# Patient Record
Sex: Female | Born: 1960 | Race: White | Hispanic: No | Marital: Single | Smoking: Never smoker
Health system: Southern US, Community
[De-identification: ages and names within clinical notes are randomized; demographics above are authoritative.]

## PROBLEM LIST (undated history)

## (undated) DIAGNOSIS — F1021 Alcohol dependence, in remission: Secondary | ICD-10-CM

## (undated) DIAGNOSIS — E46 Unspecified protein-calorie malnutrition: Secondary | ICD-10-CM

## (undated) DIAGNOSIS — K5190 Ulcerative colitis, unspecified, without complications: Secondary | ICD-10-CM

## (undated) DIAGNOSIS — M810 Age-related osteoporosis without current pathological fracture: Secondary | ICD-10-CM

## (undated) HISTORY — PX: BREAST BIOPSY: SHX20

---

## 1991-11-15 HISTORY — PX: COLONOSCOPY: SHX5424

## 1992-01-23 ENCOUNTER — Encounter: Payer: Self-pay | Admitting: Internal Medicine

## 1992-01-25 ENCOUNTER — Encounter: Payer: Self-pay | Admitting: Internal Medicine

## 2004-11-22 ENCOUNTER — Ambulatory Visit: Payer: Self-pay | Admitting: Radiation Oncology

## 2004-12-15 ENCOUNTER — Ambulatory Visit: Payer: Self-pay | Admitting: Radiation Oncology

## 2005-01-07 ENCOUNTER — Ambulatory Visit: Payer: Self-pay

## 2005-04-08 ENCOUNTER — Ambulatory Visit: Payer: Self-pay | Admitting: General Surgery

## 2005-09-26 ENCOUNTER — Ambulatory Visit: Payer: Self-pay

## 2005-12-12 ENCOUNTER — Ambulatory Visit: Payer: Self-pay | Admitting: Internal Medicine

## 2006-02-08 ENCOUNTER — Ambulatory Visit: Payer: Self-pay | Admitting: General Surgery

## 2006-09-08 ENCOUNTER — Ambulatory Visit: Payer: Self-pay | Admitting: Internal Medicine

## 2006-09-29 ENCOUNTER — Ambulatory Visit: Payer: Self-pay | Admitting: Internal Medicine

## 2006-11-06 ENCOUNTER — Ambulatory Visit: Payer: Self-pay | Admitting: General Surgery

## 2006-12-14 ENCOUNTER — Ambulatory Visit: Payer: Self-pay | Admitting: Internal Medicine

## 2007-01-08 ENCOUNTER — Ambulatory Visit: Payer: Self-pay | Admitting: Internal Medicine

## 2007-01-12 ENCOUNTER — Ambulatory Visit: Payer: Self-pay | Admitting: Endocrinology

## 2007-01-26 ENCOUNTER — Ambulatory Visit: Payer: Self-pay | Admitting: Internal Medicine

## 2007-01-28 IMAGING — CT CT PARANASAL SINUSES W/O CM
1 series · 16 of 28 positions shown, 20 images · non-contrast
Comparison: none

REASON FOR EXAM: SINUSITIS
COMMENTS:

[Series 2: sinus · axial · 0.30mm/px · z∈[-51,+71]mm · 16 of 28 slices shown, 20 images]
[im 2/28  brain]
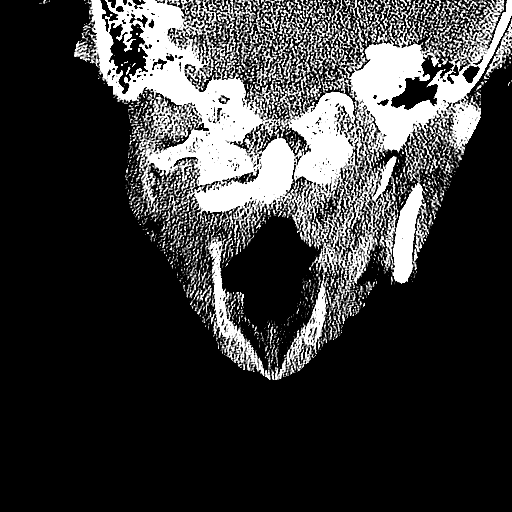
[im 2/28  bone]
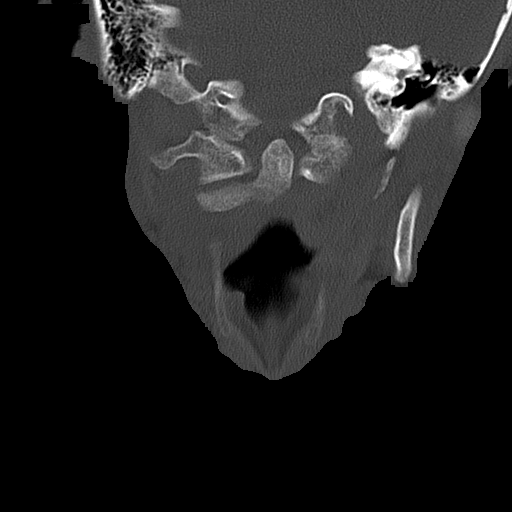
[im 4/28  bone]
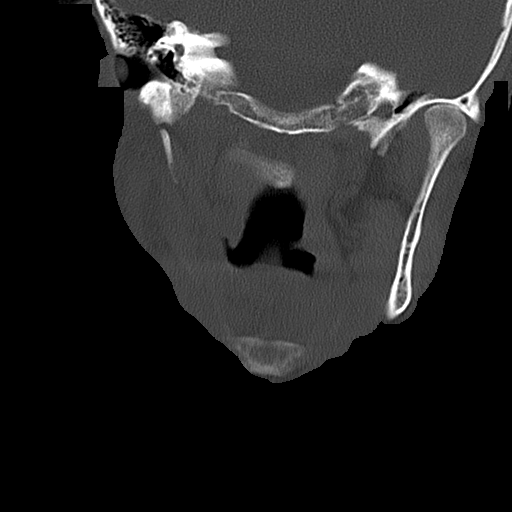
[im 6/28  bone]
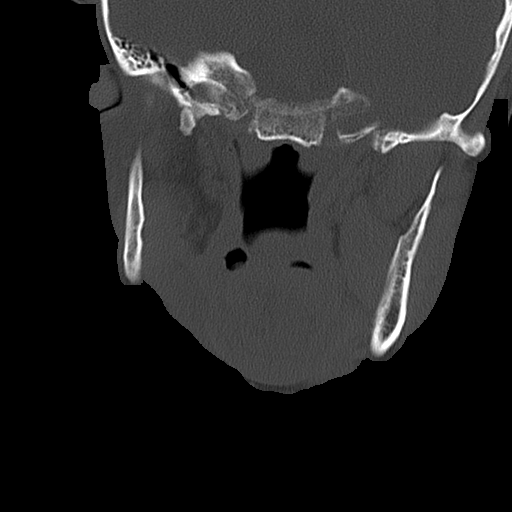
[im 7/28  bone]
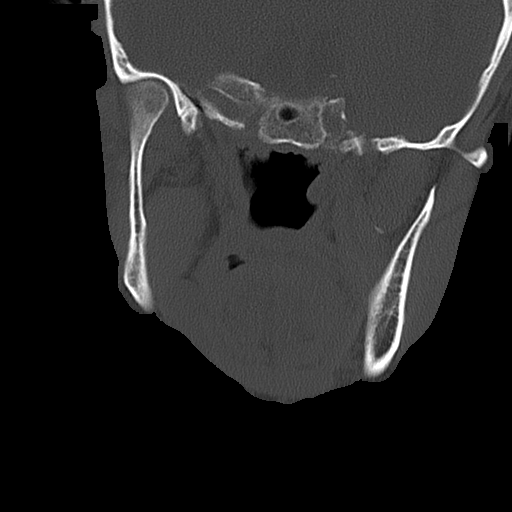
[im 9/28  brain]
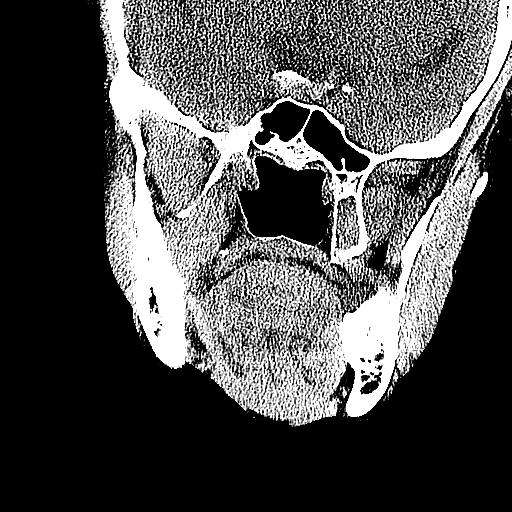
[im 9/28  bone]
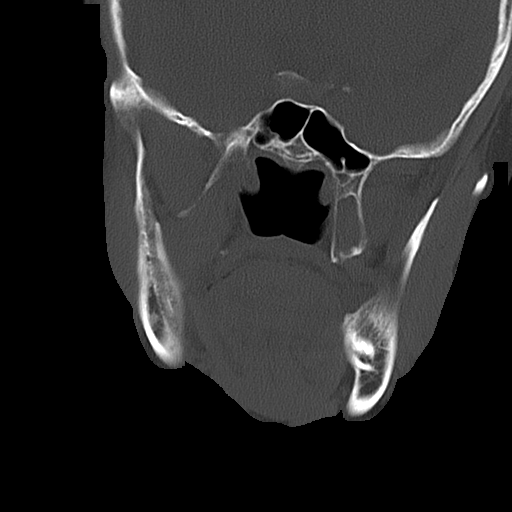
[im 10/28  bone]
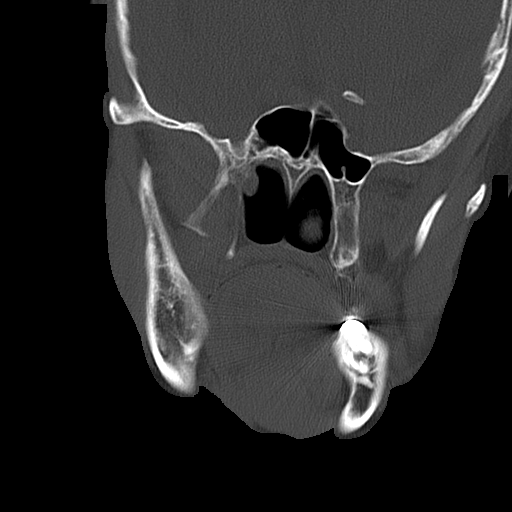
[im 12/28  bone]
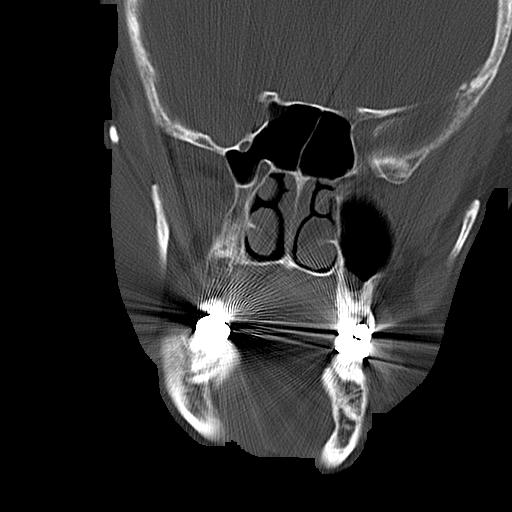
[im 14/28  bone]
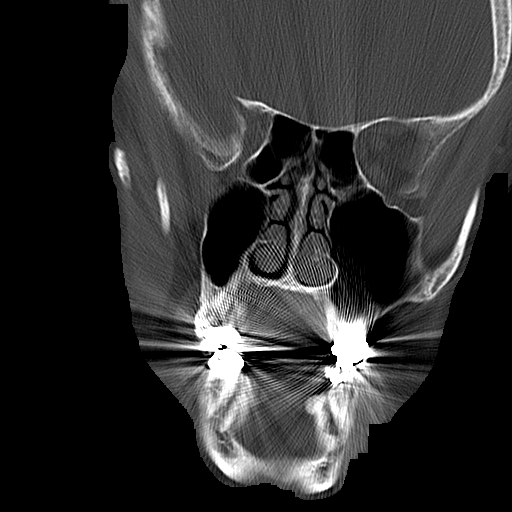
[im 15/28  brain]
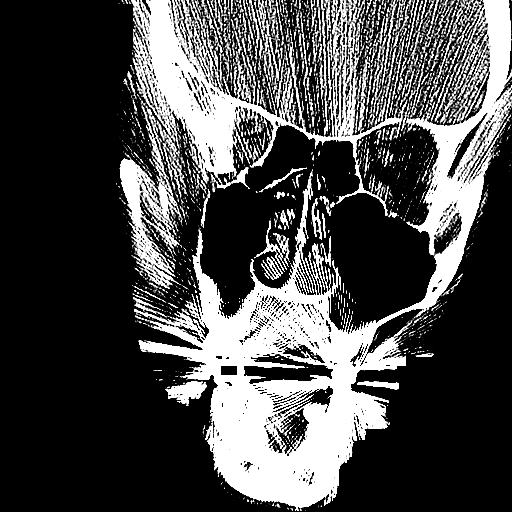
[im 15/28  bone]
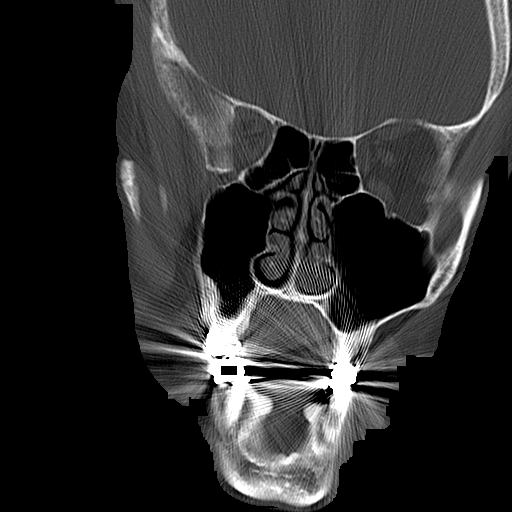
[im 17/28  bone]
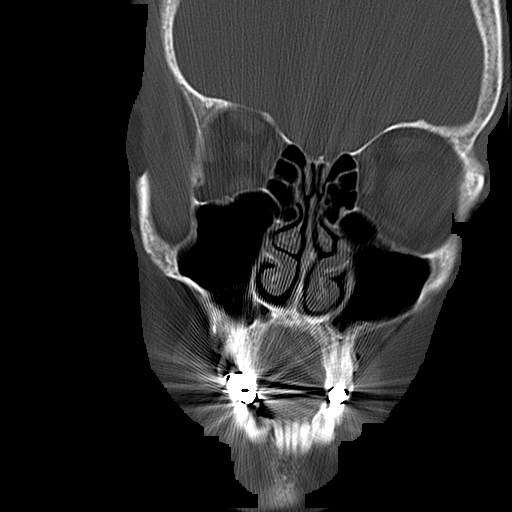
[im 19/28  bone]
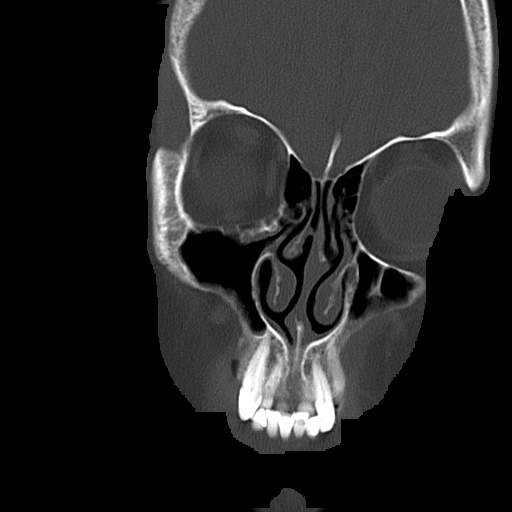
[im 20/28  bone]
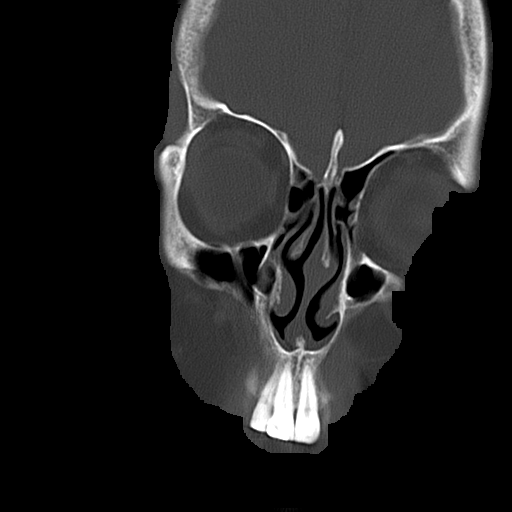
[im 22/28  brain]
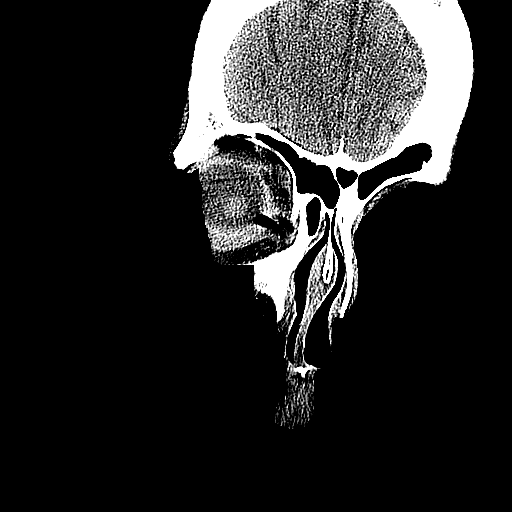
[im 22/28  bone]
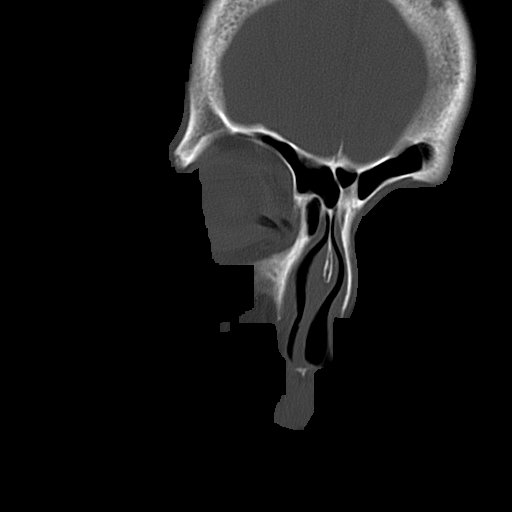
[im 23/28  bone]
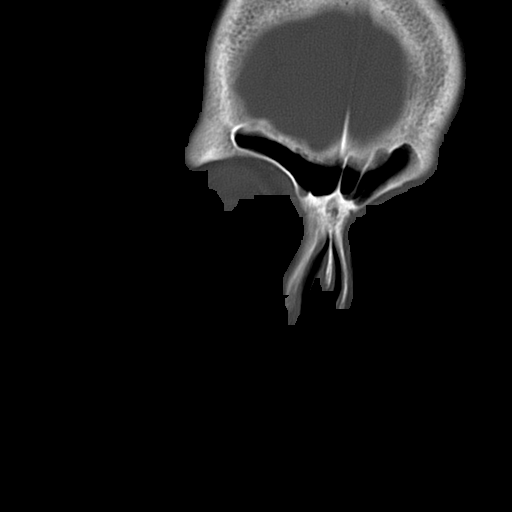
[im 25/28  bone]
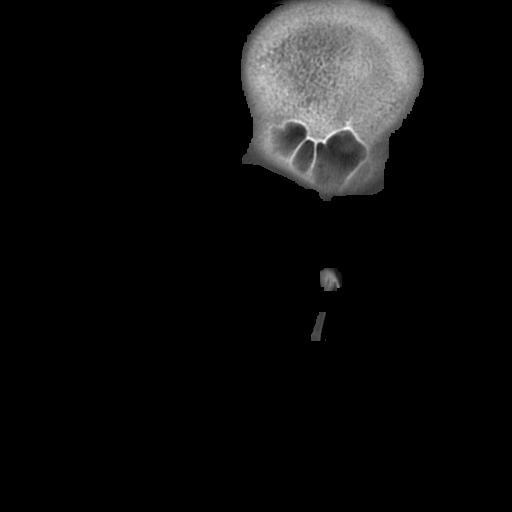
[im 27/28  bone]
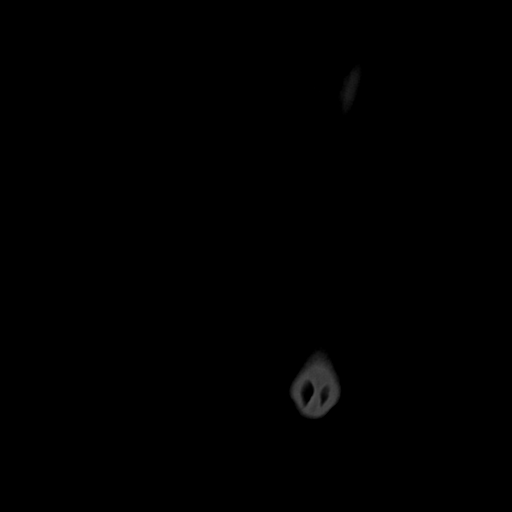

[16 of 28 positions shown; findings below may reference images not displayed]

PROCEDURE:     CT  - CT SINUSES WITHOUT CONTRAST  - January 07, 2005 [DATE]

RESULT:     Direct coronal CT imaging of the paranasal sinuses demonstrates
the ostiomeatal complexes are normally formed and patent bilaterally.  No
air-fluid levels are seen.  Nasal septum approximates midline.  No bony
destruction or focal intrasinus soft tissue density is seen.  There is some
beam-hardening artifact from the patient's dental work.
IMPRESSION: No CT evidence of sinusitis.

No bony destruction.

No air-fluid levels or mucoperiosteal thickening seen.

## 2007-02-13 ENCOUNTER — Ambulatory Visit: Payer: Self-pay | Admitting: Internal Medicine

## 2007-03-15 ENCOUNTER — Ambulatory Visit: Payer: Self-pay | Admitting: Internal Medicine

## 2007-05-15 ENCOUNTER — Ambulatory Visit: Payer: Self-pay | Admitting: Internal Medicine

## 2007-05-25 ENCOUNTER — Ambulatory Visit: Payer: Self-pay | Admitting: Internal Medicine

## 2007-06-15 ENCOUNTER — Ambulatory Visit: Payer: Self-pay | Admitting: Internal Medicine

## 2007-07-20 ENCOUNTER — Ambulatory Visit: Payer: Self-pay | Admitting: Internal Medicine

## 2007-08-17 ENCOUNTER — Encounter: Admission: RE | Admit: 2007-08-17 | Discharge: 2007-08-17 | Payer: Self-pay | Admitting: Internal Medicine

## 2008-01-13 ENCOUNTER — Ambulatory Visit: Payer: Self-pay | Admitting: Internal Medicine

## 2008-01-18 ENCOUNTER — Ambulatory Visit: Payer: Self-pay | Admitting: Internal Medicine

## 2008-01-25 ENCOUNTER — Ambulatory Visit: Payer: Self-pay | Admitting: Endocrinology

## 2008-01-25 DIAGNOSIS — D509 Iron deficiency anemia, unspecified: Secondary | ICD-10-CM | POA: Insufficient documentation

## 2008-01-25 DIAGNOSIS — E279 Disorder of adrenal gland, unspecified: Secondary | ICD-10-CM | POA: Insufficient documentation

## 2008-01-25 DIAGNOSIS — M2550 Pain in unspecified joint: Secondary | ICD-10-CM | POA: Insufficient documentation

## 2008-01-25 DIAGNOSIS — M1990 Unspecified osteoarthritis, unspecified site: Secondary | ICD-10-CM | POA: Insufficient documentation

## 2008-01-25 DIAGNOSIS — K5010 Crohn's disease of large intestine without complications: Secondary | ICD-10-CM | POA: Insufficient documentation

## 2008-01-25 DIAGNOSIS — M810 Age-related osteoporosis without current pathological fracture: Secondary | ICD-10-CM | POA: Insufficient documentation

## 2008-01-27 ENCOUNTER — Ambulatory Visit: Payer: Self-pay | Admitting: Family Medicine

## 2008-02-01 ENCOUNTER — Ambulatory Visit: Payer: Self-pay | Admitting: Internal Medicine

## 2008-02-08 DIAGNOSIS — K5289 Other specified noninfective gastroenteritis and colitis: Secondary | ICD-10-CM

## 2008-02-08 DIAGNOSIS — J309 Allergic rhinitis, unspecified: Secondary | ICD-10-CM | POA: Insufficient documentation

## 2008-02-08 DIAGNOSIS — Z8719 Personal history of other diseases of the digestive system: Secondary | ICD-10-CM | POA: Insufficient documentation

## 2008-02-08 DIAGNOSIS — B029 Zoster without complications: Secondary | ICD-10-CM | POA: Insufficient documentation

## 2008-02-08 DIAGNOSIS — K5909 Other constipation: Secondary | ICD-10-CM | POA: Insufficient documentation

## 2008-02-08 DIAGNOSIS — G43909 Migraine, unspecified, not intractable, without status migrainosus: Secondary | ICD-10-CM | POA: Insufficient documentation

## 2008-02-13 ENCOUNTER — Ambulatory Visit: Payer: Self-pay | Admitting: Internal Medicine

## 2008-06-10 ENCOUNTER — Ambulatory Visit: Payer: Self-pay | Admitting: Internal Medicine

## 2008-06-14 ENCOUNTER — Ambulatory Visit: Payer: Self-pay | Admitting: Internal Medicine

## 2008-08-14 ENCOUNTER — Ambulatory Visit: Payer: Self-pay | Admitting: Internal Medicine

## 2008-09-01 ENCOUNTER — Ambulatory Visit: Payer: Self-pay | Admitting: Internal Medicine

## 2008-09-14 ENCOUNTER — Ambulatory Visit: Payer: Self-pay | Admitting: Internal Medicine

## 2008-10-01 ENCOUNTER — Ambulatory Visit: Payer: Self-pay | Admitting: Internal Medicine

## 2008-10-25 ENCOUNTER — Ambulatory Visit: Payer: Self-pay | Admitting: Family Medicine

## 2008-11-26 ENCOUNTER — Ambulatory Visit: Payer: Self-pay | Admitting: Family Medicine

## 2009-01-01 ENCOUNTER — Ambulatory Visit: Payer: Self-pay | Admitting: Urology

## 2009-01-31 ENCOUNTER — Ambulatory Visit: Payer: Self-pay | Admitting: Family Medicine

## 2009-03-02 ENCOUNTER — Ambulatory Visit: Payer: Self-pay | Admitting: Internal Medicine

## 2009-12-15 ENCOUNTER — Ambulatory Visit: Payer: Self-pay | Admitting: Internal Medicine

## 2009-12-31 ENCOUNTER — Ambulatory Visit: Payer: Self-pay | Admitting: Internal Medicine

## 2009-12-31 ENCOUNTER — Ambulatory Visit: Payer: Self-pay | Admitting: Family Medicine

## 2010-01-05 ENCOUNTER — Ambulatory Visit: Payer: Self-pay | Admitting: Family Medicine

## 2010-01-08 ENCOUNTER — Ambulatory Visit: Payer: Self-pay | Admitting: Family Medicine

## 2010-01-12 ENCOUNTER — Ambulatory Visit: Payer: Self-pay | Admitting: Internal Medicine

## 2010-01-18 ENCOUNTER — Ambulatory Visit: Payer: Self-pay | Admitting: Internal Medicine

## 2010-01-25 ENCOUNTER — Telehealth: Payer: Self-pay | Admitting: Internal Medicine

## 2010-01-25 ENCOUNTER — Encounter: Payer: Self-pay | Admitting: Cardiovascular Disease

## 2010-01-28 ENCOUNTER — Ambulatory Visit: Payer: Self-pay | Admitting: Family Medicine

## 2010-02-01 ENCOUNTER — Ambulatory Visit: Payer: Self-pay | Admitting: Family Medicine

## 2010-02-02 ENCOUNTER — Encounter: Payer: Self-pay | Admitting: Cardiovascular Disease

## 2010-02-06 ENCOUNTER — Ambulatory Visit: Payer: Self-pay | Admitting: Family Medicine

## 2010-02-07 ENCOUNTER — Ambulatory Visit: Payer: Self-pay | Admitting: Family Medicine

## 2010-02-07 DIAGNOSIS — Z8679 Personal history of other diseases of the circulatory system: Secondary | ICD-10-CM | POA: Insufficient documentation

## 2010-02-07 DIAGNOSIS — R002 Palpitations: Secondary | ICD-10-CM

## 2010-02-08 ENCOUNTER — Encounter: Payer: Self-pay | Admitting: Family Medicine

## 2010-02-10 ENCOUNTER — Encounter: Payer: Self-pay | Admitting: Family Medicine

## 2010-02-17 ENCOUNTER — Ambulatory Visit: Payer: Self-pay | Admitting: Family Medicine

## 2010-02-17 DIAGNOSIS — E278 Other specified disorders of adrenal gland: Secondary | ICD-10-CM | POA: Insufficient documentation

## 2010-02-18 ENCOUNTER — Ambulatory Visit: Payer: Self-pay | Admitting: Family Medicine

## 2010-02-18 ENCOUNTER — Encounter: Payer: Self-pay | Admitting: Family Medicine

## 2010-02-19 ENCOUNTER — Ambulatory Visit: Payer: Self-pay | Admitting: Internal Medicine

## 2010-02-19 ENCOUNTER — Ambulatory Visit: Payer: Self-pay | Admitting: Family Medicine

## 2010-02-22 ENCOUNTER — Ambulatory Visit: Payer: Self-pay | Admitting: Family Medicine

## 2010-06-14 ENCOUNTER — Ambulatory Visit: Payer: Self-pay | Admitting: Family Medicine

## 2010-06-14 DIAGNOSIS — E2749 Other adrenocortical insufficiency: Secondary | ICD-10-CM

## 2010-06-19 ENCOUNTER — Ambulatory Visit: Payer: Self-pay | Admitting: Family Medicine

## 2010-06-24 ENCOUNTER — Encounter (INDEPENDENT_AMBULATORY_CARE_PROVIDER_SITE_OTHER): Payer: Self-pay | Admitting: *Deleted

## 2010-07-31 ENCOUNTER — Ambulatory Visit: Payer: Self-pay | Admitting: Family Medicine

## 2010-07-31 DIAGNOSIS — J209 Acute bronchitis, unspecified: Secondary | ICD-10-CM | POA: Insufficient documentation

## 2010-07-31 DIAGNOSIS — J45909 Unspecified asthma, uncomplicated: Secondary | ICD-10-CM | POA: Insufficient documentation

## 2010-08-01 ENCOUNTER — Ambulatory Visit: Payer: Self-pay | Admitting: Family Medicine

## 2010-08-02 ENCOUNTER — Ambulatory Visit: Payer: Self-pay | Admitting: Family Medicine

## 2010-08-28 ENCOUNTER — Ambulatory Visit: Payer: Self-pay | Admitting: Family Medicine

## 2010-10-10 ENCOUNTER — Ambulatory Visit: Payer: Self-pay | Admitting: Family Medicine

## 2010-10-31 ENCOUNTER — Ambulatory Visit: Payer: Self-pay | Admitting: Family Medicine

## 2010-10-31 DIAGNOSIS — R05 Cough: Secondary | ICD-10-CM

## 2010-11-28 ENCOUNTER — Ambulatory Visit
Admission: RE | Admit: 2010-11-28 | Discharge: 2010-11-28 | Payer: Self-pay | Source: Home / Self Care | Attending: Internal Medicine | Admitting: Internal Medicine

## 2010-11-28 DIAGNOSIS — T7840XA Allergy, unspecified, initial encounter: Secondary | ICD-10-CM | POA: Insufficient documentation

## 2010-12-06 ENCOUNTER — Encounter: Payer: Self-pay | Admitting: Internal Medicine

## 2010-12-08 ENCOUNTER — Ambulatory Visit: Payer: Self-pay | Admitting: Family Medicine

## 2010-12-11 ENCOUNTER — Encounter: Payer: Self-pay | Admitting: Family Medicine

## 2010-12-11 ENCOUNTER — Ambulatory Visit
Admission: RE | Admit: 2010-12-11 | Discharge: 2010-12-11 | Payer: Self-pay | Source: Home / Self Care | Attending: Family Medicine | Admitting: Family Medicine

## 2010-12-12 ENCOUNTER — Encounter: Payer: Self-pay | Admitting: Family Medicine

## 2010-12-12 ENCOUNTER — Ambulatory Visit
Admission: RE | Admit: 2010-12-12 | Discharge: 2010-12-12 | Payer: Self-pay | Source: Home / Self Care | Attending: Family Medicine | Admitting: Family Medicine

## 2010-12-13 ENCOUNTER — Encounter: Payer: Self-pay | Admitting: Family Medicine

## 2010-12-15 NOTE — Letter (Signed)
Summary: Colonoscopy Letter  [facility]  [address] [place], [place] [address]   Phone: [phone]  Fax: [phone]      June 24, 2010 MRN: [id number]   [name] [name] [address] [place], [place]  [address]   Dear [name],   According to your medical record, it is time for you to schedule a Colonoscopy. The American Cancer Society recommends this procedure as a method to detect early colon cancer. Patients with a family history of colon cancer, or a personal history of colon polyps or inflammatory bowel disease are at increased risk.  This letter has beeen generated based on the recommendations made at the time of your procedure. If you feel that in your particular situation this may no longer apply, please contact our office.  Please call our office at [phone] to schedule this appointment or to update your records at your earliest convenience.  Thank you for cooperating with [place] to provide you with the very best care possible.   Sincerely,  Hedwig Morton. Juanda Chance, M.D.  [facility] [phone]

## 2010-12-15 NOTE — Assessment & Plan Note (Signed)
Summary: SINUS HEADACHE, TIRED AND WEAK/EVM   Vital Signs:  Patient Profile:   50 Years Old Female CC:      Headache, Sore Throat. / rwt Height:     66 inches Weight:      103 pounds O2 Sat:      100 % O2 treatment:    Room Air Temp:     97.6 degrees F oral Pulse rate:   86 / minute Pulse rhythm:   regular Resp:     18 per minute BP sitting:   132 / 77  (right arm)  Pt. in pain?   yes    Location:   head    Intensity:   6    Type:       aching  Vitals Entered By: Levonne Spiller EMT-P (June 14, 2010 4:38 PM)              Is Patient Diabetic? No      Prior Medication List:  FAMVIR 250 MG  TABS (FAMCICLOVIR) TAKE 1 by mouth QD NASACORT AQ 55 MCG/ACT  AERS (TRIAMCINOLONE ACETONIDE(NASAL)) USE PRN FERROUS SULFATE 325 (65 FE) MG  TABS (FERROUS SULFATE) TAKE 1 by mouth QD FISH OIL CONCENTRATE 300 MG  CAPS (OMEGA-3 FATTY ACIDS) TAKE 1 by mouth QD ULTRAM 50 MG  TABS (TRAMADOL HCL) TAKE 1 by mouth once daily PRN ALBUTEROL 90 MCG/ACT  AERS (ALBUTEROL) USE PRN * PTH AND CA++  555.1  PREDNISONE 5 MG TABS (PREDNISONE) Take 1/2 tablet two times a day OMNARIS 50 MCG/ACT SUSP (CICLESONIDE) 2 puffs am and 2 puffs in pm SUDAFED 30 MG TABS (PSEUDOEPHEDRINE HCL) as directed MUCINEX 600 MG XR12H-TAB (GUAIFENESIN) as directed AZITHROMYCIN 500 MG TABS (AZITHROMYCIN) sig 1 by mouth q day x5 days ROCEPHIN 1 GM SOLR (CEFTRIAXONE SODIUM) sig 1Gm IM x 3 days ROCEPHIN 1 GM SOLR (CEFTRIAXONE SODIUM) sig 1 gram IM up to 3 days ROCEPHIN 1 GM SOLR (CEFTRIAXONE SODIUM) 1 gram IM q day for 3 d ays ROCEPHIN 1 GM SOLR (CEFTRIAXONE SODIUM) sig 1 gram IM x 3 days * IM ROCEPHIN NEEDED PATIENT HAS BEEN DX W/PNEUMONIA AT [facility] VIA CT-SCAN. SHE WAS SEEN ON 03/26&3/27 AND GIVEN A GRAM OF ROCEPHIN. SHE NEDS A THIRD INJECTION OF ROCEPHIN FOR TREATMENT. TERAZOL 3 80 MG SUPP (TERCONAZOLE) SIG 1 PER VAGINA AT NIGHT X 3 NIGHTS DIFLUCAN 150 MG TABS (FLUCONAZOLE) once daily and repeat in 1 wek if needed TERAZOL 3 80 MG  SUPP (TERCONAZOLE) sig 1 per vagina at bedtime for 3 nights VALIUM 10 MG TABS (DIAZEPAM) sig 1/2 to 1 by mouth if trouble sleeping OMNARIS 50 MCG/ACT SUSP (CICLESONIDE) as needed SOMA 350 MG TABS (CARISOPRODOL) take one by mouth three times a day   Current Allergies (reviewed today): ! ERYTHROMYCIN ! LEVAQUIN SEPTRA (SULFAMETHOXAZOLE-TRIMETHOPRIM) AUGMENTIN (AMOXICILLIN-POT CLAVULANATE) BIAXIN (CLARITHROMYCIN) CIPRO (CIPROFLOXACIN)History of Present Illness Chief Complaint: Headache, Sore Throat. 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 Last week she was placed from a one vent exposure to a 2 vent exposure. Her sinuses have been aggrevated. 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) UNSPECIFIED DISORDER OF ADRENAL GLANDS (  ICD-255.9) POLYARTHRALGIA (ICD-719.49) ANEMIA, IRON DEFICIENCY (ICD-280.9) CROHN'S DISEASE, 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) OSTEOPOROSIS (ICD-733.00) OSTEOARTHRITIS (ICD-715.90)   Current Meds FAMVIR 250 MG  TABS (FAMCICLOVIR) TAKE 1 by mouth QD NASACORT AQ 55 MCG/ACT  AERS (TRIAMCINOLONE ACETONIDE(NASAL)) USE PRN FERROUS SULFATE 325 (65 FE) MG  TABS (FERROUS SULFATE) TAKE 1 by mouth QD FISH OIL CONCENTRATE 300 MG  CAPS (OMEGA-3 FATTY ACIDS) TAKE 1 by mouth QD ULTRAM 50 MG  TABS (TRAMADOL HCL) TAKE 1 by mouth once daily PRN ALBUTEROL 90 MCG/ACT  AERS (ALBUTEROL) USE PRN * PTH AND CA++  555.1  PREDNISONE 5 MG TABS (PREDNISONE) Take 1/2  tablet two times a day OMNARIS 50 MCG/ACT SUSP (CICLESONIDE) 2 puffs am and 2 puffs in pm SUDAFED 30 MG TABS (PSEUDOEPHEDRINE HCL) as directed MUCINEX 600 MG XR12H-TAB (GUAIFENESIN) as directed AZITHROMYCIN 500 MG TABS (AZITHROMYCIN) sig 1 by mouth q day x5 days ROCEPHIN 1 GM SOLR (CEFTRIAXONE SODIUM) sig 1Gm IM x 3 days ROCEPHIN 1 GM SOLR (CEFTRIAXONE SODIUM) sig 1 gram IM up to 3 days ROCEPHIN 1 GM SOLR (CEFTRIAXONE SODIUM) 1 gram IM q day for 3 d ays ROCEPHIN 1 GM SOLR (CEFTRIAXONE SODIUM) sig 1 gram IM x 3 days * IM ROCEPHIN NEEDED PATIENT HAS BEEN DX W/PNEUMONIA AT [facility] VIA CT-SCAN. SHE WAS SEEN ON 03/26&3/27 AND GIVEN A GRAM OF ROCEPHIN. SHE NEDS A THIRD INJECTION OF ROCEPHIN FOR TREATMENT. TERAZOL 3 80 MG SUPP (TERCONAZOLE) SIG 1 PER VAGINA AT NIGHT X 3 NIGHTS DIFLUCAN 150 MG TABS (FLUCONAZOLE) once daily and repeat in 1 wek if needed TERAZOL 3 80 MG SUPP (TERCONAZOLE) sig 1 per vagina at bedtime for 3 nights VALIUM 10 MG TABS (DIAZEPAM) sig 1/2 to 1 by mouth if trouble sleeping OMNARIS 50 MCG/ACT SUSP (CICLESONIDE) as needed SOMA 350 MG TABS (CARISOPRODOL) take one by mouth three times a day  REVIEW OF SYSTEMS Constitutional Symptoms       Complains of fever, chills, night sweats, and fatigue.     Denies weight loss and weight gain.  Eyes       Complains of change in vision.      Comments: sty on her eye Ear/Nose/Throat/Mouth       Complains of dizziness.      Denies hearing loss/aids and change in hearing.  Respiratory       Complains of dry cough, shortness of breath, and asthma.      Denies wheezing, bronchitis, and emphysema/COPD.  Cardiovascular       Complains of chest pain and tires easily with exhertion.      Denies murmurs.    Gastrointestinal       Complains of stomach pain, nausea/vomiting, diarrhea, and indigestion.      Denies constipation and blood in bowel movements. Genitourniary       Complains of painful urination.      Denies kidney stones.      Comments: hx  of herpes exacerbation Neurological       Complains of numbness, tingling, and weakness. Musculoskeletal       Complains of muscle pain, joint pain, joint stiffness, and muscle weakness.   Psych       Complains of anxiety/stress, depression, and sleep problems. Blood-Lymph       Complains of easily bruises or bleeds.  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  Regular Exercise - yes  Risk Factors: Exercise: yes (02/02/2010)  Risk Factors: Smoking Status: never (02/02/2010)  Family History: Reviewed history from 02/02/2010 and no changes required. Family History of Breast Cancer: Aunt No FH of Colon Cancer: Father: living: 11 dementia, hypothyroidism Mother: living 44: no major illnesses are known  Social History: Reviewed history from 02/02/2010 and no changes required. Alcohol Use - no Illicit Drug Use - no Full Time Single  Tobacco Use - No.  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 She reports the best that she felt was when she was got the solumedrol dose in April over 3 days. Because she reacted to 40 mg we will give her 3 injections of 20 mg over 3 days .    Plan New Orders: Est. Patient Level IV [16109] Solumedrol up to 40mg  [J2920] Planning Comments:   Also as part of her follow up we will reevaluate her on Sunday. We will try her on the Voltaren patch 1/8 to 1/4 a patch daily and 2 patch samples given. Thes was after she rteported taking 1/8 th of her brother lodine helped and did not make her too sleepy or caused GUI irritation. Mobic and celebrex both caused CNS problems.   The patient and/or caregiver has been counseled thoroughly with regard to medications prescribed including dosage, schedule, interactions, rationale for use, and possible side effects and they verbalize understanding.  Diagnoses and expected course of recovery discussed and will return if not improved as expected or if the condition worsens. Patient and/or caregiver verbalized understanding.   Patient Instructions: 1)  solumedrol 20 mg IM x 3 days and 1/4 t0 1/8th voltaren patch 2)  folow up w/me on Sunday 3)  Patient given verbal instructions and agrees.  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 Patient Level IV [60454] 2)  Solumedrol up to 40mg  [J2920]

## 2010-12-15 NOTE — Progress Notes (Signed)
Summary: TRIAGE   Phone Note Call from Patient Call back at Home Phone [phone]   Call For: Dr Juanda Chance Reason for Call: Talk to Nurse Summary of Call: Question about acid relux and the complications it can have on her asthma. Initial call taken by: Leanor Kail [facility],  January 25, 2010 10:09 AM  Follow-up for Phone Call        Last OV was 07-20-2007. Pt. c/o upper resp. infection/flu in the last month and she was given "alot of antibiotics" thinks she is getting acid reflux.  Was given Omeprazole, using it daily. Would like to see Dr.Lavine Hargrove sooner than 1st available, but only a Friday.   1) See Dr.Mickel Schreur on 02-05-10 at 2:30pm 2) Continue Omeprazole as directed 3) Soft,bland diet. No spicy,greasy,fried foods.  4) If symptoms become worse call back immediately. Follow-up by: Laureen Ochs LPN,  January 25, 2010 10:35 AM

## 2010-12-15 NOTE — Letter (Signed)
Summary: *CRML  [facility]  [address]   [place], [place] [address]   Phone: [phone]  Fax: [phone]       Patient Name: [name] [name] DOB: 1961/07/03     To whom it may concern [name] has beeen one of my payients for several years. She has seen multiple specialist for her variious medical problems. One of her key problem is reactive airway DX that manifest itself w/acute bronchospasm and difficulty for her to breathe. Two key triggers that we have found is noxious fumes ie cigarette smoke and paint fumes, and cold air thay is produce by [facility] vents and air ducts.   I stronly feel for Ms Cope to be able to function please provider her a work enviroment free of those things. It is a matter of life and breath.  Sincerely,  Hassan Rowan MD

## 2010-12-15 NOTE — Assessment & Plan Note (Signed)
Summary: DIZZY AND THROAT SCRATCHY/EVM   Vital Signs:  Patient profile:   50 year old female Height:      66 inches Weight:      99 pounds O2 Sat:      100 % on Room air Temp:     97.7 degrees F oral Pulse rate:   70 / minute Pulse rhythm:   regular Resp:     18 per minute BP sitting:   117 / 64  (right arm) Cuff size:   regular  Vitals Entered By: Providence Crosby LPN (October 10, 2010 3:32 PM)  O2 Flow:  Room air  Visit Type:  dizzy and scratchy throat   History of Present Illness: dizziness started wednesday 10/06/2010 / aching legs shoulders back of neck ;complains of sinus problems also; dry hacking cough. Had extensive discussion w/Ms Uvaldo Rising. she has noted that since we gave her multiple steroids shots in September it made her feel much better. since then she has had more injections her life and abitity to fend off a general malaise has improved. She basicly now feels drained. Reiterating when she takes extra hydocortise by mouth she has side effects. Also taking a full 80 mg of depemedrol causes side effects. She is able to tolerate 20mg  at a time. For a 1ml 80mg  vial she can get 4 injections. This is a small amount .25ml that either she or her mother could give IM in her thigh if she makes sure she moves the site around and usesd good technique.  We eil give .25ml today and dispense the resyt of the vial or.75ml to administer later at home.   Allergies: 1)  ! Erythromycin 2)  ! Levaquin 3)  Septra 4)  Augmentin (Amoxicillin-Pot Clavulanate) 5)  Biaxin (Clarithromycin) 6)  Cipro (Ciprofloxacin)   Impression & Recommendations:  Problem # 1:  reactive air way DX Assessment Unchanged  Problem # 2:  adrenak suppresion Assessment: Unchanged  Medications Added to Medication List This Visit: 1)  Ultram 50 Mg Tabs (Tramadol hcl) .... Take 1 by mouth once daily as needed patient takes 1/2 tablet if needed  Other Orders: Admin of Therapeutic Inj  intramuscular or  subcutaneous (60454) Physical Exam General appearance: thin white female Head: normocephalic, atraumatic Neck: neck supple,  trachea midline, no masses Chest/Lungs: actively coughing lungs are cklear at this time Heart: regular rate and  rhythm, no murmur Skin: no obvious rashes or lesions MSE: oriented to time, place, and person   Patient Instructions: 1)  As before will have here folllow up in 3 weeks. Up to 3 injections of depomedrol over the next 3 weeks in addition to the one given earlier.   Medication Administration  Injection # 1:    Medication: depo- Medrol 20mg     Diagnosis: ? of UPPER RESPIRATORY INFECTION (ICD-465.9)    Route: IM    Site: LUOQ gluteus    Exp Date: 02/2011    Lot #: obput    Mfr: pfizer    Comments: 1/4 of a vial 0.30ml    Patient tolerated injection without complications    Given by: Providence Crosby LPN (October 10, 2010 4:50 PM)  Orders Added: 1)  New Patient Level IV [99204] 2)  Admin of Therapeutic Inj  intramuscular or subcutaneous [96372]     Medication Administration  Injection # 1:    Medication: depo- Medrol 20mg     Diagnosis: ? of UPPER RESPIRATORY INFECTION (ICD-465.9)    Route: IM    Site: LUOQ  gluteus    Exp Date: 02/2011    Lot #: obput    Mfr: pfizer    Comments: 1/4 of a vial 0.34ml    Patient tolerated injection without complications    Given by: Providence Crosby LPN (October 10, 2010 4:50 PM)  Orders Added: 1)  New Patient Level IV [99204] 2)  Admin of Therapeutic Inj  intramuscular or subcutaneous [79024]

## 2010-12-15 NOTE — Assessment & Plan Note (Signed)
Summary: COUGH, WHEEZING/EVM   Vital Signs:  Patient profile:   50 year old female Height:      66 inches Weight:      100 pounds O2 Sat:      100 % on Room air Temp:     97.1 degrees F oral Pulse rate:   100 / minute Pulse rhythm:   regular Resp:     24 per minute BP sitting:   130 / 80  (right arm) Cuff size:   regular  Vitals Entered By: Providence Crosby LPN (August 28, 2010 3:46 PM)  O2 Flow:  Room air  Visit Type:  cough Primary Care Provider:  Dr. Dan Humphreys [facility]  Chief Complaint:  cough and constant.  History of Present Illness: constant cough ; states she thinks it might be reactive airway disease because she has a history of this and was treated for this recently.  She has a PCP and pulmonologist.  She describes:; dry, tight cough since 08/26/2010, the patient reports that when she gets exposed to cold air and ventilation she reports that she starts coughing. She reports that for the past couple of days she has been coughing like this constantly. She reports that she has had several of these episodes in the past several weeks. She reports that she was diagnosed with pneumonia approximately in March of this year but does not feel similar to that. She's not having any of the pain that she was having on her chest when she had pneumonia. She reports that she doesn't seem to be able to stop the coughing and she reports that she has taken steroids in the past given by Dr. Thurmond Butts and antibiotics. She reports the Zithromax she tolerates that very well and also tolerates amoxicillin but reports that she had taken that a couple weeks ago given by her pulmonologist. The patient reports that she normally takes a series of steroid injections of small doses overs the course of 3 days. I reviewed the chart and her previous office visits and she had wheezing and congestion for those visits but today I did not hear any wheezing in her lungs were clear and her pulse ox was 100%.  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 Erythromycin 2)  ! Levaquin 3)  Septra 4)  Augmentin (Amoxicillin-Pot Clavulanate) 5)  Biaxin (Clarithromycin) 6)  Cipro (Ciprofloxacin)  Past History:  Past Medical History: Last updated: 02/02/2010 POLYARTHRALGIA (ICD-719.49) ANEMIA, IRON DEFICIENCY (ICD-280.9) CROHN'S DISEASE, 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) OSTEOPOROSIS (ICD-733.00) OSTEOARTHRITIS (ICD-715.90) Allergic rhinitis Asthma Osteopenia Uterine fibroids Genital herpes Hx: iron infusion  Past Surgical History: Last updated: 02/01/2010 Unremarkable  Family History: Last updated: 02/02/2010 Family History of Breast Cancer: Aunt No FH  of Colon Cancer: Father: living: 89 dementia, hypothyroidism Mother: living 10: no major illnesses are known  Social History: Last updated: 02/02/2010 Alcohol Use - no Illicit Drug Use - no Full Time Single  Tobacco Use - No.  Regular Exercise - yes  Risk Factors: Exercise: yes (02/02/2010)  Risk Factors: Smoking Status: never (08/28/2010)  Family History: Reviewed history from 02/02/2010 and no changes  required. Family History of Breast Cancer: Aunt No FH of Colon Cancer: Father: living: 31 dementia, hypothyroidism Mother: living 79: no major illnesses are known  Social History: Reviewed history from 02/02/2010 and no changes required. Alcohol Use - no Illicit Drug Use - no Full Time Single  Tobacco Use - No.  Regular Exercise - yes  Review of Systems  The patient denies anorexia, fever, weight loss, weight gain, vision loss, decreased hearing, hoarseness, chest pain, syncope, dyspnea on exertion, peripheral edema, prolonged cough, headaches, hemoptysis, abdominal pain, melena, hematochezia, severe indigestion/heartburn, hematuria, incontinence, genital sores, muscle weakness, suspicious skin lesions, transient blindness, difficulty walking, depression, unusual weight change, abnormal bleeding, enlarged lymph nodes, angioedema, breast masses, and testicular masses.   Resp:  Complains of cough, sputum productive, and wheezing; denies coughing up blood, hypersomnolence, and shortness of breath.  Physical Exam  General:  well developed, thin WF  Head:  Normocephalic and atraumatic without obvious abnormalities. No apparent alopecia or balding. Eyes:  nonicteric Ears:  External ear exam shows no significant lesions or deformities.  Otoscopic examination reveals clear canals, tympanic membranes are intact bilaterally without bulging, retraction, inflammation or discharge. Hearing is grossly normal bilaterally. Nose:  External nasal examination shows no deformity or inflammation. Nasal mucosa are pink and moist without lesions or exudates. Mouth:  Oral mucosa and oropharynx without lesions or exudates.  Teeth in good repair. Neck:  supple, full ROM, and no masses.   Chest Wall:  thin, no deformities, no tenderness, and no mass.  No use of accessory muscles  Lungs:  Clear throughout to auscultation.normal respiratory effort, no intercostal retractions, no accessory muscle use, normal breath  sounds, no dullness, no fremitus, no crackles, and no wheezes.   Heart:  Regular rate and rhythm; no murmurs, rubs,  or bruits. Abdomen:  Bowel sounds positive,abdomen soft and non-tender without masses, organomegaly or hernias noted. Neurologic:  No cranial nerve deficits noted. Station and gait are normal. Plantar reflexes are down-going bilaterally. DTRs are symmetrical throughout. Sensory, motor and coordinative functions appear intact. Psych:  Alert and cooperative. Normal mood and affect. Additional Exam:  Pt was coughing constantly when provider not in room but coughing completely stopped when provider came into room and when talking with the patient, no coughing at all noted.     Problems:  Medical Problems Added: 1)  ? of Upper Respiratory Infection  (ICD-465.9) 2)  Dx of Cough  (ICD-786.2)  Impression & Recommendations:  Problem # 1:  ? of UPPER RESPIRATORY INFECTION (ICD-465.9)  Her updated medication list for this problem includes:    Mucinex 600 Mg Xr12h-tab (Guaifenesin) .Marland Kitchen... As directed Pt was given a DUONEB treatment in the office today and tolerated well. Pt was evaluated serially with pulse oximetry, 100% throughout visit Pt was re-evaluated after the breathing treatment and lungs continue to be clear and improvement in coughing noted. Pt requested steroid injection but we only had high dose injections available and I didn't feel that the patient needed that at the time.  She declined to take oral steroid dose because she says she does not tolerate oral  steroids well. I prescribed patient oral azithromycin (pt requested liquid) and gave patient instructions to follow up with her PCP and pulmonologist asap.  I told pt that if her symptoms worsened or did not improve or new changes developed to go to ER and The patient verbalized clear understanding.    Problem # 2:  COUGH (ICD-786.2) Pt declined to get a chest x ray saying that she is going to get a CT chest this coming  Friday.  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 Use prn    Omnaris 50 Mcg/act Susp (Ciclesonide) .Marland Kitchen... 2 puffs am and 2 puffs in pm    Sudafed 30 Mg Tabs (Pseudoephedrine hcl) .Marland Kitchen... As directed    Omnaris 50 Mcg/act Susp (Ciclesonide) .Marland Kitchen... As needed  Complete Medication List: 1)  Famvir 250 Mg Tabs (Famciclovir) .... Take 1 by mouth qd 2)  Nasacort Aq 55 Mcg/act Aers (Triamcinolone acetonide(nasal)) .... Use prn 3)  Ferrous Sulfate 325 (65 Fe) Mg Tabs (Ferrous sulfate) .... Take 1 by mouth qd 4)  Fish Oil Concentrate 300 Mg Caps (Omega-3 fatty acids) .... Take 1 by mouth qd 5)  Ultram 50 Mg Tabs (Tramadol hcl) .... Take 1 by mouth once daily prn 6)  Albuterol 90 Mcg/act Aers (Albuterol) .... Use prn 7)  Pth and Ca++ 555.1  8)  Prednisone 5 Mg Tabs (Prednisone) .... Take 1/2 tablet two times a day 9)  Omnaris 50 Mcg/act Susp (Ciclesonide) .... 2 puffs am and 2 puffs in pm 10)  Sudafed 30 Mg Tabs (Pseudoephedrine hcl) .... As directed 11)  Mucinex 600 Mg Xr12h-tab (Guaifenesin) .... As directed 12)  Azithromycin 500 Mg Tabs (Azithromycin) .... Sig 1 by mouth q day x5 days 13)  Rocephin 1 Gm Solr (Ceftriaxone sodium) .... Sig 1gm im x 3 days 14)  Rocephin 1 Gm Solr (Ceftriaxone sodium) .... Sig 1 gram im up to 3 days 15)  Rocephin 1 Gm Solr (Ceftriaxone sodium) .Marland Kitchen.. 1 gram im q day for 3 d ays 16)  Rocephin 1 Gm Solr (Ceftriaxone sodium) .... Sig 1 gram im x 3 days 17)  Im Rocephin Needed  .... Patient has been dx w/pneumonia at [facility] via ct-scan. she was seen on 03/26&3/27 and given a gram of rocephin. she neds a third injection of rocephin for treatment. 18)  Terazol 3 80 Mg Supp (Terconazole) .... Sig 1 per vagina at night x 3 nights 19)  Diflucan 150 Mg Tabs (Fluconazole) ....  Once daily and repeat in 1 wek if needed 20)  Terazol 3 80 Mg Supp (Terconazole) .... Sig 1 per vagina at bedtime for 3 nights 21)  Valium 10 Mg Tabs (Diazepam) .... Sig 1/2 to 1 by mouth if trouble sleeping 22)  Omnaris 50 Mcg/act Susp (Ciclesonide) .... As needed 23)  Soma 350 Mg Tabs (Carisoprodol) .... Take one by mouth three times a day 24)  Diflucan 150 Mg Tabs (Fluconazole) .... Once daily 25)  Flector 1.3 % Ptch (Diclofenac epolamine) .... Soig 1/4 to 2/3 a patch qday as needed for muscle and joint pain 26)  Combivent 103-18 Mcg/act Aero (Albuterol-ipratropium) .... 2 inh. q4-6h as needed 27)  Azithromycin 200 Mg/71ml Susr (Azithromycin) .... Take 12.5 ml by mouth on day 1, then take 6.25 ml by mouth daily for 4 days 28)  Fluconazole 150 Mg Tabs (Fluconazole) .... Take 1 by mouth x 1 as needed for yeast infection  Patient Instructions: 1)  Please schedule an appointment with your primary doctor in : 2 days 2)  Go to ER if symptoms worsen or new symptoms start. 3)  Take your antibiotics as prescribed.  4)  Take your antibiotic as prescribed until ALL of it is gone, but stop if you develop a rash or swelling and contact our office as soon as possible. 5)  See your pulmonologist (lung specialist) ASAP in the next week. 6)  The patient was informed that there is no on-call provider or services available at this clinic during off-hours (when the clinic is closed).  If the patient developed a problem or concern that required immediate attention, the patient was advised to go the the nearest available urgent care or emergency department for medical care.  The patient verbalized understanding.    7)  It was clearly explained to the patient that this [facility] is not intended to be a primary care clinic.  The patient is always better served by the continuity of care and the provider/patient relationships developed with their dedicated primary care provider.  The patient was told to be sure to  follow up as soon as possible with their primary care provider to discuss treatments received and to receive further examination and testing.  The patient verbalized understanding. The will f/u with PCP ASAP.  Prescriptions: FLUCONAZOLE 150 MG TABS (FLUCONAZOLE) take 1 by mouth x 1 as needed for yeast infection  #1 x 0   Entered and Authorized by:   Standley Dakins MD   Signed by:   Standley Dakins MD on 08/28/2010   Method used:   Print then Give to Patient   RxID:   [id number] AZITHROMYCIN 200 MG/5ML SUSR (AZITHROMYCIN) take 12.5 mL by mouth on day 1, then take 6.25 mL by mouth daily for 4 days  #40 mL x 0   Entered and Authorized by:   Standley Dakins MD   Signed by:   Standley Dakins MD on 08/28/2010   Method used:   Print then Give to Patient   RxID:   [id number] AZITHROMYCIN 200 MG/5ML SUSR (AZITHROMYCIN) take 12.5 mL by mouth on day 1, then take 6.25 mL by mouth daily for 4 days  #40 mL x 0   Entered and Authorized by:   Standley Dakins MD   Signed by:   Standley Dakins MD on 08/28/2010   Method used:   Print then Give to Patient   RxID:   [id number]

## 2010-12-15 NOTE — Assessment & Plan Note (Signed)
Summary: SHORTNESSOFBREATH,SORE THROAT,HEADACHE,FATIGUE,COUGHING/JBB   Vital Signs:  Patient profile:   50 year old female Height:      66 inches Weight:      101 pounds BMI:     16.36 O2 Sat:      100 % on Room air Temp:     97.1 degrees F oral Pulse rate:   75 / minute Resp:     16 per minute BP sitting:   128 / 73  (left arm)  Vitals Entered By: Adella Hare LPN (July 31, 2010 4:50 PM)  O2 Flow:  Room air Physical Exam General appearance: ill appearing WF actively coughing Head: normocephalic, atraumatic Oral/Pharynx: vula midline without deviation Neck: supple,anterior lymphadenopathy present Chest/Lungs: scattered wheezes throughout all lobes Heart: regular rate and  rhythm, no murmur Skin: no obvious rashes or lesions MSE: oriented to time, place, and person  History of Present Illness Chief Complaint: nonproductive persistant cough, body aches History of Present Illness:    Patient has had what sounds like reactive airway DX over the last 2 weeks. She has had 2 -3 shot series of depomedrol over the last 3 weelks w/over all improvement.  Recently at work she was exposed to cold air and paint fumes and reacted on Tuesday.  Se has not been able to go to work due to the coughing  sand SOB. Things were until today when the cold wave and temperature drop retriggered her reactive airway. She has now missed several day of work w/ out leave.  We have talked about again about getting Rocephin injection today and will have her return tomorrow for folow up.   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.9) UNSPECIFIED DISORDER OF ADRENAL GLANDS (ICD-255.9) POLYARTHRALGIA (ICD-719.49) ANEMIA, IRON DEFICIENCY (ICD-280.9) CROHN'S DISEASE, 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) OSTEOPOROSIS (ICD-733.00) OSTEOARTHRITIS (ICD-715.90)   Current Meds FAMVIR 250 MG  TABS (FAMCICLOVIR) TAKE 1 by mouth QD NASACORT AQ 55 MCG/ACT  AERS (TRIAMCINOLONE ACETONIDE(NASAL)) USE PRN FERROUS SULFATE 325 (65 FE) MG  TABS (FERROUS SULFATE) TAKE 1 by mouth QD FISH OIL CONCENTRATE 300 MG  CAPS (OMEGA-3 FATTY ACIDS) TAKE 1 by mouth QD ULTRAM 50 MG  TABS (TRAMADOL HCL) TAKE 1 by mouth once daily PRN ALBUTEROL 90 MCG/ACT  AERS (ALBUTEROL) USE PRN * PTH AND CA++  555.1  PREDNISONE 5 MG TABS (PREDNISONE) Take 1/2 tablet two times a day OMNARIS 50 MCG/ACT SUSP (CICLESONIDE) 2 puffs am and 2 puffs in pm SUDAFED 30 MG TABS (PSEUDOEPHEDRINE HCL) as directed MUCINEX 600 MG XR12H-TAB (GUAIFENESIN) as directed AZITHROMYCIN 500 MG TABS (AZITHROMYCIN) sig 1 by mouth q day x5 days ROCEPHIN 1 GM SOLR (CEFTRIAXONE SODIUM) sig 1Gm IM x 3 days ROCEPHIN 1 GM SOLR (CEFTRIAXONE SODIUM) sig 1 gram IM up to 3 days ROCEPHIN 1 GM SOLR (CEFTRIAXONE SODIUM) 1 gram IM q day for 3 d ays ROCEPHIN 1 GM SOLR (CEFTRIAXONE SODIUM) sig 1 gram IM x 3 days * IM ROCEPHIN NEEDED PATIENT HAS BEEN DX W/PNEUMONIA AT [facility] VIA CT-SCAN. SHE WAS SEEN ON 03/26&3/27 AND GIVEN A GRAM OF ROCEPHIN. SHE NEDS A THIRD INJECTION OF ROCEPHIN FOR TREATMENT. TERAZOL 3 80 MG SUPP (TERCONAZOLE) SIG 1 PER VAGINA AT NIGHT X 3 NIGHTS DIFLUCAN 150 MG TABS (FLUCONAZOLE) once daily and repeat in 1  wek if needed TERAZOL 3 80 MG SUPP (TERCONAZOLE) sig 1 per vagina at bedtime for 3 nights VALIUM 10 MG TABS (DIAZEPAM) sig 1/2 to 1 by mouth if trouble sleeping OMNARIS 50 MCG/ACT SUSP (CICLESONIDE) as needed SOMA 350 MG TABS (CARISOPRODOL) take one by mouth three times a day DIFLUCAN 150 MG TABS (FLUCONAZOLE) once daily FLECTOR 1.3 % PTCH (DICLOFENAC EPOLAMINE) soig 1/4 to 2/3 a  patch qday as needed for muscle and joint pain   CC: nonproductive persistant cough, body aches Is Patient Diabetic? 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 Take 1 By Mouth Qd 2)  Nasacort Aq 55 Mcg/act  Aers (Triamcinolone Acetonide(Nasal)) .... Use Prn 3)  Ferrous Sulfate 325 (65 Fe) Mg  Tabs (Ferrous Sulfate) .... Take 1 By Mouth Qd 4)  Fish Oil Concentrate 300 Mg  Caps (Omega-3 Fatty Acids) .... Take 1 By Mouth Qd 5)  Ultram 50 Mg  Tabs (Tramadol Hcl) .... Take 1 By Mouth Once Daily Prn 6)  Albuterol 90 Mcg/act  Aers (Albuterol) .... Use Prn 7)  Pth and Ca++  555.1 8)  Prednisone 5 Mg Tabs (Prednisone) .... Take 1/2 Tablet Two Times A Day 9)  Omnaris 50 Mcg/act Susp (Ciclesonide) .... 2 Puffs Am and 2 Puffs in Pm 10)  Sudafed 30 Mg Tabs (Pseudoephedrine Hcl) .... As Directed 11)  Mucinex 600 Mg Xr12h-Tab (Guaifenesin) .... As Directed 12)  Azithromycin 500 Mg Tabs (Azithromycin) .... Sig 1 By Mouth Q Day X5 Days 13)  Rocephin 1 Gm Solr (Ceftriaxone Sodium) .... Sig 1gm Im X 3 Days 14)  Rocephin 1 Gm Solr (Ceftriaxone Sodium) .... Sig 1 Gram Im Up To 3 Days 15)  Rocephin 1 Gm Solr (Ceftriaxone  Sodium) .Marland Kitchen.. 1 Gram Im Q Day For 3 D Ays 16)  Rocephin 1 Gm Solr (Ceftriaxone Sodium) .... Sig 1 Gram Im X 3 Days 17)  Im Rocephin Needed .... Patient Has Been Dx W/pneumonia At [facility] Via Ct-Scan. She Was Seen On 03/26&3/27 and Given A Gram of Rocephin. She Neds A Third Injection of Rocephin For Treatment. 18)  Terazol 3 80 Mg Supp (Terconazole) .... Sig 1 Per Vagina At Night X 3 Nights 19)  Diflucan 150 Mg Tabs (Fluconazole) .... Once Daily and Repeat in 1 Wek If Needed 20)  Terazol 3 80 Mg Supp (Terconazole) .... Sig 1 Per Vagina At Bedtime For 3 Nights 21)  Valium 10 Mg Tabs (Diazepam) .... Sig 1/2 To 1 By Mouth If Trouble Sleeping 22)  Omnaris 50 Mcg/act Susp (Ciclesonide) .... As Needed 23)  Soma 350 Mg Tabs (Carisoprodol) .... Take One By Mouth Three Times A Day 24)  Diflucan 150 Mg Tabs (Fluconazole) .... Once Daily 25)  Flector 1.3 %  Ptch (Diclofenac Epolamine) .... Soig 1/4 To 2/3 A Patch Qday As Needed For Muscle and Joint Pain  Current Medications (verified): 1)  Famvir 250 Mg  Tabs (Famciclovir) .... Take 1 By Mouth Qd 2)  Nasacort Aq 55 Mcg/act  Aers (Triamcinolone Acetonide(Nasal)) .... Use Prn 3)  Ferrous Sulfate 325 (65 Fe) Mg  Tabs (Ferrous Sulfate) .... Take 1 By Mouth Qd 4)  Fish Oil Concentrate 300 Mg  Caps (Omega-3 Fatty Acids) .... Take 1 By Mouth Qd 5)  Ultram 50 Mg  Tabs (Tramadol Hcl) .... Take 1 By Mouth Once Daily Prn 6)  Albuterol 90 Mcg/act  Aers (Albuterol) .... Use Prn 7)  Pth and Ca++  555.1 8)  Prednisone 5 Mg Tabs (Prednisone) .... Take 1/2 Tablet Two Times A Day 9)  Omnaris 50 Mcg/act Susp (Ciclesonide) .... 2 Puffs Am and 2 Puffs in Pm 10)  Sudafed 30 Mg Tabs (Pseudoephedrine Hcl) .... As Directed 11)  Mucinex 600 Mg Xr12h-Tab (Guaifenesin) .... As Directed 12)  Azithromycin 500 Mg Tabs (Azithromycin) .... Sig 1 By Mouth Q Day X5 Days 13)  Rocephin 1 Gm Solr (Ceftriaxone Sodium) .... Sig 1gm Im X 3 Days 14)  Rocephin 1 Gm Solr (Ceftriaxone Sodium) .... Sig  1 Gram Im Up To 3 Days 15)  Rocephin 1 Gm Solr (Ceftriaxone Sodium) .Marland Kitchen.. 1 Gram Im Q Day For 3 D Ays 16)  Rocephin 1 Gm Solr (Ceftriaxone Sodium) .... Sig 1 Gram Im X 3 Days 17)  Im Rocephin Needed .... Patient Has Been Dx W/pneumonia At [facility] Via Ct-Scan. She Was Seen On 03/26&3/27 and Given A Gram of Rocephin. She Neds A Third Injection of Rocephin For Treatment. 18)  Terazol 3 80 Mg Supp (Terconazole) .... Sig 1 Per Vagina At Night X 3 Nights 19)  Diflucan 150 Mg Tabs (Fluconazole) .... Once Daily and Repeat in 1 Wek If Needed 20)  Terazol 3 80 Mg Supp (Terconazole) .... Sig 1 Per Vagina At Bedtime For 3 Nights 21)  Valium 10 Mg Tabs (Diazepam) .... Sig 1/2 To 1 By Mouth If Trouble Sleeping 22)  Omnaris 50 Mcg/act Susp (Ciclesonide) .... As Needed 23)  Soma 350 Mg Tabs (Carisoprodol) .... Take One By Mouth Three Times A Day 24)  Diflucan 150 Mg Tabs (Fluconazole) .... Once Daily 25)  Flector 1.3 % Ptch (Diclofenac Epolamine) .... Soig 1/4 To 2/3 A Patch Qday As Needed For Muscle and Joint Pain  Allergies (verified): 1)  ! Erythromycin 2)  ! Levaquin 3)  Septra 4)  Augmentin (Amoxicillin-Pot Clavulanate) 5)  Biaxin (Clarithromycin) 6)  Cipro (Ciprofloxacin)  Past History:  Past Medical History: Last updated: 02/02/2010 POLYARTHRALGIA (ICD-719.49) ANEMIA, IRON DEFICIENCY (ICD-280.9) CROHN'S DISEASE, 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) OSTEOPOROSIS (ICD-733.00) OSTEOARTHRITIS (ICD-715.90) Allergic rhinitis Asthma Osteopenia Uterine fibroids Genital herpes Hx: iron infusion  Past Surgical History: Last updated: 02/01/2010 Unremarkable  Family History: Last updated: 02/02/2010 Family History of Breast Cancer: Aunt No FH of Colon Cancer: Father: living: 46 dementia, hypothyroidism Mother: living 85: no major illnesses are known  Social History: Last updated: 02/02/2010 Alcohol Use - no Illicit Drug Use - no Full Time Single  Tobacco Use - No.  Regular Exercise - yes  Risk  Factors: Exercise: yes (02/02/2010)  Risk Factors: Smoking Status: never (02/02/2010)  Family History: Reviewed history from 02/02/2010 and no changes required. Family History of Breast Cancer: Aunt No FH of Colon Cancer: Father: living: 65 dementia, hypothyroidism Mother: living 24: no major illnesses are known  Social History:  Reviewed history from 02/02/2010 and no changes required. Alcohol Use - no Illicit Drug Use - no Full Time Single  Tobacco Use - No.  Regular Exercise - yes   Complete Medication List: 1)  Famvir 250 Mg Tabs (Famciclovir) .... Take 1 by mouth qd 2)  Nasacort Aq 55 Mcg/act Aers (Triamcinolone acetonide(nasal)) .... Use prn 3)  Ferrous Sulfate 325 (65 Fe) Mg Tabs (Ferrous sulfate) .... Take 1 by mouth qd 4)  Fish Oil Concentrate 300 Mg Caps (Omega-3 fatty acids) .... Take 1 by mouth qd 5)  Ultram 50 Mg Tabs (Tramadol hcl) .... Take 1 by mouth once daily prn 6)  Albuterol 90 Mcg/act Aers (Albuterol) .... Use prn 7)  Pth and Ca++ 555.1  8)  Prednisone 5 Mg Tabs (Prednisone) .... Take 1/2 tablet two times a day 9)  Omnaris 50 Mcg/act Susp (Ciclesonide) .... 2 puffs am and 2 puffs in pm 10)  Sudafed 30 Mg Tabs (Pseudoephedrine hcl) .... As directed 11)  Mucinex 600 Mg Xr12h-tab (Guaifenesin) .... As directed 12)  Azithromycin 500 Mg Tabs (Azithromycin) .... Sig 1 by mouth q day x5 days 13)  Rocephin 1 Gm Solr (Ceftriaxone sodium) .... Sig 1gm im x 3 days 14)  Rocephin 1 Gm Solr (Ceftriaxone sodium) .... Sig 1 gram im up to 3 days 15)  Rocephin 1 Gm Solr (Ceftriaxone sodium) .Marland Kitchen.. 1 gram im q day for 3 d ays 16)  Rocephin 1 Gm Solr (Ceftriaxone sodium) .... Sig 1 gram im x 3 days 17)  Im Rocephin Needed  .... Patient has been dx w/pneumonia at [facility] via ct-scan. she was seen on 03/26&3/27 and given a gram of rocephin. she neds a third injection of rocephin for treatment. 18)  Terazol 3 80 Mg Supp (Terconazole) .... Sig 1 per vagina at night x 3 nights 19)   Diflucan 150 Mg Tabs (Fluconazole) .... Once daily and repeat in 1 wek if needed 20)  Terazol 3 80 Mg Supp (Terconazole) .... Sig 1 per vagina at bedtime for 3 nights 21)  Valium 10 Mg Tabs (Diazepam) .... Sig 1/2 to 1 by mouth if trouble sleeping 22)  Omnaris 50 Mcg/act Susp (Ciclesonide) .... As needed 23)  Soma 350 Mg Tabs (Carisoprodol) .... Take one by mouth three times a day 24)  Diflucan 150 Mg Tabs (Fluconazole) .... Once daily 25)  Flector 1.3 % Ptch (Diclofenac epolamine) .... Soig 1/4 to 2/3 a patch qday as needed for muscle and joint pain  Other Orders: Depo-Medrol 20mg  (J1020) Nebulizer Tx (09811) Rocephin 250mg  ([id number]) Albuterol Sulfate Sol 1mg  unit dose (A2130) Ipratropium inhalation sol. unit dose (Q6578) Admin of Therapeutic Inj  intramuscular or subcutaneous (46962)   Patient Instructions: 1)  Please schedule a follow-up here tomorrow and Monday if needed for injection 2)  If shortness of breath gets worse please go to the [facility]  3)  Will consider home duoneb treatment if it helps tommorow   Medication Administration  Injection # 1:    Medication: Depo-Medrol 20mg     Diagnosis: REACTIVE AIRWAY DISEASE (ICD-493.90)    Route: IM    Site: RUOQ gluteus    Exp Date: 4/12    Lot #: [id number]    Mfr: [email or web address]    Patient tolerated injection without complications    Given by: Adella Hare LPN (July 31, 2010 5:50 PM)  Injection # 2:    Medication: Rocephin  250mg     Diagnosis: BRONCHITIS, ACUTE WITH BRONCHOSPASM (ICD-466.0)    Route:  IM    Site: LUOQ gluteus    Exp Date: 03/14/2013    Lot #: [id number]    Mfr: HOSPIRA    Comments: ROCEPHIN 1 GRAM GIVEN    Patient tolerated injection without complications    Given by: Adella Hare LPN (July 31, 2010 5:51 PM)  Medication # 1:    Medication: Albuterol Sulfate Sol 1mg  unit dose    Diagnosis: BRONCHITIS, ACUTE WITH BRONCHOSPASM (ICD-466.0)    Dose: 3.0    Route: inhaled    Exp Date: 04/12    Lot #:  [id number]    Mfr: sandoz    Patient tolerated medication without complications    Given by: Adella Hare LPN (July 31, 2010 5:48 PM)  Medication # 2:    Medication: Ipratropium inhalation sol. unit dose    Diagnosis: BRONCHITIS, ACUTE WITH BRONCHOSPASM (ICD-466.0)    Dose: 0.5    Route: inhaled    Exp Date: 04/12    Lot #: [id number]    Mfr: sandoz    Patient tolerated medication without complications    Given by: Adella Hare LPN (July 31, 2010 5:48 PM)  Orders Added: 1)  Est. Patient Level IV [09811] 2)  Depo-Medrol 20mg  [J1020] 3)  Nebulizer Tx [91478] 4)  Rocephin 250mg  [[id number]] 5)  Albuterol Sulfate Sol 1mg  unit dose [J7613] 6)  Ipratropium inhalation sol. unit dose [J7644] 7)  Admin of Therapeutic Inj  intramuscular or subcutaneous [[id number]]

## 2010-12-15 NOTE — Assessment & Plan Note (Signed)
Summary: SHOT/TJ  CHIEF COMPLAINT: Pt here for Solu medrol 20mg  and Rochephin 1 gram injections per [facility] orders.  VITAL SIGNS:    Height:    (previous:     )    Weight:   (previous:    )    Temp: 96.9    BP: 132/78    Pulse: 87    Resp: 18       02 Sat: 100  ALLERGIES:  Past History, Family History, Social History previously recorded   Vitals Entered By: Areta Haber CMA (February 18, 2010 6:48 PM)  Allergies: 1)  Septra (Sulfamethoxazole-Trimethoprim) 2)  Augmentin (Amoxicillin-Pot Clavulanate) 3)  Biaxin (Clarithromycin) 4)  Ceftin (Cefuroxime Axetil) 5)  Cipro (Ciprofloxacin)  Medication Administration  Injection # 1:    Medication: Solumedrol up to 40mg     Diagnosis: BACTERIAL PNEUMONIA (ICD-482.9)    Route: IM    Site: RUOQ gluteus    Exp Date: 01/11/2011    Lot #: Filbert Berthold    Mfr: Pharmacia    Comments: Administered 20mg     Patient tolerated injection without complications    Given by: Areta Haber CMA (February 18, 2010 7:35 PM)  Injection # 2:    Medication: Rocephin  250mg     Diagnosis: BACTERIAL PNEUMONIA (ICD-482.9)    Route: IM    Site: LUOQ gluteus    Exp Date: 02/12/2012    Lot #: [id number]    Mfr: SandoxA    Comments: Administered 1 gram    Patient tolerated injection without complications    Given by: Areta Haber CMA (February 18, 2010 7:36 PM)  Orders Added: 1)  Solumedrol up to 40mg  [J2920] 2)  Rocephin  250mg  [J0696] 3)  Admin of Therapeutic Inj  intramuscular or subcutaneous [[id number]]   Medication Administration  Injection # 1:    Medication: Solumedrol up to 40mg     Diagnosis: BACTERIAL PNEUMONIA (ICD-482.9)    Route: IM    Site: RUOQ gluteus    Exp Date: 01/11/2011    Lot #: Filbert Berthold    Mfr: Pharmacia    Comments: Administered 20mg     Patient tolerated injection without complications    Given by: Areta Haber CMA (February 18, 2010 7:35 PM)  Injection # 2:    Medication: Rocephin  250mg     Diagnosis: BACTERIAL  PNEUMONIA (ICD-482.9)    Route: IM    Site: LUOQ gluteus    Exp Date: 02/12/2012    Lot #: [id number]    Mfr: SandoxA    Comments: Administered 1 gram    Patient tolerated injection without complications    Given by: Areta Haber CMA (February 18, 2010 7:36 PM)  Orders Added: 1)  Solumedrol up to 40mg  [J2920] 2)  Rocephin  250mg  [J0696] 3)  Admin of Therapeutic Inj  intramuscular or subcutaneous [[id number]]

## 2010-12-15 NOTE — Assessment & Plan Note (Signed)
Summary: ROCEPHIN INJECTION  Nurse Visit   Allergies: 1)  ! Erythromycin 2)  ! Levaquin 3)  Septra 4)  Augmentin (Amoxicillin-Pot Clavulanate) 5)  Biaxin (Clarithromycin) 6)  Cipro (Ciprofloxacin)  Medication Administration  Injection # 1:    Medication: Rocephin 1GM    Diagnosis: BRONCHITIS, ACUTE WITH BRONCHOSPASM (ICD-466.0)    Route: IM    Site: RUOQ gluteus    Exp Date: 10/14/2012    Lot #: [id number]    Mfr: [employer]    Comments: Pt. did not tolerate injection well. Complained of alot of burning. Pt. did not wait for in office. Pt. had a mixture of 2.1cc's of Lidocaine HCL 1% within the Rocephin injection.    Given by: Levonne Spiller EMT-P (August 02, 2010 3:35 PM)  Orders Added: 1)  Est. Patient Level I [04540] 2)  Admin of Therapeutic Inj (IM or [place]) [[id number]]

## 2010-12-15 NOTE — Assessment & Plan Note (Signed)
Summary: FU VISIT/JBB   Vital Signs:  Patient profile:   50 year old female Height:      66 inches Weight:      102 pounds O2 Sat:      100 % on Room air Temp:     96.4 degrees F oral Pulse rate:   76 / minute Pulse rhythm:   regular Resp:     18 per minute BP sitting:   127 / 61  (right arm)  Vitals Entered By: Levonne Spiller EMT-P (June 19, 2010 3:49 PM)  O2 Flow:  Room air Physical Exam General appearance: well developed, well nourished, no acute distress Head: normocephalic, atraumatic Neck: neck supple,  trachea midline, no masses Chest/Lungs: no rales, wheezes, or rhonchi bilateral, breath sounds equal without effort Extremities: normal extremities Skin: no obvious rashes or lesions MSE: oriented to time, place, and person  History of Present Illness History of Present Illness: Patient reports only tolerating 2 of the 3 solumedrol dosages .  Afer the 2nd consecutive dosage she had some palpatations. Despite the palpatations she was able to notice adifference in her breathing by Wednesday. The musckle aches and pain other than her neck have resolved. Breathing and energy has gone from a 4 to a 6. When she got the 3 injections in QApril she said she was up to an 8. The flector patch did seem to work and will allow her to pace 1/4 to 2/3 of a patch on her body over the most tender area.  She request a scrip for diflucan as well.   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.9) UNSPECIFIED DISORDER OF ADRENAL GLANDS (ICD-255.9) POLYARTHRALGIA (ICD-719.49) ANEMIA, IRON DEFICIENCY (ICD-280.9) CROHN'S  DISEASE, 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) OSTEOPOROSIS (ICD-733.00) OSTEOARTHRITIS (ICD-715.90)   Current Meds FAMVIR 250 MG  TABS (FAMCICLOVIR) TAKE 1 by mouth QD NASACORT AQ 55 MCG/ACT  AERS (TRIAMCINOLONE ACETONIDE(NASAL)) USE PRN FERROUS SULFATE 325 (65 FE) MG  TABS (FERROUS SULFATE) TAKE 1 by mouth QD FISH OIL CONCENTRATE 300 MG  CAPS (OMEGA-3 FATTY ACIDS) TAKE 1 by mouth QD ULTRAM 50 MG  TABS (TRAMADOL HCL) TAKE 1 by mouth once daily PRN ALBUTEROL 90 MCG/ACT  AERS (ALBUTEROL) USE PRN * PTH AND CA++  555.1  PREDNISONE 5 MG TABS (PREDNISONE) Take 1/2 tablet two times a day OMNARIS 50 MCG/ACT SUSP (CICLESONIDE) 2 puffs am and 2 puffs in pm SUDAFED 30 MG TABS (PSEUDOEPHEDRINE HCL) as directed MUCINEX 600 MG XR12H-TAB (GUAIFENESIN) as directed AZITHROMYCIN 500 MG TABS (AZITHROMYCIN) sig 1 by mouth q day x5 days ROCEPHIN 1 GM SOLR (CEFTRIAXONE SODIUM) sig 1Gm IM x 3 days ROCEPHIN 1 GM SOLR (CEFTRIAXONE SODIUM) sig 1 gram IM up to 3 days ROCEPHIN 1 GM SOLR (CEFTRIAXONE SODIUM) 1 gram IM q day for 3 d ays ROCEPHIN 1 GM SOLR (CEFTRIAXONE SODIUM) sig 1 gram IM x 3 days * IM ROCEPHIN NEEDED PATIENT HAS BEEN DX W/PNEUMONIA AT [facility] VIA CT-SCAN. SHE WAS SEEN ON 03/26&3/27 AND GIVEN A GRAM OF ROCEPHIN. SHE NEDS A THIRD INJECTION OF ROCEPHIN FOR TREATMENT. 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) 2)  Headache  (ICD-784.0) 3)  Fatigue-malaise  (ICD-780.79) 4)  Myalgia/myositis  (ICD-729.1) 5)  Other Specified Disorders of Adrenal Glands  (ICD-255.8) 6)   Palpitations  (ICD-785.1) 7)  Palpitations, Hx of  (ICD-V12.50) 8)  Colitis, Hx of  (ICD-V12.79) 9)  Constipation, Chronic  (ICD-564.09) 10)  Ibd  (ICD-558.9) 11)  Migraine Headache  (ICD-346.90) 12)  Herpes Zoster  (ICD-053.9) 13)  Rhinosinusitis, Allergic  (ICD-477.9) 14)  Unspecified Disorder of Adrenal Glands  (ICD-255.9) 15)  Polyarthralgia  (ICD-719.49) 16)  Anemia, Iron Deficiency  (ICD-280.9) 17)  Crohn's Disease, Large Intestine  (ICD-555.1) 18)  Osteoporosis  (ICD-733.00) 19)  Osteoarthritis  (ICD-715.90)  Medications Prior to Update: 1)  Famvir 250 Mg  Tabs (Famciclovir) .... Take 1 By Mouth Qd 2)  Nasacort Aq 55 Mcg/act  Aers (Triamcinolone Acetonide(Nasal)) .... Use Prn 3)  Ferrous Sulfate 325 (65 Fe) Mg  Tabs (Ferrous Sulfate) .... Take 1 By Mouth Qd 4)  Fish Oil Concentrate 300 Mg  Caps (Omega-3 Fatty Acids) .... Take 1 By Mouth Qd 5)  Ultram 50 Mg  Tabs (Tramadol Hcl) .... Take 1 By Mouth Once Daily Prn 6)  Albuterol 90 Mcg/act  Aers (Albuterol) .... Use Prn 7)  Pth and Ca++  555.1 8)  Prednisone 5 Mg Tabs (Prednisone) .... Take 1/2 Tablet Two Times A Day 9)  Omnaris 50 Mcg/act Susp (Ciclesonide) .... 2 Puffs Am and 2 Puffs in Pm 10)  Sudafed 30 Mg Tabs (Pseudoephedrine Hcl) .... As Directed 11)  Mucinex 600 Mg Xr12h-Tab (Guaifenesin) .... As Directed 12)  Azithromycin 500 Mg Tabs (Azithromycin) .... Sig 1 By Mouth Q Day X5 Days 13)  Rocephin 1 Gm Solr (Ceftriaxone Sodium) .... Sig 1gm Im X 3 Days 14)  Rocephin 1 Gm Solr (Ceftriaxone Sodium) .... Sig 1 Gram Im Up To 3 Days 15)  Rocephin 1 Gm Solr (Ceftriaxone Sodium) .Marland Kitchen.. 1 Gram Im Q Day For 3 D Ays 16)  Rocephin 1 Gm Solr (Ceftriaxone Sodium) .... Sig 1 Gram Im X 3 Days 17)  Im Rocephin Needed .... Patient Has Been Dx W/pneumonia At [facility] Via Ct-Scan. She Was Seen On 03/26&3/27 and Given A Gram of Rocephin. She Neds A Third Injection of Rocephin For Treatment. 18)  Terazol 3 80 Mg Supp (Terconazole) .... Sig 1 Per  Vagina At Night X 3 Nights 19)  Diflucan 150 Mg Tabs (Fluconazole) .... Once Daily and Repeat in 1 Wek If Needed 20)  Terazol 3 80 Mg Supp (Terconazole) .... Sig 1 Per Vagina At Bedtime For 3 Nights 21)  Valium 10 Mg Tabs (Diazepam) .... Sig 1/2 To 1 By Mouth If Trouble Sleeping 22)  Omnaris 50 Mcg/act Susp (Ciclesonide) .... As Needed 23)  Soma 350 Mg Tabs (Carisoprodol) .... Take One By Mouth Three Times A Day  Allergies: 1)  ! Erythromycin 2)  ! Levaquin 3)  Septra (Sulfamethoxazole-Trimethoprim) 4)  Augmentin (Amoxicillin-Pot Clavulanate) 5)  Biaxin (Clarithromycin) 6)  Cipro (Ciprofloxacin)  Past History:  Past Medical History: Last updated: 02/02/2010 POLYARTHRALGIA (ICD-719.49) ANEMIA, IRON DEFICIENCY (ICD-280.9) CROHN'S DISEASE, 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) OSTEOPOROSIS (ICD-733.00) OSTEOARTHRITIS (ICD-715.90) Allergic rhinitis Asthma Osteopenia Uterine fibroids Genital herpes Hx: iron infusion  Past  Surgical History: Last updated: 02/01/2010 Unremarkable  Family History: Last updated: 02/02/2010 Family History of Breast Cancer: Aunt No FH of Colon Cancer: Father: living: 29 dementia, hypothyroidism Mother: living 64: no major illnesses are known  Social History: Last updated: 02/02/2010 Alcohol Use - no Illicit Drug Use - no Full Time Single  Tobacco Use - No.  Regular Exercise - yes  Risk Factors: Exercise: yes (02/02/2010)  Risk Factors: Smoking Status: never (02/02/2010)  Family History: Reviewed history from 02/02/2010 and no changes required. Family History of Breast Cancer: Aunt No FH of Colon Cancer: Father: living: 63 dementia, hypothyroidism Mother: living 89: no major illnesses are known  Social History: Reviewed history from 02/02/2010 and no changes required. Alcohol Use - no Illicit Drug Use - no Full Time Single  Tobacco Use - No.  Regular Exercise - yes   Impression & Recommendations:  Problem # 1:  adrenal  insuffiecincy Wil give 20mg  of solumedrol today. Will then try on DHEA once a week. When patient took this on a daily basids she had side effects of excessive testtosrerone ie hair loss. Will recomend a reduced doseage of just once a wek and see if it makes a difference.  Follow up w/ PCP or w/ me when I return in September.  Will give new skrip for flector(voltaren )patches  Medications Added to Medication List This Visit: 1)  Diflucan 150 Mg Tabs (Fluconazole) .... Once daily 2)  Flector 1.3 % Ptch (Diclofenac epolamine) .... Soig 1/4 to 2/3 a patch qday as needed for muscle and joint pain  Complete Medication List: 1)  Famvir 250 Mg Tabs (Famciclovir) .... Take 1 by mouth qd 2)  Nasacort Aq 55 Mcg/act Aers (Triamcinolone acetonide(nasal)) .... Use prn 3)  Ferrous Sulfate 325 (65 Fe) Mg Tabs (Ferrous sulfate) .... Take 1 by mouth qd 4)  Fish Oil Concentrate 300 Mg Caps (Omega-3 fatty acids) .... Take 1 by mouth qd 5)  Ultram 50 Mg Tabs (Tramadol hcl) .... Take 1 by mouth once daily prn 6)  Albuterol 90 Mcg/act Aers (Albuterol) .... Use prn 7)  Pth and Ca++ 555.1  8)  Prednisone 5 Mg Tabs (Prednisone) .... Take 1/2 tablet two times a day 9)  Omnaris 50 Mcg/act Susp (Ciclesonide) .... 2 puffs am and 2 puffs in pm 10)  Sudafed 30 Mg Tabs (Pseudoephedrine hcl) .... As directed 11)  Mucinex 600 Mg Xr12h-tab (Guaifenesin) .... As directed 12)  Azithromycin 500 Mg Tabs (Azithromycin) .... Sig 1 by mouth q day x5 days 13)  Rocephin 1 Gm Solr (Ceftriaxone sodium) .... Sig 1gm im x 3 days 14)  Rocephin 1 Gm Solr (Ceftriaxone sodium) .... Sig 1 gram im up to 3 days 15)  Rocephin 1 Gm Solr (Ceftriaxone sodium) .Marland Kitchen.. 1 gram im q day for 3 d ays 16)  Rocephin 1 Gm Solr (Ceftriaxone sodium) .... Sig 1 gram im x 3 days 17)  Im Rocephin Needed  .... Patient has been dx w/pneumonia at [facility] via ct-scan. she was seen on 03/26&3/27 and given a gram of rocephin. she neds a third injection of rocephin for  treatment. 18)  Terazol 3 80 Mg Supp (Terconazole) .... Sig 1 per vagina at night x 3 nights 19)  Diflucan 150 Mg Tabs (Fluconazole) .... Once daily and repeat in 1 wek if needed 20)  Terazol 3 80 Mg Supp (Terconazole) .... Sig 1 per vagina at bedtime for 3 nights 21)  Valium 10 Mg Tabs (Diazepam) .... Sig 1/2 to  1 by mouth if trouble sleeping 22)  Omnaris 50 Mcg/act Susp (Ciclesonide) .... As needed 23)  Soma 350 Mg Tabs (Carisoprodol) .... Take one by mouth three times a day  Other Orders: Solumedrol up to 40mg  (Z6109)  Patient Instructions: 1)  Please schedule a follow-up appointment as needed. 2)  Please schedule an appointment with your primary doctor in the future. User voltaren patch as described.  3)  Try using DHEA once a wek and see if it helps.  4)  Per patient request willl give scrip for diflucan w/ a refill Prescriptions: FLECTOR 1.3 % PTCH (DICLOFENAC EPOLAMINE) soig 1/4 to 2/3 a patch qday as needed for muscle and joint pain  #10-30 x 6   Entered and Authorized by:   Hassan Rowan MD   Signed by:   Hassan Rowan MD on 06/19/2010   Method used:   Printed then faxed to ...       [facility] (retail)       [address]       [place], [place]  [address]       Ph: [phone]       Fax: [phone]   RxID:   [id number] DIFLUCAN 150 MG TABS (FLUCONAZOLE) once daily  #1 x 2   Entered and Authorized by:   Hassan Rowan MD   Signed by:   Hassan Rowan MD on 06/19/2010   Method used:   Printed then faxed to ...       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

## 2010-12-15 NOTE — Assessment & Plan Note (Signed)
Summary: SHOT/TJ  CHIEF COMPLAINT: pt here for injestion per Dr. Warden Fillers orders.  VITAL SIGNS:    Height:    (previous:     )    Weight:   (previous:    )    Temp: 97.2    BP: 130/78    Pulse: 79    Resp: 18    02 Sat: 100%  ALLERGIES:  Past History, Family History, Social History previously recorded    Vitals Entered By: Areta Haber CMA (February 19, 2010 6:43 PM)  Allergies: 1)  ! Erythromycin 2)  ! Levaquin 3)  Septra (Sulfamethoxazole-Trimethoprim) 4)  Augmentin (Amoxicillin-Pot Clavulanate) 5)  Biaxin (Clarithromycin) 6)  Cipro (Ciprofloxacin)  Medication Administration  Injection # 1:    Medication: Solumedrol up to 40mg     Diagnosis: BACTERIAL PNEUMONIA (ICD-482.9)    Route: IM    Site: LUOQ gluteus    Exp Date: 01/11/2011    Lot #: Filbert Berthold    Mfr: Pharmacia    Comments: Administered 20mg     Patient tolerated injection without complications    Given by: Areta Haber CMA (February 19, 2010 7:01 PM)  Injection # 2:    Medication: Rocephin  250mg     Diagnosis: BACTERIAL PNEUMONIA (ICD-482.9)    Route: IM    Site: RUOQ gluteus    Exp Date: 10/13/2012    Lot #: [id number]    Mfr: Sandox    Comments: Administered 1 gram    Patient tolerated injection without complications    Given by: Areta Haber CMA (February 19, 2010 7:02 PM)  Orders Added: 1)  Rocephin  250mg  [J0696] 2)  Admin of Therapeutic Inj  intramuscular or subcutaneous [96372] 3)  Solumedrol up to 40mg  [J2920]   Medication Administration  Injection # 1:    Medication: Solumedrol up to 40mg     Diagnosis: BACTERIAL PNEUMONIA (ICD-482.9)    Route: IM    Site: LUOQ gluteus    Exp Date: 01/11/2011    Lot #: Filbert Berthold    Mfr: Pharmacia    Comments: Administered 20mg     Patient tolerated injection without complications    Given by: Areta Haber CMA (February 19, 2010 7:01 PM)  Injection # 2:    Medication: Rocephin  250mg     Diagnosis: BACTERIAL PNEUMONIA (ICD-482.9)    Route: IM  Site: RUOQ gluteus    Exp Date: 10/13/2012    Lot #: [id number]    Mfr: Sandox    Comments: Administered 1 gram    Patient tolerated injection without complications    Given by: Areta Haber CMA (February 19, 2010 7:02 PM)  Orders Added: 1)  Rocephin  250mg  [J0696] 2)  Admin of Therapeutic Inj  intramuscular or subcutaneous [96372] 3)  Solumedrol up to 40mg  [J2920]

## 2010-12-15 NOTE — Letter (Signed)
Summary: External Correspondence  External Correspondence   Imported By: Dannette Barbara 02/18/2010 13:44:41  _____________________________________________________________________  External Attachment:    Type:   Image     Comment:   External Document

## 2010-12-15 NOTE — Assessment & Plan Note (Signed)
Summary: sinus pressuer, cough, chest congestion x 2 mth rm 1   Vital Signs:  Patient Profile:   49 Years Old Female CC:      Cold & URI symptoms Height:     66 inches Weight:      100 pounds O2 Sat:      100 % O2 treatment:    Room Air Temp:     97.5 degrees F oral Pulse rate:   61 / minute Pulse rhythm:   regular Resp:     16 per minute BP sitting:   117 / 67  (right arm) Cuff size:   regular  Vitals Entered By: Areta Haber, CMA                  Prior Medication List:  FAMVIR 250 MG  TABS (FAMCICLOVIR) TAKE 1 by mouth QD SOMA 350 MG  TABS (CARISOPRODOL) TAKE 1/2 TAB QD NASACORT AQ 55 MCG/ACT  AERS (TRIAMCINOLONE ACETONIDE(NASAL)) USE PRN FERROUS SULFATE 325 (65 FE) MG  TABS (FERROUS SULFATE) TAKE 1 by mouth QD FISH OIL CONCENTRATE 300 MG  CAPS (OMEGA-3 FATTY ACIDS) TAKE 1 by mouth QD FEVERFEW 100 MG  CAPS (FEVERFEW) TAKE 1 by mouth QD HAWTHORN BERRY 500 MG  CAPS (HAWTHORN) TAKE 1 by mouth QD ULTRAM 50 MG  TABS (TRAMADOL HCL) TAKE 1 by mouth once daily PRN ALBUTEROL 90 MCG/ACT  AERS (ALBUTEROL) USE PRN * PTH AND CA++  555.1  PREDNISONE 1 MG  TABS (PREDNISONE) 4 tabs qd   Current Allergies: SEPTRA (SULFAMETHOXAZOLE-TRIMETHOPRIM) AUGMENTIN (AMOXICILLIN-POT CLAVULANATE) BIAXIN (CLARITHROMYCIN) CEFTIN (CEFUROXIME AXETIL) CIPRO (CIPROFLOXACIN)    History of Present Illness Chief Complaint: Cold & URI symptoms History of Present Illness: Patient has had several visit w/ general fatigue R sided chest pain and congestiopn. She has been sick over She has been on a course of levaquin which did not do well. She has been frustrated w/ the different doctors and was sent back to work on Wednesday. On Moonday after persistently requesting a CT scan one was done that showed R lower lobe pneumonia. Patient got her results Friday and has brought me a copy of her Chest CT sca showing pneumonia. Obviously she feels disapointed w/ the ot5her doctors that she has been seeing.  Current Problems: BACTERIAL PNEUMONIA (ICD-482.9) COLITIS, HX OF (ICD-V12.79) CONSTIPATION, CHRONIC (ICD-564.09) IBD (ICD-558.9) MIGRAINE HEADACHE (ICD-346.90) HERPES ZOSTER (ICD-053.9) RHINOSINUSITIS, ALLERGIC (ICD-477.9) UNSPECIFIED DISORDER OF ADRENAL GLANDS (ICD-255.9) POLYARTHRALGIA (ICD-719.49) ANEMIA, IRON DEFICIENCY (ICD-280.9) CROHN'S DISEASE, 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) OSTEOPOROSIS (ICD-733.00) OSTEOARTHRITIS (ICD-715.90)   Current Meds FAMVIR 250 MG  TABS (FAMCICLOVIR) TAKE 1 by mouth QD NASACORT AQ 55 MCG/ACT  AERS (TRIAMCINOLONE ACETONIDE(NASAL)) USE PRN FERROUS SULFATE 325 (65 FE) MG  TABS (FERROUS SULFATE) TAKE 1 by mouth QD FISH OIL CONCENTRATE 300 MG  CAPS (OMEGA-3 FATTY ACIDS) TAKE 1 by mouth QD FEVERFEW 100 MG  CAPS (FEVERFEW) TAKE 1 by mouth QD HAWTHORN BERRY 500 MG  CAPS (HAWTHORN) TAKE 1 by mouth QD ULTRAM 50 MG  TABS (TRAMADOL HCL) TAKE 1 by mouth once daily PRN ALBUTEROL 90 MCG/ACT  AERS (ALBUTEROL) USE PRN * PTH AND CA++  555.1  PREDNISONE 1 MG  TABS (PREDNISONE) 4 tabs qd OMNARIS 50 MCG/ACT SUSP (CICLESONIDE) 2 puffs am and 2 puffs in pm SUDAFED 30 MG TABS (PSEUDOEPHEDRINE HCL) as directed MUCINEX 600 MG XR12H-TAB (GUAIFENESIN) as directed AZITHROMYCIN 500 MG TABS (AZITHROMYCIN) sig 1 by mouth q day x5 days ROCEPHIN 1 GM SOLR (CEFTRIAXONE SODIUM) sig 1Gm IM x  3 days ROCEPHIN 1 GM SOLR (CEFTRIAXONE SODIUM) sig 1 gram IM up to 3 days ROCEPHIN 1 GM SOLR (CEFTRIAXONE SODIUM) 1 gram IM q day for 3 d ays ROCEPHIN 1 GM SOLR (CEFTRIAXONE SODIUM) sig 1 gram IM x 3 days  REVIEW OF SYSTEMS Constitutional Symptoms      Denies fever, chills, night sweats, weight loss, weight gain, and fatigue.  Eyes       Denies change in vision, eye pain, eye discharge, glasses, contact lenses, and eye surgery. Ear/Nose/Throat/Mouth       Complains of ear pain and sinus problems.      Denies hearing loss/aids, change in hearing, dizziness, frequent runny nose, frequent nose  bleeds, sore throat, hoarseness, and tooth pain or bleeding.      Comments: L x  Respiratory       Complains of dry cough and shortness of breath.      Denies productive cough, wheezing, asthma, bronchitis, and emphysema/COPD.      Comments: chest congestion Cardiovascular       Denies murmurs, chest pain, and tires easily with exhertion.    Gastrointestinal       Denies stomach pain, nausea/vomiting, diarrhea, constipation, blood in bowel movements, and indigestion. Genitourniary       Denies painful urination, kidney stones, and loss of urinary control. Neurological       Complains of headaches.      Denies paralysis, seizures, and fainting/blackouts. Musculoskeletal       Denies muscle pain, joint pain, joint stiffness, decreased range of motion, redness, swelling, muscle weakness, and gout.  Skin       Denies bruising, unusual mles/lumps or sores, and hair/skin or nail changes.  Psych       Denies mood changes, temper/anger issues, anxiety/stress, speech problems, depression, and sleep problems. Other Comments: Pt states she has seen other physicians but is not getting any better.    Past History:  Past Medical History: Last updated: 02/02/2010 POLYARTHRALGIA (ICD-719.49) ANEMIA, IRON DEFICIENCY (ICD-280.9) CROHN'S DISEASE, 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) OSTEOPOROSIS (ICD-733.00) OSTEOARTHRITIS (ICD-715.90) Allergic rhinitis Asthma Osteopenia Uterine fibroids Genital herpes Hx: iron infusion  Past Surgical History: Last updated: 02/01/2010 Unremarkable  Family History: Last updated: 02/02/2010 Family History of Breast Cancer: Aunt No FH of Colon Cancer: Father: living: 35 dementia, hypothyroidism Mother: living 15: no major illnesses are known  Social History: Last updated: 02/02/2010 Alcohol Use - no Illicit Drug Use - no Full Time Single  Tobacco Use - No.  Regular Exercise - yes  Risk Factors: Exercise: yes (02/02/2010)  Risk Factors: Smoking  Status: never (02/02/2010)  Family History: Reviewed history from 02/02/2010 and no changes required. Family History of Breast Cancer: Aunt No FH of Colon Cancer: Father: living: 45 dementia, hypothyroidism Mother: living 59: no major illnesses are known  Social History: Reviewed history from 02/02/2010 and no changes required. Alcohol Use - no Illicit Drug Use - no Full Time Single  Tobacco Use - No.  Regular Exercise - yes Physical Exam General appearance: emaciated ,thin W female Head: normocephalic, atraumatic Neck: neck supple,  trachea midline, no masses Chest/Lungs: no rales, wheezes, or rhonchi bilateral, breath sounds equal without effort Heart: regular rate and  rhythm, no murmur Skin: no obvious rashes or lesions MSE: oriented to time, place, and person Assessment New Problems: BACTERIAL PNEUMONIA (ICD-482.9)  RlL pneumonia  Patient Education: Patient and/or caregiver instructed in the following: rest fluids and Tylenol.  Plan New Medications/Changes: ROCEPHIN 1 GM SOLR (CEFTRIAXONE SODIUM) sig  1 gram IM x 3 days  #1 x 0, 02/06/2010, Hassan Rowan MD ROCEPHIN 1 GM SOLR (CEFTRIAXONE SODIUM) 1 gram IM q day for 3 d ays  #1 x 0, 02/06/2010, Hassan Rowan MD ROCEPHIN 1 GM SOLR (CEFTRIAXONE SODIUM) sig 1 gram IM up to 3 days  #1 x 0, 02/06/2010, Hassan Rowan MD ROCEPHIN 1 GM SOLR (CEFTRIAXONE SODIUM) sig 1Gm IM x 3 days  #3 x 0, 02/06/2010, Hassan Rowan MD AZITHROMYCIN 500 MG TABS (AZITHROMYCIN) sig 1 by mouth q day x5 days  #5 x 0, 02/06/2010, Hassan Rowan MD  New Orders: New Patient Level IV [phone] Admin of patients own med IM/SQ [phone] Planning Comments:   to get 1 gram IM of rocephin q day x3  Follow Up: Follow up in 1-2 days if no improvement  The patient and/or caregiver has been counseled thoroughly with regard to medications prescribed including dosage, schedule, interactions, rationale for use, and possible side effects and they verbalize understanding.   Diagnoses and expected course of recovery discussed and will return if not improved as expected or if the condition worsens. Patient and/or caregiver verbalized understanding.  Prescriptions: ROCEPHIN 1 GM SOLR (CEFTRIAXONE SODIUM) sig 1 gram IM x 3 days  #1 x 0   Entered and Authorized by:   Hassan Rowan MD   Signed by:   Hassan Rowan MD on 02/06/2010   Method used:   Print then Give to Patient   RxID:   [id number] ROCEPHIN 1 GM SOLR (CEFTRIAXONE SODIUM) 1 gram IM q day for 3 d ays  #1 x 0   Entered and Authorized by:   Hassan Rowan MD   Signed by:   Hassan Rowan MD on 02/06/2010   Method used:   Print then Give to Patient   RxID:   [id number] ROCEPHIN 1 GM SOLR (CEFTRIAXONE SODIUM) sig 1 gram IM up to 3 days  #1 x 0   Entered and Authorized by:   Hassan Rowan MD   Signed by:   Hassan Rowan MD on 02/06/2010   Method used:   Print then Give to Patient   RxID:   [id number] ROCEPHIN 1 GM SOLR (CEFTRIAXONE SODIUM) sig 1Gm IM x 3 days  #3 x 0   Entered and Authorized by:   Hassan Rowan MD   Signed by:   Hassan Rowan MD on 02/06/2010   Method used:   Print then Give to Patient   RxID:   [id number] AZITHROMYCIN 500 MG TABS (AZITHROMYCIN) sig 1 by mouth q day x5 days  #5 x 0   Entered and Authorized by:   Hassan Rowan MD   Signed by:   Hassan Rowan MD on 02/06/2010   Method used:   Print then Give to Patient   RxID:   [id number]   Patient Instructions: 1)  Pneumonia not improving with oral medication.  2)  Will give rochepin 1 gram IM q day for 3 days minimum  3)  also will g ive zithromax 500mg  by mouth q day x 5 days. 4)  Please schedule a follow-up appointment as needed. 5)  Please schedule an appointment with your primary doctor in :3-5days   Pt brought in her own antibiotic of Rocephin 1 gm Lot# [id number] EXP:06/12/2012 IM 1.70mL R GLUT and 1mL L GLUT administered by Dr. Thurmond Butts. Pt tolerated injection well.

## 2010-12-15 NOTE — Assessment & Plan Note (Signed)
Summary: still sick/jbb   Vital Signs:  Patient profile:   50 year old female Height:      66 inches Weight:      101 pounds BMI:     16.36 O2 Sat:      100 % on Room air Pulse rate:   75 / minute Resp:     16 per minute BP sitting:   108 / 73  (left arm)  Vitals Entered By: Adella Hare LPN (August 01, 2010 2:58 PM)  O2 Flow:  Room air CC: cough  Physical Exam General appearance: well developed, thin WF  Head: normocephalic, atraumatic Neck: neck supple,  trachea midline, no masses Chest/Lungs: no rales, wheezes, or rhonchi bilateral, breath sounds equal without effort Heart: regular rate and  rhythm, no murmur Skin: no obvious rashes or lesions MSE: oriented to time, place, and person  History of Present Illness Chief Complaint: cough History of Present Illness: Patient is a 50 year old WF who returns for follow up. While the duoneb helped her breathing it made her dizzy. the dizzynesss lasted most of the night she is breathing easier at this time.   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) OTHER SPECIFIED DISORDERS OF ADRENAL GLANDS (ICD-255.8) PALPITATIONS (ICD-785.1) PALPITATIONS, HX OF (ICD-V12.50) COLITIS, HX OF (ICD-V12.79) CONSTIPATION, CHRONIC (ICD-564.09) IBD (ICD-558.9) MIGRAINE HEADACHE (ICD-346.90) HERPES ZOSTER (ICD-053.9) RHINOSINUSITIS, ALLERGIC (ICD-477.9) UNSPECIFIED DISORDER OF ADRENAL GLANDS (ICD-255.9) POLYARTHRALGIA (ICD-719.49) ANEMIA, IRON DEFICIENCY (ICD-280.9) CROHN'S DISEASE, 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) OSTEOPOROSIS (ICD-733.00) OSTEOARTHRITIS (ICD-715.90)   Current Meds FAMVIR 250 MG  TABS (FAMCICLOVIR) TAKE 1 by mouth QD NASACORT AQ 55 MCG/ACT  AERS (TRIAMCINOLONE ACETONIDE(NASAL)) USE PRN FERROUS SULFATE 325 (65 FE) MG  TABS (FERROUS SULFATE) TAKE 1 by mouth QD FISH OIL CONCENTRATE 300 MG  CAPS (OMEGA-3 FATTY ACIDS) TAKE 1 by  mouth QD ULTRAM 50 MG  TABS (TRAMADOL HCL) TAKE 1 by mouth once daily PRN ALBUTEROL 90 MCG/ACT  AERS (ALBUTEROL) USE PRN * PTH AND CA++  555.1  PREDNISONE 5 MG TABS (PREDNISONE) Take 1/2 tablet two times a day OMNARIS 50 MCG/ACT SUSP (CICLESONIDE) 2 puffs am and 2 puffs in pm SUDAFED 30 MG TABS (PSEUDOEPHEDRINE HCL) as directed MUCINEX 600 MG XR12H-TAB (GUAIFENESIN) as directed AZITHROMYCIN 500 MG TABS (AZITHROMYCIN) sig 1 by mouth q day x5 days ROCEPHIN 1 GM SOLR (CEFTRIAXONE SODIUM) sig 1Gm IM x 3 days ROCEPHIN 1 GM SOLR (CEFTRIAXONE SODIUM) sig 1 gram IM up to 3 days ROCEPHIN 1 GM SOLR (CEFTRIAXONE SODIUM) 1 gram IM q day for 3 d ays ROCEPHIN 1 GM SOLR (CEFTRIAXONE SODIUM) sig 1 gram IM x 3 days * IM ROCEPHIN NEEDED PATIENT HAS BEEN DX W/PNEUMONIA AT [facility] VIA CT-SCAN. SHE WAS SEEN ON 03/26&3/27 AND GIVEN A GRAM OF ROCEPHIN. SHE NEDS A THIRD INJECTION OF ROCEPHIN FOR TREATMENT. TERAZOL 3 80 MG SUPP (TERCONAZOLE) SIG 1 PER VAGINA AT NIGHT X 3 NIGHTS DIFLUCAN 150 MG TABS (FLUCONAZOLE) once daily and repeat in 1 wek if needed TERAZOL 3 80 MG SUPP (TERCONAZOLE) sig 1 per vagina at bedtime for 3 nights VALIUM 10 MG TABS (DIAZEPAM) sig 1/2 to 1 by mouth if trouble sleeping OMNARIS 50 MCG/ACT SUSP (CICLESONIDE) as needed SOMA 350 MG TABS (CARISOPRODOL) take one by mouth three times a day DIFLUCAN 150 MG TABS (FLUCONAZOLE) once daily FLECTOR 1.3 % PTCH (DICLOFENAC EPOLAMINE) soig 1/4 to 2/3 a patch qday as needed for muscle and joint pain COMBIVENT 103-18 MCG/ACT AERO (  ALBUTEROL-IPRATROPIUM) 2 inh. 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) 4)  Other Specified Disorders of Adrenal Glands  (ICD-255.8) 5)  Palpitations  (ICD-785.1) 6)  Palpitations, Hx of  (ICD-V12.50) 7)  Colitis, Hx of  (ICD-V12.79) 8)  Constipation, Chronic  (ICD-564.09) 9)  Ibd   (ICD-558.9) 10)  Migraine Headache  (ICD-346.90) 11)  Herpes Zoster  (ICD-053.9) 12)  Rhinosinusitis, Allergic  (ICD-477.9) 13)  Unspecified Disorder of Adrenal Glands  (ICD-255.9) 14)  Polyarthralgia  (ICD-719.49) 15)  Anemia, Iron Deficiency  (ICD-280.9) 16)  Crohn's Disease, Large Intestine  (ICD-555.1) 17)  Osteoporosis  (ICD-733.00) 18)  Osteoarthritis  (ICD-715.90)  Medications Prior to Update: 1)  Famvir 250 Mg  Tabs (Famciclovir) .... Take 1 By Mouth Qd 2)  Nasacort Aq 55 Mcg/act  Aers (Triamcinolone Acetonide(Nasal)) .... Use Prn 3)  Ferrous Sulfate 325 (65 Fe) Mg  Tabs (Ferrous Sulfate) .... Take 1 By Mouth Qd 4)  Fish Oil Concentrate 300 Mg  Caps (Omega-3 Fatty Acids) .... Take 1 By Mouth Qd 5)  Ultram 50 Mg  Tabs (Tramadol Hcl) .... Take 1 By Mouth Once Daily Prn 6)  Albuterol 90 Mcg/act  Aers (Albuterol) .... Use Prn 7)  Pth and Ca++  555.1 8)  Prednisone 5 Mg Tabs (Prednisone) .... Take 1/2 Tablet Two Times A Day 9)  Omnaris 50 Mcg/act Susp (Ciclesonide) .... 2 Puffs Am and 2 Puffs in Pm 10)  Sudafed 30 Mg Tabs (Pseudoephedrine Hcl) .... As Directed 11)  Mucinex 600 Mg Xr12h-Tab (Guaifenesin) .... As Directed 12)  Azithromycin 500 Mg Tabs (Azithromycin) .... Sig 1 By Mouth Q Day X5 Days 13)  Rocephin 1 Gm Solr (Ceftriaxone Sodium) .... Sig 1gm Im X 3 Days 14)  Rocephin 1 Gm Solr (Ceftriaxone Sodium) .... Sig 1 Gram Im Up To 3 Days 15)  Rocephin 1 Gm Solr (Ceftriaxone Sodium) .Marland Kitchen.. 1 Gram Im Q Day For 3 D Ays 16)  Rocephin 1 Gm Solr (Ceftriaxone Sodium) .... Sig 1 Gram Im X 3 Days 17)  Im Rocephin Needed .... Patient Has Been Dx W/pneumonia At [facility] Via Ct-Scan. She Was Seen On 03/26&3/27 and Given A Gram of Rocephin. She Neds A Third Injection of Rocephin For Treatment. 18)  Terazol 3 80 Mg Supp (Terconazole) .... Sig 1 Per Vagina At Night X 3 Nights 19)  Diflucan 150 Mg Tabs (Fluconazole) .... Once Daily and Repeat in 1 Wek If Needed 20)  Terazol 3 80 Mg Supp (Terconazole)  .... Sig 1 Per Vagina At Bedtime For 3 Nights 21)  Valium 10 Mg Tabs (Diazepam) .... Sig 1/2 To 1 By Mouth If Trouble Sleeping 22)  Omnaris 50 Mcg/act Susp (Ciclesonide) .... As Needed 23)  Soma 350 Mg Tabs (Carisoprodol) .... Take One By Mouth Three Times A Day 24)  Diflucan 150 Mg Tabs (Fluconazole) .... Once Daily 25)  Flector 1.3 % Ptch (Diclofenac Epolamine) .... Soig 1/4 To 2/3 A Patch Qday As Needed For Muscle and Joint Pain  Allergies: 1)  ! Erythromycin 2)  ! Levaquin 3)  Septra 4)  Augmentin (Amoxicillin-Pot Clavulanate) 5)  Biaxin (Clarithromycin) 6)  Cipro (Ciprofloxacin)   Past History:  Past Medical History: Last updated: 02/02/2010 POLYARTHRALGIA (ICD-719.49) ANEMIA, IRON DEFICIENCY (ICD-280.9) CROHN'S DISEASE, 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) OSTEOPOROSIS (ICD-733.00) OSTEOARTHRITIS (ICD-715.90) Allergic rhinitis Asthma Osteopenia Uterine fibroids Genital herpes Hx: iron infusion  Past  Surgical History: Last updated: 02/01/2010 Unremarkable  Family History: Last updated: 02/02/2010 Family History of Breast Cancer: Aunt No FH of Colon Cancer: Father: living: 36 dementia, hypothyroidism Mother: living 68: no major illnesses are known  Social History: Last updated: 02/02/2010 Alcohol Use - no Illicit Drug Use - no Full Time Single  Tobacco Use - No.  Regular Exercise - yes  Risk Factors: Exercise: yes (02/02/2010)  Risk Factors: Smoking Status: never (02/02/2010)  Family History: Reviewed history from 02/02/2010 and no changes required. Family History of Breast Cancer: Aunt No FH of Colon Cancer: Father: living: 84 dementia, hypothyroidism Mother: living 31: no major illnesses are known  Social History: Reviewed history from 02/02/2010 and no changes required. Alcohol Use - no Illicit Drug Use - no Full Time Single  Tobacco Use - No.  Regular Exercise - yes   Impression & Recommendations:  Problem # 1:  recurrent reactive airway  DX Assessment Improved (1)Will try to get her work place to [employer] under the vents that produces cold air which can trigger her asthma attacks.  (2)try lower dose combivent inhaler ie 2 puffs twice a day instad of 4 puffs.  (3) restart singulair   normal she takes 2.5mg  try to increase to 3mg  and then  4 mg (4) continue w/dexamethasone 20-25 mg injection today and tomorrow (5) rocephin injection 1 gram today and tomorrow  Complete Medication List: 1)  Famvir 250 Mg Tabs (Famciclovir) .... Take 1 by mouth qd 2)  Nasacort Aq 55 Mcg/act Aers (Triamcinolone acetonide(nasal)) .... Use prn 3)  Ferrous Sulfate 325 (65 Fe) Mg Tabs (Ferrous sulfate) .... Take 1 by mouth qd 4)  Fish Oil Concentrate 300 Mg Caps (Omega-3 fatty acids) .... Take 1 by mouth qd 5)  Ultram 50 Mg Tabs (Tramadol hcl) .... Take 1 by mouth once daily prn 6)  Albuterol 90 Mcg/act Aers (Albuterol) .... Use prn 7)  Pth and Ca++ 555.1  8)  Prednisone 5 Mg Tabs (Prednisone) .... Take 1/2 tablet two times a day 9)  Omnaris 50 Mcg/act Susp (Ciclesonide) .... 2 puffs am and 2 puffs in pm 10)  Sudafed 30 Mg Tabs (Pseudoephedrine hcl) .... As directed 11)  Mucinex 600 Mg Xr12h-tab (Guaifenesin) .... As directed 12)  Azithromycin 500 Mg Tabs (Azithromycin) .... Sig 1 by mouth q day x5 days 13)  Rocephin 1 Gm Solr (Ceftriaxone sodium) .... Sig 1gm im x 3 days 14)  Rocephin 1 Gm Solr (Ceftriaxone sodium) .... Sig 1 gram im up to 3 days 15)  Rocephin 1 Gm Solr (Ceftriaxone sodium) .Marland Kitchen.. 1 gram im q day for 3 d ays 16)  Rocephin 1 Gm Solr (Ceftriaxone sodium) .... Sig 1 gram im x 3 days 17)  Im Rocephin Needed  .... Patient has been dx w/pneumonia at [facility] via ct-scan. she was seen on 03/26&3/27 and given a gram of rocephin. she neds a third injection of rocephin for treatment. 18)  Terazol 3 80 Mg Supp (Terconazole) .... Sig 1 per vagina at night x 3 nights 19)  Diflucan 150 Mg Tabs (Fluconazole) .... Once daily and repeat in 1 wek if  needed 20)  Terazol 3 80 Mg Supp (Terconazole) .... Sig 1 per vagina at bedtime for 3 nights 21)  Valium 10 Mg Tabs (Diazepam) .... Sig 1/2 to 1 by mouth if trouble sleeping 22)  Omnaris 50 Mcg/act Susp (Ciclesonide) .... As needed 23)  Soma 350 Mg Tabs (Carisoprodol) .... Take one by mouth three times a day  24)  Diflucan 150 Mg Tabs (Fluconazole) .... Once daily 25)  Flector 1.3 % Ptch (Diclofenac epolamine) .... Soig 1/4 to 2/3 a patch qday as needed for muscle and joint pain 26)  Combivent 103-18 Mcg/act Aero (Albuterol-ipratropium) .... 2 inh. q4-6h as needed  Other Orders: Rocephin  250mg  (J8119) Depo-Medrol 20mg  (J1020) Admin of Therapeutic Inj  intramuscular or subcutaneous (14782)  Patient Instructions: 1)  follow up w/endocrinologist as schedule 2)  restart singulair at 2.5mg  and try to increase dosage to 4mg  3)  obtain dexamethasone injection here today and tomorrow 20-25mg  IM  4)  rocephin 1 gram IM today and tomorrow 5)  NOTE FOR WORK ASKING THAT IN THE FUTURE THEY AVOID EXPOSING YOU TO NOXIOUS FUMES AND COLD BLOWING VENTS Prescriptions: COMBIVENT 103-18 MCG/ACT AERO (ALBUTEROL-IPRATROPIUM) 2 inh. q4-6h as needed  #1 x 6   Entered and Authorized by:   Hassan Rowan MD   Signed by:   Hassan Rowan MD on 08/01/2010   Method used:   Print then Give to Patient   RxID:   [id number]    Medication Administration  Injection # 1:    Medication: Rocephin  250mg     Diagnosis: BRONCHITIS, ACUTE WITH BRONCHOSPASM (ICD-466.0)    Route: IM    Site: RUOQ gluteus    Exp Date: 10/14/2012    Lot #: [id number]    Mfr: hospira    Comments: rocephin 1 gram given    Patient tolerated injection without complications    Given by: Adella Hare LPN (August 01, 2010 3:00 PM)  Injection # 2:    Medication: Depo-Medrol 20mg     Diagnosis: BRONCHITIS, ACUTE WITH BRONCHOSPASM (ICD-466.0)    Route: IM    Site: LUOQ gluteus    Exp Date: 4/12    Lot #: [id number]    Mfr: Pharmacia    Patient  tolerated injection without complications    Given by: Adella Hare LPN (August 01, 2010 3:01 PM)  Orders Added: 1)  Est. Patient Level III [13244] 2)  Rocephin  250mg  [J0696] 3)  Depo-Medrol 20mg  [J1020] 4)  Admin of Therapeutic Inj  intramuscular or subcutaneous [01027]

## 2010-12-15 NOTE — Assessment & Plan Note (Signed)
Summary: SOB, R side pain, cough-dry x 6 dys rm 1   Vital Signs:  Patient Profile:   50 Years Old Female CC:      Cold & URI symptoms Height:     66 inches Weight:      100 pounds O2 Sat:      99 % O2 treatment:    Room Air Temp:     97.8 degrees F oral Pulse rate:   83 / minute Pulse rhythm:   regular Resp:     18 per minute BP sitting:   130 / 72  (right arm) Cuff size:   regular  Vitals Entered By: Areta Haber CMA (February 17, 2010 7:12 PM)                  Prior Medication List:  FAMVIR 250 MG  TABS (FAMCICLOVIR) TAKE 1 by mouth QD NASACORT AQ 55 MCG/ACT  AERS (TRIAMCINOLONE ACETONIDE(NASAL)) USE PRN FERROUS SULFATE 325 (65 FE) MG  TABS (FERROUS SULFATE) TAKE 1 by mouth QD FISH OIL CONCENTRATE 300 MG  CAPS (OMEGA-3 FATTY ACIDS) TAKE 1 by mouth QD FEVERFEW 100 MG  CAPS (FEVERFEW) TAKE 1 by mouth QD HAWTHORN BERRY 500 MG  CAPS (HAWTHORN) TAKE 1 by mouth QD ULTRAM 50 MG  TABS (TRAMADOL HCL) TAKE 1 by mouth once daily PRN ALBUTEROL 90 MCG/ACT  AERS (ALBUTEROL) USE PRN * PTH AND CA++  555.1  PREDNISONE 1 MG  TABS (PREDNISONE) 4 tabs qd OMNARIS 50 MCG/ACT SUSP (CICLESONIDE) 2 puffs am and 2 puffs in pm SUDAFED 30 MG TABS (PSEUDOEPHEDRINE HCL) as directed MUCINEX 600 MG XR12H-TAB (GUAIFENESIN) as directed AZITHROMYCIN 500 MG TABS (AZITHROMYCIN) sig 1 by mouth q day x5 days ROCEPHIN 1 GM SOLR (CEFTRIAXONE SODIUM) sig 1Gm IM x 3 days ROCEPHIN 1 GM SOLR (CEFTRIAXONE SODIUM) sig 1 gram IM up to 3 days ROCEPHIN 1 GM SOLR (CEFTRIAXONE SODIUM) 1 gram IM q day for 3 d ays ROCEPHIN 1 GM SOLR (CEFTRIAXONE SODIUM) sig 1 gram IM x 3 days * IM ROCEPHIN NEEDED PATIENT HAS BEEN DX W/[facility]UMONIA AT [facility] VIA CT-SCAN. SHE WAS SEEN ON 03/26&3/27 AND GIVEN A GRAM OF ROCEPHIN. SHE NEDS A THIRD INJECTION OF ROCEPHIN FOR TREATMENT. TERAZOL 3 80 MG SUPP (TERCONAZOLE) SIG 1 PER VAGINA AT NIGHT X 3 NIGHTS DIFLUCAN 150 MG TABS (FLUCONAZOLE) once daily and repeat in 1 wek if needed TERAZOL 3 80 MG  SUPP (TERCONAZOLE) sig 1 per vagina at bedtime for 3 nights   Current Allergies (reviewed today): SEPTRA (SULFAMETHOXAZOLE-TRIMETHOPRIM) AUGMENTIN (AMOXICILLIN-POT CLAVULANATE) BIAXIN (CLARITHROMYCIN) CEFTIN (CEFUROXIME AXETIL) CIPRO (CIPROFLOXACIN)      History of Present Illness Chief Complaint: Cold & URI symptoms History of Present Illness: Kathern is here for follow up. 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 She reports improvement w/ her lungs and her breathing. Despite my emphasis on getting 3 injections of Rocephin on 3 cosecutive days she was  received two. She improved and then got worse. She has seen Dr. Park Breed and he wants to do a bronchoscopy on 4/08/201. I have told her that I agree w/Dr.Kahn that she needs to have a bronchoscopy done.   Lucresha is teriffied. Apparently she has refused colonoscopy by Dr Juanda Chance due to her fear of colonocopy despite having a long standing HX of colitis. She wants another trial of Rocephin and promises me she will come here all 3 days to make sure she gets the injections and is willing to have this facility use  its own stock.  I have explained that it may not work and that the organism may be resistent. Despite that she is willing to get the 3 injections. She has also promised me that if this fails she will undergo Bronchoscopy in 2 wees on Good Friday.   Current Problems: OTHER SPECIFIED DISORDERS OF ADRENAL GLANDS (ICD-255.8) COUGH (ICD-786.2) BACTERIAL PNEUMONIA (ICD-482.9) PALPITATIONS (ICD-785.1) PALPITATIONS, HX OF (ICD-V12.50) BACTERIAL PNEUMONIA (ICD-482.9) BACTERIAL PNEUMONIA (ICD-482.9) COLITIS, HX OF (ICD-V12.79) CONSTIPATION, CHRONIC (ICD-564.09) IBD (ICD-558.9) MIGRAINE HEADACHE (ICD-346.90) HERPES ZOSTER (ICD-053.9) RHINOSINUSITIS, ALLERGIC (ICD-477.9) UNSPECIFIED DISORDER OF ADRENAL GLANDS (ICD-255.9) POLYARTHRALGIA (ICD-719.49) ANEMIA, IRON DEFICIENCY (ICD-280.9) CROHN'S DISEASE, 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) OSTEOPOROSIS  (ICD-733.00) OSTEOARTHRITIS (ICD-715.90)   Current Meds FAMVIR 250 MG  TABS (FAMCICLOVIR) TAKE 1 by mouth QD NASACORT AQ 55 MCG/ACT  AERS (TRIAMCINOLONE ACETONIDE(NASAL)) USE PRN FERROUS SULFATE 325 (65 FE) MG  TABS (FERROUS SULFATE) TAKE 1 by mouth QD FISH OIL CONCENTRATE 300 MG  CAPS (OMEGA-3 FATTY ACIDS) TAKE 1 by mouth QD FEVERFEW 100 MG  CAPS (FEVERFEW) TAKE 1 by mouth QD HAWTHORN BERRY 500 MG  CAPS (HAWTHORN) TAKE 1 by mouth QD ULTRAM 50 MG  TABS (TRAMADOL HCL) TAKE 1 by mouth once daily PRN ALBUTEROL 90 MCG/ACT  AERS (ALBUTEROL) USE PRN * PTH AND CA++  555.1  PREDNISONE 1 MG  TABS (PREDNISONE) 4 tabs qd OMNARIS 50 MCG/ACT SUSP (CICLESONIDE) 2 puffs am and 2 puffs in pm SUDAFED 30 MG TABS (PSEUDOEPHEDRINE HCL) as directed MUCINEX 600 MG XR12H-TAB (GUAIFENESIN) as directed AZITHROMYCIN 500 MG TABS (AZITHROMYCIN) sig 1 by mouth q day x5 days ROCEPHIN 1 GM SOLR (CEFTRIAXONE SODIUM) sig 1Gm IM x 3 days ROCEPHIN 1 GM SOLR (CEFTRIAXONE SODIUM) sig 1 gram IM up to 3 days ROCEPHIN 1 GM SOLR (CEFTRIAXONE SODIUM) 1 gram IM q day for 3 d ays ROCEPHIN 1 GM SOLR (CEFTRIAXONE SODIUM) sig 1 gram IM x 3 days * IM ROCEPHIN NEEDED PATIENT HAS BEEN DX W/PNEUMONIA AT [facility] VIA CT-SCAN. SHE WAS SEEN ON 03/26&3/27 AND GIVEN A GRAM OF ROCEPHIN. SHE NEDS A THIRD INJECTION OF ROCEPHIN FOR TREATMENT. TERAZOL 3 80 MG SUPP (TERCONAZOLE) SIG 1 PER VAGINA AT NIGHT X 3 NIGHTS DIFLUCAN 150 MG TABS (FLUCONAZOLE) once daily and repeat in 1 wek if needed TERAZOL 3 80 MG SUPP (TERCONAZOLE) sig 1 per vagina at bedtime for 3 nights VALIUM 10 MG TABS (DIAZEPAM) sig 1/2 to 1 by mouth if trouble sleeping  REVIEW OF SYSTEMS Constitutional Symptoms       Complains of fever, chills, and weight loss.     Denies night sweats, weight gain, and fatigue.  Eyes       Denies change in vision, eye pain, eye discharge, glasses, contact lenses, and eye surgery. Ear/Nose/Throat/Mouth       Complains of frequent runny nose.       Denies hearing loss/aids, change in hearing, ear pain, ear discharge, dizziness, frequent nose bleeds, sinus problems, sore throat, hoarseness, and tooth pain or bleeding.  Respiratory       Complains of dry cough and shortness of breath.      Denies productive cough, wheezing, asthma, bronchitis, and emphysema/COPD.  Cardiovascular       Complains of tires easily with exhertion.      Denies murmurs and chest pain.    Gastrointestinal       Denies stomach pain, nausea/vomiting, diarrhea, constipation, blood in bowel movements, and indigestion. Genitourniary       Denies painful urination, kidney stones, and  loss of urinary control. Neurological       Denies paralysis, seizures, and fainting/blackouts. Musculoskeletal       Complains of joint pain and muscle weakness.      Denies muscle pain, joint stiffness, decreased range of motion, redness, swelling, and gout.      Comments: R side pain x 6 dys Skin       Denies bruising, unusual mles/lumps or sores, and hair/skin or nail changes.  Psych       Complains of depression and sleep problems.      Denies mood changes, temper/anger issues, anxiety/stress, and speech problems. Other Comments: Pt has not followed up w/PCP for this.  Farhiya also had cortisol testing which shows a low persitent cortisol level. She reports concern that increase corusone makes her jittery. Ms Ike Bene a former NP in my office gave her 69 1one time and that made her jittery.   Past History:  Past Medical History: Last updated: 02/02/2010 POLYARTHRALGIA (ICD-719.49) ANEMIA, IRON DEFICIENCY (ICD-280.9) CROHN'S DISEASE, 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) OSTEOPOROSIS (ICD-733.00) OSTEOARTHRITIS (ICD-715.90) Allergic rhinitis Asthma Osteopenia Uterine fibroids Genital herpes Hx: iron infusion  Past Surgical History: Last updated: 02/01/2010 Unremarkable  Family History: Last updated: 02/02/2010 Family History of Breast Cancer: Aunt No FH of Colon Cancer: Father:  living: 67 dementia, hypothyroidism Mother: living 32: no major illnesses are known  Social History: Last updated: 02/02/2010 Alcohol Use - no Illicit Drug Use - no Full Time Single  Tobacco Use - No.  Regular Exercise - yes  Risk Factors: Exercise: yes (02/02/2010)  Risk Factors: Smoking Status: never (02/02/2010)  Family History: Reviewed history from 02/02/2010 and no changes required. Family History of Breast Cancer: Aunt No FH of Colon Cancer: Father: living: 55 dementia, hypothyroidism Mother: living 60: no major illnesses are known  Social History: Reviewed history from 02/02/2010 and no changes required. Alcohol Use - no Illicit Drug Use - no Full Time Single  Tobacco Use - No.  Regular Exercise - yes Physical Exam General appearance: ill appearing emaciated W F Head: normocephalic, atraumatic Neck: neck supple,  trachea midline, no masses Chest/Lungs: no rales, wheezes, or rhonchi bilateral, breath sounds equal without effort Heart: regular rate and  rhythm, no murmur Skin: no obvious rashes or lesions MSE: oriented to time, place, and person Assessment New Problems: OTHER SPECIFIED DISORDERS OF ADRENAL GLANDS (ICD-255.8) COUGH (ICD-786.2) BACTERIAL PNEUMONIA (ICD-482.9)  persistant pneummonia  Patient Education: Patient and/or caregiver instructed in the following: rest fluids and Tylenol.  Plan New Medications/Changes: VALIUM 10 MG TABS (DIAZEPAM) sig 1/2 to 1 by mouth if trouble sleeping  #12 x 0, 02/17/2010, Hassan Rowan MD  New Orders: Est. Patient Level IV [16109] Rocephin 250mg  [CPT-J0696] Solumedrol up to 40mg  [J2920] Admin of Therapeutic Inj  intramuscular or subcutaneous [96372] Planning Comments:   Rocephin 1 gram x 3 nights  solumedrol 20 mg tonight and Thursday if tolerated repeat and on Friday  Work/School Excuse: Return to work/school today  The patient and/or caregiver has been counseled thoroughly with regard to medications  prescribed including dosage, schedule, interactions, rationale for use, and possible side effects and they verbalize understanding.  Diagnoses and expected course of recovery discussed and will return if not improved as expected or if the condition worsens. Patient and/or caregiver verbalized understanding.  Prescriptions: VALIUM 10 MG TABS (DIAZEPAM) sig 1/2 to 1 by mouth if trouble sleeping  #12 x 0   Entered and Authorized by:   Hassan Rowan MD   Signed by:  Hassan Rowan MD on 02/17/2010   Method used:   Handwritten   RxID:   [id number]   Patient Instructions: 1)  Patient to come back Thursday and friday night for 1 gram of Rocephin IM  2)  to have 20mg  of cortisol IM toda anf Friday and Thursday only if toleratedtWednesday's injection 3)  Follow up w/ Dr Park Breed let  him know of the changes and her promise to undergo bronchoscopy in 2 weeksif not well.   Medication Administration  Injection # 1:    Medication: Solumedrol up to 40mg     Diagnosis: COUGH (ICD-786.2)    Route: IM    Site: LUOQ gluteus    Exp Date: 06/12/2010    Lot #: [id number]    Mfr: Pharmacia    Comments: Administered 20mg     Patient tolerated injection without complications    Given by: Areta Haber CMA (February 18, 2010 10:35 AM)  Injection # 2:    Medication: Rocephin  250mg     Diagnosis: BACTERIAL PNEUMONIA (ICD-482.9)    Route: IM    Site: RUOQ gluteus    Exp Date: 10/13/2012    Lot #: [id number]    Mfr: Sandox    Comments: Administered 1 gram    Patient tolerated injection without complications    Given by: Areta Haber CMA (February 18, 2010 10:36 AM)  Orders Added: 1)  Est. Patient Level IV [27253] 2)  Rocephin 250mg  [[id number]] 3)  Solumedrol up to 40mg  [J2920] 4)  Admin of Therapeutic Inj  intramuscular or subcutaneous [[id number]]

## 2010-12-15 NOTE — Assessment & Plan Note (Signed)
Summary: acid reflux/constipation/anemic--ch.    History of Present Illness Visit Type: Follow-up Visit Primary GI MD: Lina Sar MD Primary Provider: Nilda Simmer, MD Chief Complaint: GERD at night before she lays down, burning in epigastric; constipation x 1 week - patient has had iron transfusion  12/31/09. History of Present Illness:   Pneumonia 12/15/09 with acute sinusittis and upper respiratory; also flu. Patient treated with multiple rounds of antibiotics x 2 months. 50 year white female with Crohn's disease involving colon since 1993. She has not been following up wih [place] on  regular basis. She is steroid dependent and currently on 20 mg of prednisone twice a day. She has occasional constipation. Last colonoscopy in 1993. She has been  avoiding  recall colonoscopy for many years due to work committements and financial problems  and had a CT scan of the abdomen instead,  in October 2008 which showed no evidence of active Crohn's disease. She was recently found to be anemic, ferritin 6 iron saturation 4%. She has  heavy periods due to uterine fibroids. Her most recent hemoglobin was up to 13.   GI Review of Systems    Reports acid reflux, heartburn, nausea, and  vomiting.      Denies abdominal pain, belching, bloating, chest pain, dysphagia with liquids, dysphagia with solids, loss of appetite, vomiting blood, weight loss, and  weight gain.      Reports constipation and  rectal pain.     Denies anal fissure, black tarry stools, change in bowel habit, diarrhea, diverticulosis, fecal incontinence, heme positive stool, hemorrhoids, irritable bowel syndrome, jaundice, light color stool, liver problems, and  rectal bleeding.    Current Medications (verified): 1)  Famvir 250 Mg  Tabs (Famciclovir) .... Take 1 By Mouth Qd 2)  Nasacort Aq 55 Mcg/act  Aers (Triamcinolone Acetonide(Nasal)) .... Use Prn 3)  Ferrous Sulfate 325 (65 Fe) Mg  Tabs (Ferrous Sulfate) .... Take 1 By Mouth Qd 4)  Fish Oil  Concentrate 300 Mg  Caps (Omega-3 Fatty Acids) .... Take 1 By Mouth Qd 5)  Ultram 50 Mg  Tabs (Tramadol Hcl) .... Take 1 By Mouth Once Daily Prn 6)  Albuterol 90 Mcg/act  Aers (Albuterol) .... Use Prn 7)  Pth and Ca++  555.1 8)  Prednisone 5 Mg Tabs (Prednisone) .... Take 1/2 Tablet Two Times A Day 9)  Omnaris 50 Mcg/act Susp (Ciclesonide) .... 2 Puffs Am and 2 Puffs in Pm 10)  Sudafed 30 Mg Tabs (Pseudoephedrine Hcl) .... As Directed 11)  Mucinex 600 Mg Xr12h-Tab (Guaifenesin) .... As Directed 12)  Azithromycin 500 Mg Tabs (Azithromycin) .... Sig 1 By Mouth Q Day X5 Days 13)  Rocephin 1 Gm Solr (Ceftriaxone Sodium) .... Sig 1gm Im X 3 Days 14)  Rocephin 1 Gm Solr (Ceftriaxone Sodium) .... Sig 1 Gram Im Up To 3 Days 15)  Rocephin 1 Gm Solr (Ceftriaxone Sodium) .Marland Kitchen.. 1 Gram Im Q Day For 3 D Ays 16)  Rocephin 1 Gm Solr (Ceftriaxone Sodium) .... Sig 1 Gram Im X 3 Days 17)  Im Rocephin Needed .... Patient Has Been Dx W/pneumonia At [facility] Via Ct-Scan. She Was Seen On 03/26&3/27 and Given A Gram of Rocephin. She Neds A Third Injection of Rocephin For Treatment. 18)  Terazol 3 80 Mg Supp (Terconazole) .... Sig 1 Per Vagina At Night X 3 Nights 19)  Diflucan 150 Mg Tabs (Fluconazole) .... Once Daily and Repeat in 1 Wek If Needed 20)  Terazol 3 80 Mg Supp (Terconazole) .... Sig  1 Per Vagina At Bedtime For 3 Nights 21)  Valium 10 Mg Tabs (Diazepam) .... Sig 1/2 To 1 By Mouth If Trouble Sleeping 22)  Omnaris 50 Mcg/act Susp (Ciclesonide) .... As Needed  Allergies: 1)  ! Erythromycin 2)  ! Levaquin 3)  Septra (Sulfamethoxazole-Trimethoprim) 4)  Augmentin (Amoxicillin-Pot Clavulanate) 5)  Biaxin (Clarithromycin) 6)  Cipro (Ciprofloxacin)  Past History:  Past Medical History: Reviewed history from 02/02/2010 and no changes required. POLYARTHRALGIA (ICD-719.49) ANEMIA, IRON DEFICIENCY (ICD-280.9) CROHN'S DISEASE, 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) OSTEOPOROSIS (ICD-733.00) OSTEOARTHRITIS  (ICD-715.90) Allergic rhinitis Asthma Osteopenia Uterine fibroids Genital herpes Hx: iron infusion  Past Surgical History: Reviewed history from 02/01/2010 and no changes required. Unremarkable  Family History: Reviewed history from 02/02/2010 and no changes required. Family History of Breast Cancer: Aunt No FH of Colon Cancer: Father: living: 63 dementia, hypothyroidism Mother: living 42: no major illnesses are known  Social History: Reviewed history from 02/02/2010 and no changes required. Alcohol Use - no Illicit Drug Use - no Full Time Single  Tobacco Use - No.  Regular Exercise - yes  Review of Systems       The patient complains of allergy/sinus, anemia, anxiety-new, arthritis/joint pain, back pain, change in vision, cough, fatigue, fever, headaches-new, hearing problems, heart rhythm changes, muscle pains/cramps, night sweats, shortness of breath, sleeping problems, swelling of feet/legs, and urination changes/pain.         Pertinent positive and negative review of systems were noted in the above HPI. All other ROS was otherwise negative.   Vital Signs:  Patient profile:   50 year old female Height:      66 inches Weight:      98.38 pounds BMI:     15.94 Pulse rate:   76 / minute Pulse rhythm:   regular BP sitting:   100 / 64  (left arm) Cuff size:   small  Vitals Entered By: June McMurray CMA Duncan Dull) (February 19, 2010 11:49 AM)  Physical Exam  General:  name alert and oriented Eyes:  nonicteric Mouth:  no aphthous ulcers Neck:  Supple; no masses or thyromegaly. Lungs:  Clear throughout to auscultation. Heart:  Regular rate and rhythm; no murmurs, rubs,  or bruits. Abdomen:  soft relaxed  abdomen with normoactive bowel sounds and mild tenderness in left lower quadrant. No mass, liver egde at costal margin Rectal:  soft Hemoccult negative stool Msk:  Symmetrical with no gross deformities. Normal posture. Extremities:  No clubbing, cyanosis, edema or  deformities noted. Skin:  Intact without significant lesions or rashes. Psych:  Alert and cooperative. Normal mood and affect.   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  Problem # 2:  COLITIS, HX OF (ICD-V12.79) Crohn's disease last colonoscopy 1993. Normal CT scan 2 years ago. She needs a colonoscopy before  end of this year especially in view of her iron deficiency.  Problem # 3:  BACTERIAL PNEUMONIA (ICD-482.9) been treated with antibiotics currently back to work  Patient Instructions: 1)  Your Soma & Prednisone are sent to your pharm for you to pick up. 2)  You are due for a recall colonoscopy 07-2010. 3)  The medication list was reviewed and reconciled.  All changed / newly prescribed medications were explained.  A complete medication list was provided  to the patient / caregiver. Prescriptions: PREDNISONE 5 MG TABS (PREDNISONE) Take 1/2 tablet two times a day  #100 x 3   Entered by:   Christie Nottingham CMA (AAMA)   Authorized by:   Hart Carwin MD   Signed by:   Hart Carwin MD on 02/20/2010   Method used:   Electronically to        [facility] [phone]* (retail)       [address]       [place], [place]  [address]       Ph: [phone] or [phone]       Fax: [phone]   RxID:   [id number] SOMA 350 MG TABS (CARISOPRODOL) take one by mouth three times a day  #90 x 2   Entered and Authorized by:   Hart Carwin MD   Signed by:   Hart Carwin MD on 02/20/2010   Method used:   Electronically to        [facility] [phone]* (retail)       [address] [place], [place]  [address]       Ph: [phone] or [phone]       Fax: [phone]   RxID:   [id number]

## 2010-12-15 NOTE — Letter (Signed)
Summary: External Correspondence  External Correspondence   Imported By: Dannette Barbara 02/18/2010 13:44:03  _____________________________________________________________________  External Attachment:    Type:   Image     Comment:   External Document

## 2010-12-15 NOTE — Assessment & Plan Note (Signed)
Summary: HRT PALPITATIONS/INJECTION/TM   Vital Signs:  Patient Profile:   50 Years Old Female CC:      Inc HR, fatigue x 2 mths Height:     66 inches O2 Sat:      100 % O2 treatment:    Room Air Temp:     97.1 degrees F oral Pulse rate:   71 / minute Pulse rhythm:   regular Resp:     16 per minute BP sitting:   134 / 75  (right arm) Cuff size:   regular  Vitals Entered By: Areta Haber CMA (February 07, 2010 4:29 PM)                  Prior Medication List:  FAMVIR 250 MG  TABS (FAMCICLOVIR) TAKE 1 by mouth QD NASACORT AQ 55 MCG/ACT  AERS (TRIAMCINOLONE ACETONIDE(NASAL)) USE PRN FERROUS SULFATE 325 (65 FE) MG  TABS (FERROUS SULFATE) TAKE 1 by mouth QD FISH OIL CONCENTRATE 300 MG  CAPS (OMEGA-3 FATTY ACIDS) TAKE 1 by mouth QD FEVERFEW 100 MG  CAPS (FEVERFEW) TAKE 1 by mouth QD HAWTHORN BERRY 500 MG  CAPS (HAWTHORN) TAKE 1 by mouth QD ULTRAM 50 MG  TABS (TRAMADOL HCL) TAKE 1 by mouth once daily PRN ALBUTEROL 90 MCG/ACT  AERS (ALBUTEROL) USE PRN * PTH AND CA++  555.1  PREDNISONE 1 MG  TABS (PREDNISONE) 4 tabs qd OMNARIS 50 MCG/ACT SUSP (CICLESONIDE) 2 puffs am and 2 puffs in pm SUDAFED 30 MG TABS (PSEUDOEPHEDRINE HCL) as directed MUCINEX 600 MG XR12H-TAB (GUAIFENESIN) as directed AZITHROMYCIN 500 MG TABS (AZITHROMYCIN) sig 1 by mouth q day x5 days ROCEPHIN 1 GM SOLR (CEFTRIAXONE SODIUM) sig 1Gm IM x 3 days ROCEPHIN 1 GM SOLR (CEFTRIAXONE SODIUM) sig 1 gram IM up to 3 days ROCEPHIN 1 GM SOLR (CEFTRIAXONE SODIUM) 1 gram IM q day for 3 d ays ROCEPHIN 1 GM SOLR (CEFTRIAXONE SODIUM) sig 1 gram IM x 3 days   Current Allergies (reviewed today): SEPTRA (SULFAMETHOXAZOLE-TRIMETHOPRIM) AUGMENTIN (AMOXICILLIN-POT CLAVULANATE) BIAXIN (CLARITHROMYCIN) CEFTIN (CEFUROXIME AXETIL) CIPRO (CIPROFLOXACIN)     History of Present Illness Chief Complaint: Inc HR, fatigue x 2 mths History of Present Illness: Patient is here for follow up of pneumonia she actually feels worse today.  She has been having fatigue for 2 months palpatations that has gotten worse over the last 2 years. She has a Cardiologist appointment next wee that she has movedd until April 15th . She reports that her PCP evaluated her thyroid gland 2 weeks ago and it was normal. There is some pain on the L but it is mostly on the right and the palpatations come and goes.  She also reports having symptoms of dysuria we talk that it could be herpetic or yeast infection starting since she has been on levaquin lately and know Rocephin.  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) UNSPECIFIED DISORDER OF ADRENAL GLANDS (ICD-255.9) POLYARTHRALGIA (ICD-719.49) ANEMIA, IRON DEFICIENCY (ICD-280.9) CROHN'S DISEASE, 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) OSTEOPOROSIS (ICD-733.00) OSTEOARTHRITIS (ICD-715.90)   Current Meds FAMVIR 250 MG  TABS (FAMCICLOVIR) TAKE 1 by mouth QD NASACORT AQ 55 MCG/ACT  AERS (TRIAMCINOLONE ACETONIDE(NASAL)) USE PRN FERROUS SULFATE 325 (65 FE) MG  TABS (FERROUS SULFATE) TAKE 1 by mouth QD FISH OIL CONCENTRATE 300 MG  CAPS (OMEGA-3 FATTY ACIDS) TAKE 1 by mouth QD FEVERFEW 100  MG  CAPS (FEVERFEW) TAKE 1 by mouth QD HAWTHORN BERRY 500 MG  CAPS (HAWTHORN) TAKE 1 by mouth QD ULTRAM 50 MG  TABS (TRAMADOL HCL) TAKE 1 by mouth once daily PRN ALBUTEROL 90 MCG/ACT  AERS (ALBUTEROL) USE PRN * PTH AND CA++  555.1  PREDNISONE 1 MG  TABS (PREDNISONE) 4 tabs qd OMNARIS 50 MCG/ACT SUSP (CICLESONIDE) 2 puffs am and 2 puffs in pm SUDAFED 30 MG TABS (PSEUDOEPHEDRINE HCL) as directed MUCINEX 600 MG XR12H-TAB (GUAIFENESIN) as directed AZITHROMYCIN 500 MG TABS (AZITHROMYCIN) sig 1 by mouth q day x5 days ROCEPHIN 1  GM SOLR (CEFTRIAXONE SODIUM) sig 1Gm IM x 3 days ROCEPHIN 1 GM SOLR (CEFTRIAXONE SODIUM) sig 1 gram IM up to 3 days ROCEPHIN 1 GM SOLR (CEFTRIAXONE SODIUM) 1 gram IM q day for 3 d ays ROCEPHIN 1 GM SOLR (CEFTRIAXONE SODIUM) sig 1 gram IM x 3 days * IM ROCEPHIN NEEDED PATIENT HAS BEEN DX W/PNEUMONIA AT [facility] VIA CT-SCAN. SHE WAS SEEN ON 03/26&3/27 AND GIVEN A GRAM OF ROCEPHIN. SHE NEDS A THIRD INJECTION OF ROCEPHIN FOR TREATMENT. TERAZOL 3 80 MG SUPP (TERCONAZOLE) SIG 1 PER VAGINA AT NIGHT X 3 NIGHTS DIFLUCAN 150 MG TABS (FLUCONAZOLE) once daily and repeat in 1 wek if needed TERAZOL 3 80 MG SUPP (TERCONAZOLE) sig 1 per vagina at bedtime for 3 nights  REVIEW OF SYSTEMS Constitutional Symptoms       Complains of fatigue.     Denies fever, chills, night sweats, weight loss, and weight gain.  Eyes       Denies change in vision, eye pain, eye discharge, glasses, contact lenses, and eye surgery. Ear/Nose/Throat/Mouth       Denies hearing loss/aids, change in hearing, ear pain, ear discharge, dizziness, frequent runny nose, frequent nose bleeds, sinus problems, sore throat, hoarseness, and tooth pain or bleeding.  Respiratory       Denies dry cough, productive cough, wheezing, shortness of breath, asthma, bronchitis, and emphysema/COPD.  Cardiovascular       Complains of tires easily with exhertion.      Denies murmurs and chest pain.      Comments: inc HR x 2 mths   Gastrointestinal       Denies stomach pain, nausea/vomiting, diarrhea, constipation, blood in bowel movements, and indigestion. Genitourniary       Denies painful urination, kidney stones, and loss of urinary control. Neurological       Denies paralysis, seizures, and fainting/blackouts. Musculoskeletal       Denies muscle pain, joint pain, joint stiffness, decreased range of motion, redness, swelling, muscle weakness, and gout.  Skin       Denies bruising, unusual mles/lumps or sores, and hair/skin or nail changes.  Psych        Denies mood changes, temper/anger issues, anxiety/stress, speech problems, depression, and sleep problems. Other Comments: Pt has not seen PCP for this.   Past History:  Past Medical History: Last updated: 02/02/2010 POLYARTHRALGIA (ICD-719.49) ANEMIA, IRON DEFICIENCY (ICD-280.9) CROHN'S DISEASE, 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) OSTEOPOROSIS (ICD-733.00) OSTEOARTHRITIS (ICD-715.90) Allergic rhinitis Asthma Osteopenia Uterine fibroids Genital herpes Hx: iron infusion  Past Surgical History: Last updated: 02/01/2010 Unremarkable  Family History: Last updated: 02/02/2010 Family History of Breast Cancer: Aunt No FH of Colon Cancer: Father: living: 50 dementia, hypothyroidism Mother: living 31: no major illnesses are known  Social History: Last updated: 02/02/2010 Alcohol Use - no Illicit Drug Use - no Full Time Single  Tobacco Use - No.  Regular Exercise - yes  Risk Factors: Exercise: yes (02/02/2010)  Risk Factors: Smoking Status: never (02/02/2010)  Family History: Reviewed history from 02/02/2010 and no changes required. Family History of Breast Cancer: Aunt No FH of Colon Cancer: Father: living: 4 dementia, hypothyroidism Mother: living 16: no major illnesses are known  Social History: Reviewed history from 02/02/2010 and no changes required. Alcohol Use - no Illicit Drug Use - no Full Time Single  Tobacco Use - No.  Regular Exercise - yes Physical Exam General appearance: emaciated W F Head: normocephalic, atraumatic Chest/Lungs: no rales, wheezes, or rhonchi bilateral, breath sounds equal without effort Heart: regular rate and  rhythm, no murmur Extremities: normal extremities Skin: no obvious rashes or lesions MSE: oriented to time, place, and person Assessment New Problems: PALPITATIONS (ICD-785.1) PALPITATIONS, HX OF (ICD-V12.50) CHEST PAIN, ACUTE (ICD-786.50) BACTERIAL PNEUMONIA (ICD-482.9)  palpatation  fatigue    chest pain             pneumonia  Patient Education: Patient and/or caregiver instructed in the following: rest fluids and Tylenol.  Plan New Medications/Changes: IM ROCEPHIN NEEDED PATIENT HAS BEEN DX W/PNEUMONIA AT [facility] VIA CT-SCAN. SHE WAS SEEN ON 03/26&3/27 AND GIVEN A GRAM OF ROCEPHIN. SHE NEDS A THIRD INJECTION OF ROCEPHIN FOR TREATMENT.  #0 x 0, 02/07/2010, Hassan Rowan MD TERAZOL 3 80 MG SUPP (TERCONAZOLE) sig 1 per vagina at bedtime for 3 nights  #3 x 0, 02/07/2010, Hassan Rowan MD DIFLUCAN 150 MG TABS (FLUCONAZOLE) once daily and repeat in 1 wek if needed  #2 x 0, 02/07/2010, Hassan Rowan MD TERAZOL 3 80 MG SUPP (TERCONAZOLE) SIG 1 PER VAGINA AT NIGHT X 3 NIGHTS  #3 x 0, 02/07/2010, Hassan Rowan MD  New Orders: Rocephin  250mg  [[id number]] Admin of Therapeutic Inj  intramuscular or subcutaneous [96372] EKG w/ Interpretation [93000] Est. Patient Level III [60454] Planning Comments:   If symptoms worse go to the local ED CONTINUE W/ CURRENT ANTIBIOTIC PLAN TO TREAT PNEUMONIA NEED 3RD INJECTION OF ROCEPHIN TOMORROW  Follow Up: Follow up on an as needed basis, Follow up with Primary Physician  The patient and/or caregiver has been counseled thoroughly with regard to medications prescribed including dosage, schedule, interactions, rationale for use, and possible side effects and they verbalize understanding.  Diagnoses and expected course of recovery discussed and will return if not improved as expected or if the condition worsens. Patient and/or caregiver verbalized understanding.   PROCEDURE: Follow up: EKG some sinus arrthymia that willl require cardiology follow up Prescriptions: IM ROCEPHIN NEEDED PATIENT HAS BEEN DX W/PNEUMONIA AT [facility] VIA CT-SCAN. SHE WAS SEEN ON 03/26&3/27 AND GIVEN A GRAM OF ROCEPHIN. SHE NEDS A THIRD INJECTION OF ROCEPHIN FOR TREATMENT.  #0 x 0   Entered and Authorized by:   Hassan Rowan MD   Signed by:   Hassan Rowan MD on 02/07/2010   Method used:   Printed then faxed to ...        [facility] [phone]* (retail)       [address] [place], [place]  [address]       Ph: [phone] or [phone]       Fax: [phone]   RxID:   [id number] TERAZOL 3 80 MG SUPP (TERCONAZOLE) sig 1 per vagina at bedtime for 3 nights  #3 x 0   Entered and Authorized by:   Hassan Rowan MD   Signed by:   Hassan Rowan  MD on 02/07/2010   Method used:   Printed then faxed to ...       [facility] [phone]* (retail)       [address] [place], [place]  [address]       Ph: [phone] or [phone]       Fax: [phone]   RxID:   [id number] DIFLUCAN 150 MG TABS (FLUCONAZOLE) once daily and repeat in 1 wek if needed  #2 x 0   Entered and Authorized by:   Hassan Rowan MD   Signed by:   Hassan Rowan MD on 02/07/2010   Method used:   Printed then faxed to ...       [facility] [phone]* (retail)       [address] [place], [place]  [address]       Ph: [phone] or [phone]       Fax: [phone]   RxID:   [id number] TERAZOL 3 80 MG SUPP (TERCONAZOLE) SIG 1 PER VAGINA AT NIGHT X 3 NIGHTS  #3 x 0   Entered and Authorized by:   Hassan Rowan MD   Signed by:   Hassan Rowan MD on 02/07/2010   Method used:   Electronically to        [facility] [phone]* (retail)       [address] [place], [place]  [address]       Ph: [phone] or [phone]       Fax: [phone]   RxID:   [id number]   Patient Instructions: 1)  WILL NEED 3RD INJECTION OF ROCEPHIN  2)  CONTINUE WZITHROMAX AS PRESCRIBED  3)  PLEASE FOLLOW UP W/PCP AND PULMONOLOGIST AS DISCUSSED 4)  IF PALPATATIONS AND OR CHEST DISCOMFORT GETS WORSE FOLLOW UP AT LOCAL ED 5)  MAY USE DIFLUCAN OR TERAZOL VAG SUPPOSITORY FOR YEAST   Medication Administration  Injection # 1:    Medication: Rocephin  250mg     Diagnosis: BACTERIAL PNEUMONIA (ICD-482.9)    Route: IM    Site: RUOQ gluteus    Exp Date:  10/13/2012    Lot #: [id number]    Mfr: Sandox    Comments: Administered 1 gm    Patient tolerated injection without complications    Given by: Areta Haber CMA (February 07, 2010 5:19 PM)  Orders Added: 1)  Rocephin  250mg  [[id number]] 2)  Admin of Therapeutic Inj  intramuscular or subcutaneous [96372] 3)  EKG w/ Interpretation [93000] 4)  Est. Patient Level III [01751]

## 2010-12-16 NOTE — Letter (Signed)
Summary: Work Paediatric nurse [facility]  [address]   [place], [place] [address]   Phone: [phone]  Fax: [phone]    Today's Date: December 11, 2010  Name of Patient: [name] [name]  The above named patient had a medical visit today at:  am / pm.  Please take this into consideration when reviewing the time away from work/school.    Special Instructions:  [  ] None  [  ] To be off the remainder of today, returning to the normal work / school schedule tomorrow.  [ x ] To be off until the next scheduled appointment on ________01/30/2012______________.  [  ] Other _Patient has beeen sick for several days until seen today_____________________________________________________________ ________________________________________________________________________   Sincerely yours,   Hassan Rowan MD

## 2010-12-16 NOTE — Assessment & Plan Note (Signed)
Summary: COUGHING,SOB,CHEST HURTS, SCRATCHY THROAT/JBB   Vital Signs:  Patient profile:   50 year old female Height:      66.5 inches Weight:      99 pounds O2 Sat:      100 % on Room air Temp:     98.0 degrees F oral Pulse rate:   87 / minute Pulse rhythm:   regular Resp:     20 per minute BP sitting:   114 / 69  (right arm)  Vitals Entered By: Levonne Spiller EMT-P (October 31, 2010 2:05 PM)  O2 Flow:  Room air Is Patient Diabetic? No Pain Assessment Patient in pain? yes     Location: Generalized Intensity: 7 Type: aching Onset of pain  Gradual  Does patient need assistance? Functional Status Self care Ambulation Normal   History of Present Illness: Patient recieved her IM solumedrol dosages at home w/her mother help. Her stamina and breathing was doing better until they did some cleaning w/clorox and other toxic fumes on Thursday 10/28/2010. She has been coughing the last 3 days. Discuss will continue w/recieving IM depro-medrol at home.  Allergies: 1)  ! Erythromycin 2)  ! Levaquin 3)  Septra 4)  Augmentin (Amoxicillin-Pot Clavulanate) 5)  Biaxin (Clarithromycin) 6)  Cipro (Ciprofloxacin)  Family History: Reviewed history from 02/02/2010 and no changes required. Family History of Breast Cancer: Aunt No FH of Colon Cancer: Father: living: 37 dementia, hypothyroidism Mother: living 7: no major illnesses are known  Social History: Reviewed history from 02/02/2010 and no changes required. Alcohol Use - no Illicit Drug Use - no Full Time Single  Tobacco Use - No.  Regular Exercise - yes  Review of Systems General:  Complains of sleep disorder; denies chills, fatigue, fever, loss of appetite, malaise, sweats, weakness, and weight loss. Eyes:  Complains of blurring and light sensitivity; denies discharge, double vision, eye irritation, eye pain, halos, itching, red eye, vision loss-1 eye, and vision loss-both eyes. ENT:  Complains of earache, hoarseness,  sinus pressure, and sore throat; denies decreased hearing, difficulty swallowing, ear discharge, nasal congestion, nosebleeds, postnasal drainage, and ringing in ears. CV:  Complains of fatigue; denies bluish discoloration of lips or nails, chest pain or discomfort, difficulty breathing at night, difficulty breathing while lying down, fainting, leg cramps with exertion, lightheadness, near fainting, palpitations, shortness of breath with exertion, swelling of feet, swelling of hands, and weight gain. Resp:  Complains of cough; denies chest discomfort, chest pain with inspiration, coughing up blood, excessive snoring, hypersomnolence, morning headaches, pleuritic, shortness of breath, sputum productive, and wheezing; Dry Cough. GI:  Denies abdominal pain, bloody stools, change in bowel habits, constipation, dark tarry stools, diarrhea, excessive appetite, gas, hemorrhoids, indigestion, loss of appetite, nausea, vomiting, vomiting blood, and yellowish skin color. GU:  Denies abnormal vaginal bleeding, decreased libido, discharge, dysuria, genital sores, hematuria, incontinence, nocturia, urinary frequency, and urinary hesitancy. MS:  Complains of joint pain, muscle aches, and muscle weakness; denies joint redness, joint swelling, loss of strength, low back pain, mid back pain, muscle , cramps, stiffness, and thoracic pain. Derm:  Denies changes in color of skin, changes in nail beds, dryness, excessive perspiration, flushing, hair loss, insect bite(s), itching, lesion(s), poor wound healing, and rash. Neuro:  Denies brief paralysis, difficulty with concentration, disturbances in coordination, falling down, headaches, inability to speak, memory loss, numbness, poor balance, seizures, sensation of room spinning, tingling, tremors, visual disturbances, and weakness. Psych:  Complains of anxiety; denies alternate hallucination ( auditory/visual), depression, easily angered, easily  tearful, irritability, mental  problems, panic attacks, sense of great danger, suicidal thoughts/plans, thoughts of violence, unusual visions or sounds, and thoughts /plans of harming others. Endo:  Denies cold intolerance, excessive hunger, excessive thirst, excessive urination, heat intolerance, polyuria, and weight change. Heme:  Denies abnormal bruising, bleeding, enlarge lymph nodes, fevers, pallor, and skin discoloration. Allergy:  Denies hives or rash, itching eyes, persistent infections, seasonal allergies, and sneezing.  Physical Exam  Neck:  supple.   Chest Wall:  no deformities.   Lungs:  normal respiratory effort, no crackles, and no wheezes.   patient actively coughing during exam but nosigns of bronchospasm is currently present Heart:  normal rate and regular rhythm.     Impression & Recommendations:  Problem # 1:  persistent cough  Problem # 2:  reactive airways  Complete Medication List: 1)  Famvir 250 Mg Tabs (Famciclovir) .... Take 1 by mouth qd 2)  Ferrous Sulfate 325 (65 Fe) Mg Tabs (Ferrous sulfate) .... Take 1 by mouth qd 3)  Fish Oil Concentrate 300 Mg Caps (Omega-3 fatty acids) .... Take 1 by mouth qd 4)  Ultram 50 Mg Tabs (Tramadol hcl) .... Take 1 by mouth once daily as needed patient takes 1/2 tablet if needed 5)  Albuterol 90 Mcg/act Aers (Albuterol) .... Use prn 6)  Pth and Ca++ 555.1  7)  Prednisone 5 Mg Tabs (Prednisone) .... Take 1/2 tablet two times a day 8)  Omnaris 50 Mcg/act Susp (Ciclesonide) .... 2 puffs am and 2 puffs in pm 9)  Sudafed 30 Mg Tabs (Pseudoephedrine hcl) .... As directed 10)  Mucinex 600 Mg Xr12h-tab (Guaifenesin) .... As directed 11)  Terazol 3 80 Mg Supp (Terconazole) .... Sig 1 per vagina at bedtime for 3 nights 12)  Omnaris 50 Mcg/act Susp (Ciclesonide) .... As needed 13)  Soma 350 Mg Tabs (Carisoprodol) .... Take one by mouth three times a day 14)  Combivent 103-18 Mcg/act Aero (Albuterol-ipratropium) .... 2 inh. q4-6h as needed 15)  Tussionex Pennkinetic  Er 8-10 Mg/33ml Lqcr (Chlorpheniramine-hydrocodone) .... Sig 1/4 to 1/2 tsp by mouth 2x aday as needed for cough 16)  Cefuroxime Axetil 500 Mg Tabs (Cefuroxime axetil) .Marland Kitchen.. 1 by mouth 2 times daily  Patient Instructions: 1)  will give IM depomedrol w/ 20mg  today and patient to take a second dose on Monday  2)  recommend once coughing spell is better to go to the gym twice a week for 30 minutes  3)  Please schedule a follow-up appointment in 1 month. 4)  Please schedule an appointment with your primary doctor in :1 month 5)  Patient may take her ceftin antibiotic if cough persist Will give her a scripif needed. Prescriptions: CEFUROXIME AXETIL 500 MG  TABS (CEFUROXIME AXETIL) 1 by mouth 2 times daily  #20 x 0   Entered and Authorized by:   Hassan Rowan MD   Signed by:   Hassan Rowan MD on 10/31/2010   Method used:   Printed then faxed to ...       [facility] [phone]* (retail)       [address] [place], [place]  [address]       Ph: [phone] or [phone]       Fax: [phone]   RxID:   [id number] Sandria Senter ER 8-10 MG/5ML LQCR (CHLORPHENIRAMINE-HYDROCODONE) Sig 1/4 to 1/2 tsp by mouth 2x aday as needed for cough  #4  floz x 0   Entered and Authorized by:   Hassan Rowan MD   Signed by:   Hassan Rowan MD on 10/31/2010   Method used:   Printed then faxed to ...       [facility] [phone]* (retail)       [address] [place], [place]  [address]       Ph: [phone] or [phone]       Fax: [phone]   RxID:   [id number]    Medication Administration  Injection # 1:    Medication: depo- Medrol 2.5mg     Diagnosis: REACTIVE AIRWAY DISEASE (ICD-493.90)    Route: IM    Site: L deltoid    Exp Date: 06/14/2011    Lot #: [id number]    Mfr: PFIZER    Patient tolerated injection without complications    Given by: Levonne Spiller EMT-P (October 31, 2010 3:13 PM)  Orders Added: 1)  Est. Patient Level IV  [56387]

## 2010-12-16 NOTE — Assessment & Plan Note (Signed)
Summary: cough,congestion,aching all over, headache/jbb   Vital Signs:  Patient profile:   50 year old female Height:      66.5 inches Weight:      95 pounds O2 Sat:      99 % on Room air Temp:     97.7 degrees F oral Pulse rate:   88 / minute Pulse rhythm:   regular Resp:     22 per minute BP sitting:   100 / 60  (right arm) Cuff size:   small  Vitals Entered By: Providence Crosby LPN (November 28, 2010 2:13 PM)  Primary Provider:  Dr. Dan Humphreys [facility]  CC:  cough, chronic, and but recently worse.Marland Kitchen  History of Present Illness: cough,started worse on 11/25/2010 past wiping tables down at work with clorox; Complains of nausea without emesis; headache and aching all over. old chart visits x 3 since Oct 2011  reviewed. patient thinks that she is very allergic to clorox used to decontaminated surfaces at work at [employer] and thinks that employer is using more of it in an attempt to get her to quit. Says that she wheezes but hasn't taken as needed combivent, just usual albuterol. Has multiply pos ros but all seem to be chronic and only sl worse than usual.  Allergies: 1)  ! Erythromycin 2)  ! Levaquin 3)  Septra 4)  Augmentin (Amoxicillin-Pot Clavulanate) 5)  Biaxin (Clarithromycin) 6)  Cipro (Ciprofloxacin)  Past History:  Past Surgical History: Last updated: 02/01/2010 Unremarkable  Family History: Last updated: 02/02/2010 Family History of Breast Cancer: Aunt No FH of Colon Cancer: Father: living: 44 dementia, hypothyroidism Mother: living 33: no major illnesses are known  Social History: Last updated: 02/02/2010 Alcohol Use - no Illicit Drug Use - no Full Time Single  Tobacco Use - No.  Regular Exercise - yes  Past Medical History: Reviewed history from 02/02/2010 and no changes required. 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   General:  Complains of fatigue, fever, loss of appetite, malaise, sleep disorder, sweats, weakness, and weight loss; denies chills. Eyes:  Complains of blurring, eye pain, itching, and light sensitivity. ENT:  lips burning when they put clorox on tables 11/25/2010 tongue felt as if swelling. CV:  Complains of chest pain or discomfort, difficulty breathing at night, fatigue, and lightheadness; denies bluish discoloration of lips or nails, fainting, swelling of feet, and swelling of hands. Resp:  Complains of chest discomfort, cough, morning headaches, shortness of breath, and wheezing; denies chest pain with inspiration and coughing up blood. GI:  Complains of abdominal pain, change in bowel habits, diarrhea, gas, hemorrhoids, indigestion, loss of appetite, and nausea; denies bloody stools, constipation, dark tarry stools, and vomiting; pain is crampy, mid and rlq and no worse than usual. patient's wt is down at most 6 lbs in 6 mos. MS:  Complains of muscle weakness; denies joint pain, joint redness, joint swelling, and stiffness. Neuro:  Complains of headaches; chronic and worse with bad odors. Psych:  Complains of anxiety; denies depression and easily angered.  Physical Exam  General:  thin habitus,,in no acute distress; alert,appropriate and cooperative throughout examination. only coughed once during exam. Head:  Normocephalic and atraumatic without obvious abnormalities. Eyes:  No corneal or conjunctival inflammation noted. EOMI.  Ears:  External ear exam shows no significant lesions or deformities.  Otoscopic examination reveals clear canals, tympanic membranes are intact bilaterally without bulging, retraction, inflammation or discharge.  Hearing is grossly normal bilaterally. Nose:  External nasal examination shows no deformity or inflammation. Nasal mucosa are pink and moist without lesions or exudates. Mouth:  Oral mucosa and oropharynx without lesions or exudates.  post pharynx with cobblestone appearance. Teeth in good repair. Neck:  No deformities, masses, or tenderness noted. Lungs:  Normal respiratory effort, chest expands symmetrically. Lungs are clear to auscultation, no crackles or wheezes including forced exhale. Abdomen:  soft, non-tender, normal bowel sounds, no distention, no guarding, no rebound tenderness, but mild epigastric tenderness, and RLQ tenderness.   Extremities:  No clubbing, cyanosis, edema, or deformity noted .   Neurologic:  No cranial nerve deficits noted. Station and gait are normal. Sensory, motor and coordinative functions appear intact. Psych:  Cognition appears intact. Alert and cooperative with normal attention span and concentration. No apparent delusions, illusions, hallucinations but note possible paranoid opionions about employer   Problems:  Medical Problems Added: 1)  Dx of Allergy Unspecified Not Elsewhere Classified  (ICD-995.3)  Complete Medication List: 1)  Famvir 250 Mg Tabs (Famciclovir) .... Take 1 by mouth qd 2)  Ferrous Sulfate 325 (65 Fe) Mg Tabs (Ferrous sulfate) .... Take 1 by mouth qd 3)  Fish Oil Concentrate 300 Mg Caps (Omega-3 fatty acids) .... Take 1 by mouth qd 4)  Ultram 50 Mg Tabs (Tramadol hcl) .... Take 1 by mouth once daily as needed patient takes 1/2 tablet if needed 5)  Albuterol 90 Mcg/act Aers (Albuterol) .... Use prn 6)  Prednisone 5 Mg Tabs (Prednisone) .... Take 1/2 tablet two times a day 7)  Sudafed 30 Mg Tabs (Pseudoephedrine hcl) .... As directed 8)  Mucinex 600 Mg Xr12h-tab (Guaifenesin) .... As directed 9)  Omnaris 50 Mcg/act Susp (Ciclesonide) .... As needed 10)  Soma 350 Mg Tabs (Carisoprodol) .... Take one by mouth three times a day 11)   Combivent 103-18 Mcg/act Aero (Albuterol-ipratropium) .... 2 inh. q4-6h as needed 12)  Fluticasone Propionate 50 Mcg/act Susp (Fluticasone propionate) .... 2 sprays each nostril daily  Patient Instructions: 1)  consult pulmonary about possible reactive airways to chlorine. Try nasal spray in case chronic cough is due to post nasal drip resulting from inhaled/nasal allergies Prescriptions: FLUTICASONE PROPIONATE 50 MCG/ACT SUSP (FLUTICASONE PROPIONATE) 2 sprays each nostril daily  #1 cannister x 2   Entered and Authorized by:   J. Juline Patch MD   Signed by:   Shela Commons. Juline Patch MD on 11/28/2010   Method used:   Electronically to        [facility] [phone]* (retail)       [address] [place], [place]  [address]       Ph: [phone]       Fax: [phone]   RxID:   [id number]

## 2010-12-22 NOTE — Assessment & Plan Note (Signed)
Summary: cough,congestion/jbb   Vital Signs:  Patient profile:   50 year old female Height:      66.5 inches Weight:      95 pounds O2 Sat:      100 % on Room air Pulse rate:   83 / minute Resp:     16 per minute BP sitting:   119 / 76  (left arm)  Vitals Entered By: Adella Hare LPN (December 11, 2010 11:31 AM)  O2 Flow:  Room air Physical Exam General appearance: alert, well-nourished, and cachetic.   Neck: supple and no masses.   Chest/Lungs: lungs clear,CVS S1&S2, actively coughing  Neurological: alert & oriented X3, cranial nerves II-XII intact, and strength normal in all extremities.   Skin: no obvious rashes or lesions MSE: oriented to time, place, and person  CC: cough, congestion, headache, body aches, diarrhea x 5 days Is Patient Diabetic? No   Current Medications (verified): 1)  Famvir 250 Mg  Tabs (Famciclovir) .... Take 1 By Mouth Qd 2)  Ferrous Sulfate 325 (65 Fe) Mg  Tabs (Ferrous Sulfate) .... Take 1 By Mouth Qd 3)  Fish Oil Concentrate 300 Mg  Caps (Omega-3 Fatty Acids) .... Take 1 By Mouth Qd 4)  Ultram 50 Mg  Tabs (Tramadol Hcl) .... Take 1 By Mouth Once Daily As Needed Patient Takes 1/2 Tablet If Needed 5)  Albuterol 90 Mcg/act  Aers (Albuterol) .... Use Prn 6)  Prednisone 5 Mg Tabs (Prednisone) .... Take 1/2 Tablet Two Times A Day 7)  Sudafed 30 Mg Tabs (Pseudoephedrine Hcl) .... As Directed 8)  Mucinex 600 Mg Xr12h-Tab (Guaifenesin) .... As Directed 9)  Omnaris 50 Mcg/act Susp (Ciclesonide) .... As Needed 10)  Soma 350 Mg Tabs (Carisoprodol) .... Take One By Mouth Three Times A Day 11)  Combivent 103-18 Mcg/act Aero (Albuterol-Ipratropium) .... 2 Inh. Q4-6h As Needed 12)  Fluticasone Propionate 50 Mcg/act Susp (Fluticasone Propionate) .... 2 Sprays Each Nostril Daily  Allergies (verified): 1)  ! Erythromycin 2)  ! Levaquin 3)  Septra 4)  Augmentin (Amoxicillin-Pot Clavulanate) 5)  Biaxin (Clarithromycin) 6)  Cipro (Ciprofloxacin) History of  Present Illness Chief Complaint: cough, congestion, headache, body aches, diarrhea x 5 days History of Present Illness:  Patient has had a difficult time the last 2 weeks.  She reports that her work place has been using more cleaning agents w/toxic fumes. She states that despite her request and going to the HR department she is coughing more chest congestion , and feeling misable. She went to the [facility] urgent care earlier this week. Apparently they initially were no going to see her but then did and found her  BP 134/49. This was aday aftr=er cleaning and she felt light =headed and dizzy. They did offer her zithromax but due to her diarrhea she declined. She is her today w/cough, congestion and general weaknesss.   Current Problems: ALLERGY UNSPECIFIED NOT ELSEWHERE CLASSIFIED (ICD-995.3) COUGH (ICD-786.2) ? 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) OSTEOPOROSIS (ICD-733.00) OSTEOARTHRITIS (ICD-715.90)   Current Meds FAMVIR 250 MG  TABS (FAMCICLOVIR) TAKE 1 by mouth QD FERROUS SULFATE 325 (65 FE) MG  TABS (FERROUS SULFATE) TAKE  1 by mouth QD FISH OIL CONCENTRATE 300 MG  CAPS (OMEGA-3 FATTY ACIDS) TAKE 1 by mouth QD ULTRAM 50 MG  TABS (TRAMADOL HCL) TAKE 1 by mouth once daily as needed patient takes 1/2 tablet if needed ALBUTEROL 90 MCG/ACT  AERS (ALBUTEROL) USE PRN PREDNISONE 5 MG TABS (PREDNISONE) Take 1/2 tablet two times  a day SUDAFED 30 MG TABS (PSEUDOEPHEDRINE HCL) as directed MUCINEX 600 MG XR12H-TAB (GUAIFENESIN) as directed OMNARIS 50 MCG/ACT SUSP (CICLESONIDE) as needed SOMA 350 MG TABS (CARISOPRODOL) take one by mouth three times a day COMBIVENT 103-18 MCG/ACT AERO (ALBUTEROL-IPRATROPIUM) 2 inh. 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  Regular Exercise - yes  Risk Factors: Exercise: yes (02/02/2010)  Risk Factors: Smoking Status: never (08/28/2010)  Family History: Reviewed history from 02/02/2010 and no changes required. Family History of Breast Cancer: Aunt No FH of Colon Cancer: Father: living: 80 dementia, hypothyroidism Mother: living 61: no major illnesses are known  Social History: Reviewed history from 02/02/2010 and no changes required. Alcohol Use - no Illicit Drug Use - no Full Time Single  Tobacco Use - No.  Regular Exercise - yes  Physical Exam  General:  alert, well-nourished, and cachetic.   Head:  normocephalic.   Mouth:  pharynx pink and moist and fair dentition.   Neck:  supple and no masses.   Chest Wall:  no deformities.   Lungs:  normal breath sounds and no wheezes.   but actively coughing now Heart:  normal rate and  regular rhythm.   Neurologic:  alert & oriented X3, cranial nerves II-XII intact, and strength normal in all extremities.     Impression & Recommendations:  Problem # 1:  BRONCHITIS, ACUTE WITH BRONCHOSPASM (ICD-466.0)  Her updated medication list for this problem includes:    Albuterol 90 Mcg/act Aers (Albuterol) ..... Use prn    Mucinex 600 Mg Xr12h-tab (Guaifenesin) .Marland Kitchen... As directed    Combivent 103-18 Mcg/act Aero (Albuterol-ipratropium) .Marland Kitchen... 2 inh. q4-6h as needed Will give Rocephin 1 gram IM X 3 days  Orders: Rocephin 250mg  ([id number]) Depo-Medrol 20mg  (J1020) Admin of Therapeutic Inj  intramuscular or subcutaneous (54098)  Problem # 2:  adrenal insufficiency Assessment: Deteriorated depromedrol  will give depromedrol 20 mg IM next 4-8 days.Will give the .25ml injections over 4-8 days by giving her the vial today and tomorrow. due to heightened anxiety per patient requestbwil try her on ativan .5mg  by mouth #20  Patient Instructions: 1)  return tomorrow and Monday for 1 gram of Rocephin IM  2)  and repeat .25ml injection of Depomedrol or 20 mg IM 3)  Please schedule a follow-up appointment as needed. 4)  Take your antibiotic as prescribed until ALL of it is gone, but stop if you develop a rash or swelling and contact our office as soon as possible. Prescriptions: ATIVAN 0.5 MG  TABS (LORAZEPAM)   #30  x 0   Entered and Authorized by:   Hassan Rowan MD   Signed by:   Hassan Rowan MD on 12/11/2010   Method used:   Print then Give to Patient   RxID:   [id number]    Medication Administration  Injection # 1:    Medication: Rocephin  250mg     Diagnosis: BRONCHITIS, ACUTE WITH BRONCHOSPASM (ICD-466.0)    Route: IM    Site: RUOQ gluteus    Exp Date: 03/14/2013    Lot #: [id number]    Mfr: hospira    Comments: rocephin 500mg  given each hip    Patient tolerated injection without complications    Given by: Adella Hare LPN (December 11, 2010 1:19 PM)  Injection # 2:     Medication: Depo-Medrol 20mg     Diagnosis: BRONCHITIS, ACUTE WITH BRONCHOSPASM (ICD-466.0)    Route: IM    Site: RUOQ gluteus    Exp Date: 07/12    Lot #: Tommas Olp    Mfr: Pharmacia    Patient tolerated injection without complications    Given by: Adella Hare LPN (December 11, 2010 1:19 PM)  Orders Added: 1)  Est. Patient Level IV [27253] 2)  Rocephin 250mg  [[id number]] 3)  Depo-Medrol 20mg  [J1020] 4)  Admin of Therapeutic Inj  intramuscular or subcutaneous [96372]     Medication Administration  Injection # 1:    Medication: Rocephin  250mg     Diagnosis: BRONCHITIS, ACUTE WITH BRONCHOSPASM (ICD-466.0)    Route: IM    Site: RUOQ gluteus    Exp Date: 03/14/2013    Lot #: [id number]    Mfr: hospira    Comments: rocephin 500mg  given each hip    Patient tolerated injection without complications    Given by: Adella Hare LPN (December 11, 2010 1:19 PM)  Injection # 2:    Medication: Depo-Medrol 20mg     Diagnosis: BRONCHITIS, ACUTE WITH BRONCHOSPASM (ICD-466.0)    Route: IM    Site: RUOQ gluteus    Exp Date: 07/12    Lot #: Tommas Olp    Mfr: Pharmacia    Patient tolerated injection without complications    Given by: Adella Hare LPN (December 11, 2010 1:19 PM)  Orders Added: 1)  Est. Patient Level IV [47425] 2)  Rocephin 250mg  [[id number]-J0696] 3)  Depo-Medrol 20mg  [J1020] 4)  Admin of Therapeutic Inj  intramuscular or subcutaneous [95638]

## 2010-12-22 NOTE — Letter (Signed)
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    Special Instructions:  [  ] None  [  ] To be off the remainder of today, returning to the normal work / school schedule tomorrow.  [  ] To be off until the next scheduled appointment on ______________________.  [  x] Other may return to work on o1/31/2012________________________________________________________________ ________________________________________________________________________   Sincerely yours,   Hassan Rowan MD

## 2010-12-22 NOTE — Assessment & Plan Note (Signed)
Summary: SEEN YESTERDAY,SHE IS WORST TODAY/JBB   Vital Signs:  Patient profile:   50 year old female O2 Sat:      100 % on Room air Pulse rate:   851 / minute BP sitting:   131 / 69  (left arm)  Vitals Entered By: Adella Hare LPN (December 12, 2010 2:43 PM)  O2 Flow:  Room air Physical Exam General appearance: well developed, ill appearing WF  Head: normocephalic, atraumatic Neck: neck supple,  trachea midline, no masses Chest/Lungs: actively coughing  Heart: regular rate and  rhythm, no murmur Extremities: normal extremities Skin: no obvious rashes or lesions MSE: oriented to time, place, and person  CC: patient states she is still not feeling well and is having dizzy spells  History of Present Illness Chief Complaint: patient states she is still not feeling well and is having dizzy spells History of Present Illness: She states that she is feeking worse today. She recieved a very small dose of depomedrol yesterday but refused a greater dosage due to her HX of reaction to 40 mg of depomedrol before intyhe past. She rerports feeling light headed and wanting to faint.  Current Problems: ALLERGY UNSPECIFIED NOT ELSEWHERE CLASSIFIED (ICD-995.3) COUGH (ICD-786.2) ? 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) OSTEOPOROSIS (ICD-733.00) OSTEOARTHRITIS (ICD-715.90)   Current Meds FAMVIR 250 MG  TABS (FAMCICLOVIR)  TAKE 1 by mouth QD FERROUS SULFATE 325 (65 FE) MG  TABS (FERROUS SULFATE) TAKE 1 by mouth QD FISH OIL CONCENTRATE 300 MG  CAPS (OMEGA-3 FATTY ACIDS) TAKE 1 by mouth QD ULTRAM 50 MG  TABS (TRAMADOL HCL) TAKE 1 by mouth once daily as needed patient takes 1/2 tablet if needed ALBUTEROL 90 MCG/ACT  AERS (ALBUTEROL) USE PRN PREDNISONE 5 MG TABS (PREDNISONE) Take 1/2 tablet two times a day SUDAFED 30 MG TABS (PSEUDOEPHEDRINE HCL) as directed MUCINEX 600 MG XR12H-TAB (GUAIFENESIN) as directed OMNARIS 50 MCG/ACT SUSP (CICLESONIDE) as needed SOMA 350 MG TABS (CARISOPRODOL) take one by mouth three times a day COMBIVENT 103-18 MCG/ACT AERO (ALBUTEROL-IPRATROPIUM) 2 inh. q4-6h as needed FLUTICASONE PROPIONATE 50 MCG/ACT SUSP (FLUTICASONE PROPIONATE) 2 sprays each nostril daily ATIVAN 0.5 MG  TABS (LORAZEPAM)  ZITHROMAX Z-PAK 250 MG  TABS (AZITHROMYCIN) Use as directed    Allergies: 1)  ! Erythromycin 2)  ! Levaquin 3)  Septra 4)  Augmentin (Amoxicillin-Pot Clavulanate) 5)  Biaxin (Clarithromycin) 6)  Cipro (Ciprofloxacin)  Past History:  Past Medical History: Last updated: 02/02/2010 POLYARTHRALGIA (ICD-719.49) ANEMIA, IRON DEFICIENCY (ICD-280.9) CROHN'S DISEASE, 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) OSTEOPOROSIS (ICD-733.00) OSTEOARTHRITIS (ICD-715.90) Allergic rhinitis Asthma Osteopenia Uterine fibroids Genital herpes Hx: iron infusion  Past Surgical History: Last updated: 02/01/2010 Unremarkable  Family History: Last updated: 02/02/2010 Family History of Breast Cancer: Aunt No FH of Colon Cancer: Father: living: 66 dementia, hypothyroidism Mother: living 74: no major illnesses are known  Social History: Last updated: 02/02/2010 Alcohol Use - no Illicit Drug Use - no Full Time Single  Tobacco Use - No.  Regular Exercise - yes  Risk Factors: Exercise: yes (02/02/2010)  Risk Factors: Smoking Status: never (08/28/2010)  Family History: Reviewed history from 02/02/2010 and  no changes required. Family History of  Breast Cancer: Aunt No FH of Colon Cancer: Father: living: 82 dementia, hypothyroidism Mother: living 55: no major illnesses are known  Social History: Reviewed history from 02/02/2010 and no changes required. Alcohol Use - no Illicit Drug Use - no Full Time Single  Tobacco Use - No.  Regular Exercise - yes   Complete Medication List: 1)  Famvir 250 Mg Tabs (Famciclovir) .... Take 1 by mouth qd 2)  Ferrous Sulfate 325 (65 Fe) Mg Tabs (Ferrous sulfate) .... Take 1 by mouth qd 3)  Fish Oil Concentrate 300 Mg Caps (Omega-3 fatty acids) .... Take 1 by mouth qd 4)  Ultram 50 Mg Tabs (Tramadol hcl) .... Take 1 by mouth once daily as needed patient takes 1/2 tablet if needed 5)  Albuterol 90 Mcg/act Aers (Albuterol) .... Use prn 6)  Prednisone 5 Mg Tabs (Prednisone) .... Take 1/2 tablet two times a day 7)  Sudafed 30 Mg Tabs (Pseudoephedrine hcl) .... As directed 8)  Mucinex 600 Mg Xr12h-tab (Guaifenesin) .... As directed 9)  Omnaris 50 Mcg/act Susp (Ciclesonide) .... As needed 10)  Soma 350 Mg Tabs (Carisoprodol) .... Take one by mouth three times a day 11)  Combivent 103-18 Mcg/act Aero (Albuterol-ipratropium) .... 2 inh. q4-6h as needed 12)  Fluticasone Propionate 50 Mcg/act Susp (Fluticasone propionate) .... 2 sprays each nostril daily 13)  Ativan 0.5 Mg Tabs (Lorazepam) 14)  Zithromax Z-pak 250 Mg Tabs (Azithromycin) .... Use as directed  Other Orders: Depo- Medrol 80mg  (J1040) Rocephin  250mg  (Z6109) Admin of Therapeutic Inj  intramuscular or subcutaneous (60454)  Patient Instructions: 1)  Return tommorow for 1 gram of Rocephin IM in split dose 2)  the vial recieved today of depomedrol youwere given 32mg  odfdepomedrol I would take another.4mg  tomorrow and then from other vial of depomedrol would take .25ml for the nsxt 3 days 3)  will give a new  work note for tomorrow as well 4)  will start zithromax as well  today Prescriptions: ZITHROMAX Z-PAK 250 MG  TABS (AZITHROMYCIN) Use as directed  #1 x 0   Entered and Authorized by:   Hassan Rowan MD   Signed by:   Hassan Rowan MD on 12/12/2010   Method used:   Printed then faxed to ...       [facility] [phone]* (retail)       [address] [place], [place]  [address]       Ph: [phone] or [phone]       Fax: [phone]   RxID:   [id number] ZITHROMAX Z-PAK 250 MG  TABS (AZITHROMYCIN) Use as directed  #1 x 0   Entered and Authorized by:   Hassan Rowan MD   Signed by:   Hassan Rowan MD on 12/12/2010   Method used:   Printed then faxed to ...       [facility] [phone]* (retail)       [address] [place], [place]  [address]       Ph: [phone] or [phone]       Fax: [phone]   RxID:   [id number]    Medication Administration  Injection # 1:    Medication: Depo- Medrol 80mg     Diagnosis: BRONCHITIS, ACUTE WITH BRONCHOSPASM (ICD-466.0)    Route: IM    Site: RUOQ gluteus  Exp Date: 07/12    Lot #: Tommas Olp    Mfr: Pharmacia    Comments: .4mg  given    Patient tolerated injection without complications    Given by: Adella Hare LPN (December 12, 2010 3:55 PM)  Injection # 2:    Medication: Rocephin  250mg     Diagnosis: BRONCHITIS, ACUTE WITH BRONCHOSPASM (ICD-466.0)    Route: IM    Site: LUOQ gluteus    Exp Date: 03/14/2013    Lot #: [id number]    Mfr: hospira    Comments: 1 gram given 500mg  in each hip per patient request    Patient tolerated injection without complications    Given by: Adella Hare LPN (December 12, 2010 3:57 PM)  Orders Added: 1)  Est. Patient Level IV [04540] 2)  Depo- Medrol 80mg  [J1040] 3)  Rocephin  250mg  [J0696] 4)  Admin of Therapeutic Inj  intramuscular or subcutaneous [96372]     Medication Administration  Injection # 1:    Medication: Depo- Medrol 80mg     Diagnosis: BRONCHITIS, ACUTE WITH BRONCHOSPASM (ICD-466.0)    Route:  IM    Site: RUOQ gluteus    Exp Date: 07/12    Lot #: Tommas Olp    Mfr: Pharmacia    Comments: .4mg  given    Patient tolerated injection without complications    Given by: Adella Hare LPN (December 12, 2010 3:55 PM)  Injection # 2:    Medication: Rocephin  250mg     Diagnosis: BRONCHITIS, ACUTE WITH BRONCHOSPASM (ICD-466.0)    Route: IM    Site: LUOQ gluteus    Exp Date: 03/14/2013    Lot #: [id number]    Mfr: hospira    Comments: 1 gram given 500mg  in each hip per patient request    Patient tolerated injection without complications    Given by: Adella Hare LPN (December 12, 2010 3:57 PM)  Orders Added: 1)  Est. Patient Level IV [47829] 2)  Depo- Medrol 80mg  [J1040] 3)  Rocephin  250mg  [J0696] 4)  Admin of Therapeutic Inj  intramuscular or subcutaneous [56213]

## 2010-12-22 NOTE — Assessment & Plan Note (Signed)
Summary: Medication Injection  Nurse Visit   Allergies: 1)  ! Erythromycin 2)  ! Levaquin 3)  Septra 4)  Augmentin (Amoxicillin-Pot Clavulanate) 5)  Biaxin (Clarithromycin) 6)  Cipro (Ciprofloxacin)  Medication Administration  Injection # 1:    Medication: Rocephin  1gm    Diagnosis: ? of UPPER RESPIRATORY INFECTION (ICD-465.9)    Route: IM    Site: LUOQ gluteus    Exp Date: 03/27/2013    Lot #: [id number]    Patient tolerated injection without complications    Given by: Levonne Spiller EMT-P (December 13, 2010 4:52 PM)  Appended Document: Medication Injection Pt returned for final rocephin injections as arranged by Dr. Thurmond Butts over the weekend. Rodney Langton, MD, CDE, [employer]

## 2011-01-19 ENCOUNTER — Ambulatory Visit: Payer: Self-pay

## 2011-01-20 ENCOUNTER — Ambulatory Visit: Payer: Self-pay | Admitting: Internal Medicine

## 2011-01-26 ENCOUNTER — Ambulatory Visit: Payer: Self-pay | Admitting: Unknown Physician Specialty

## 2011-02-04 ENCOUNTER — Other Ambulatory Visit: Payer: Self-pay | Admitting: Unknown Physician Specialty

## 2011-02-04 DIAGNOSIS — Z9889 Other specified postprocedural states: Secondary | ICD-10-CM

## 2011-02-04 DIAGNOSIS — N63 Unspecified lump in unspecified breast: Secondary | ICD-10-CM

## 2011-02-08 ENCOUNTER — Ambulatory Visit: Payer: Self-pay | Admitting: Internal Medicine

## 2011-02-12 ENCOUNTER — Other Ambulatory Visit: Payer: Self-pay

## 2011-02-13 ENCOUNTER — Ambulatory Visit: Payer: Self-pay | Admitting: Internal Medicine

## 2011-02-16 ENCOUNTER — Other Ambulatory Visit: Payer: Self-pay

## 2011-02-21 IMAGING — CR DG CHEST 2V
1 series · 2 of 2 positions shown · non-contrast
Comparison: none

REASON FOR EXAM: cough, fever
COMMENTS:

PROCEDURE:     MDR - MDR CHEST PA(OR AP) AND LATERAL  - January 31, 2009  [DATE]
RESULT:     Comparison: None

[Series 1: view not recorded · 0.17mm/px · 2 of 2 slices shown]
[im 1/2]
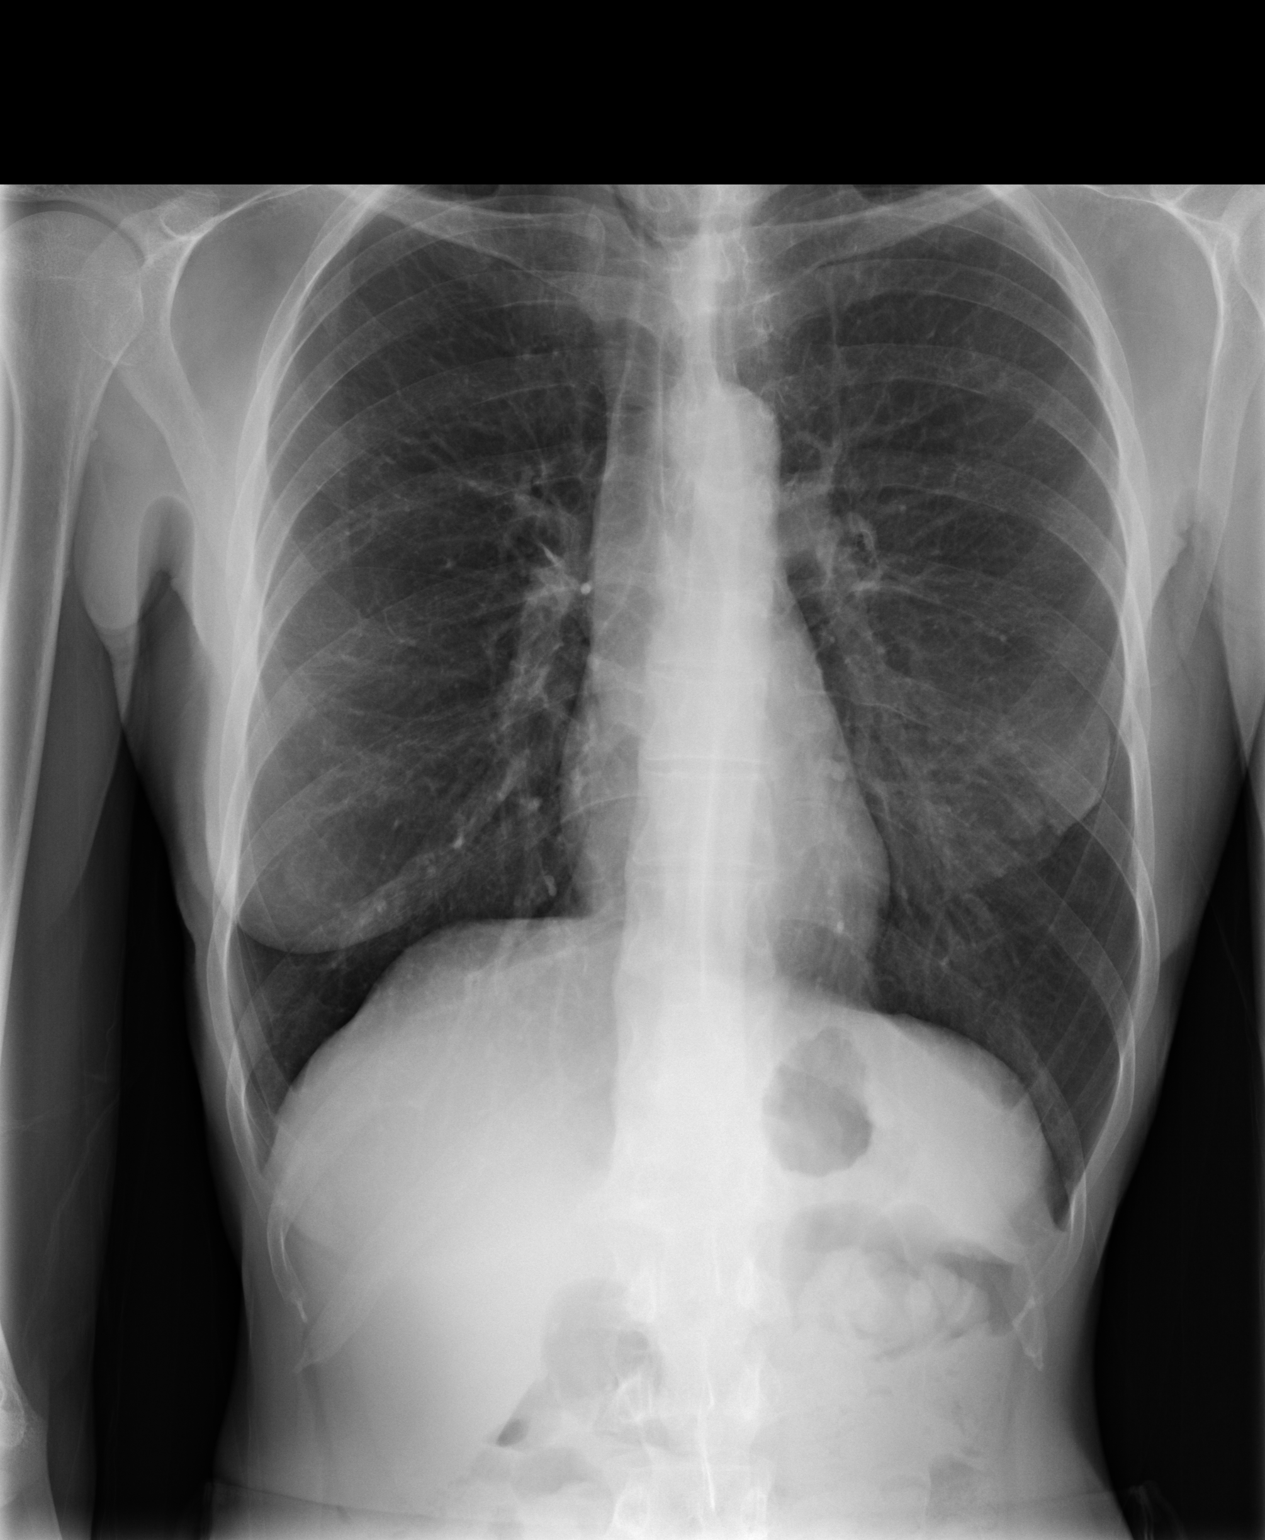
[im 2/2]
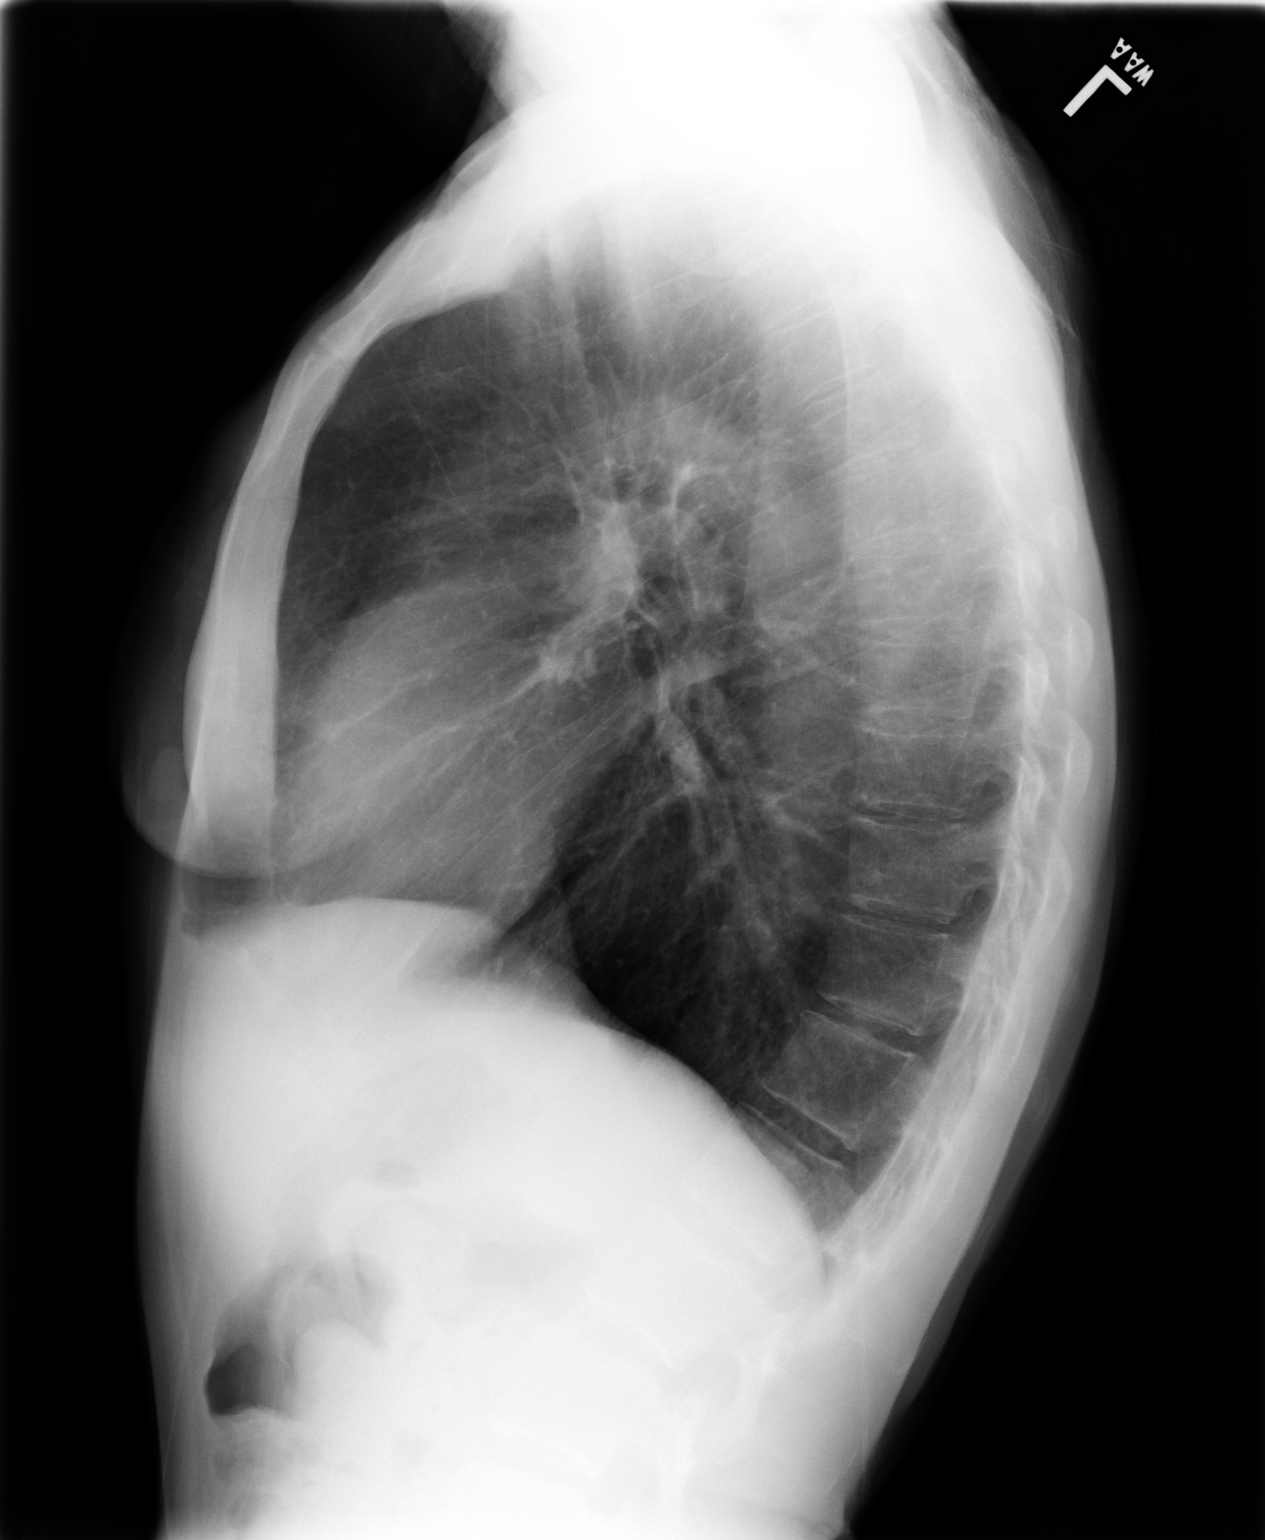

[2 of 2 positions shown; findings below may reference images not displayed]

FINDINGS: PA and lateral chest radiographs are provided. There is no focal parenchymal
opacity, pleural effusion, or pneumothorax. The lungs are hyperinflated with
relative lucency of the upper lung fields most consistent with COPD. The
heart and mediastinum are unremarkable. The osseous structures are
unremarkable.
IMPRESSION: No acute disease of the chest.

## 2011-03-04 ENCOUNTER — Ambulatory Visit (HOSPITAL_COMMUNITY)
Admission: RE | Admit: 2011-03-04 | Discharge: 2011-03-04 | Disposition: A | Discharge status: Home / Self Care | Payer: 59 | Source: Ambulatory Visit | Attending: Psychiatry | Admitting: Psychiatry

## 2011-03-04 DIAGNOSIS — Z1389 Encounter for screening for other disorder: Secondary | ICD-10-CM | POA: Insufficient documentation

## 2011-03-04 DIAGNOSIS — R51 Headache: Secondary | ICD-10-CM | POA: Insufficient documentation

## 2011-03-04 DIAGNOSIS — R259 Unspecified abnormal involuntary movements: Secondary | ICD-10-CM | POA: Insufficient documentation

## 2011-03-05 ENCOUNTER — Other Ambulatory Visit: Payer: 59

## 2011-03-05 NOTE — Procedures (Unsigned)
REFERRING PHYSICIAN:  Dr. Annia Belt.  HISTORY:  This patient is a 50 year old woman complaining of headaches and shaking of limbs with ache at some time.  EEG has been performed as sleep deprived study.  His EEG duration is 30 minutes of sleep deprived EEG recording.  MEDICATIONS:  Albuterol, prednisone, and Ultram.  ELECTROENCEPHALOGRAPH DESCRIPTION:  This is a routine 18 channel adult EEG recording with one channel devoted to limited EKG recording. Activation procedure was performed during the photic stimulation and hyperventilation and the study was performed in awake state.  As the EEG opens up, I noticed that the posterior dominant rhythm is well formed with preserved anterior-posterior gradient and is in 9 Hz frequently ranges symmetrically with an amplitude of 12-25 microvolts. No generalized build-up of high amplitude slowing was noted with the hyperventilation or posthyperventilation phase, probably because of lack of efforts.  No driving was noted with the photic stimulation in posterior leads.  There were no electrographic seizures or epileptiform discharges or any asymmetry recorded during this study.  Only consistent finding throughout the study is blinking artifact recorded from the frontal leads.  There were no sleep architecture recorded during this study either.  ELECTROENCEPHALOGRAPH INTERPRETATION:  This is a normal awake EEG. There is no evidence to suggest seizures or epileptiform discharges based on the study.          ______________________________ Levie Heritage, MD    XT:GGYI D:  03/04/2011 14:03:59  T:  03/05/2011 03:16:15  Job #:  [id number]

## 2011-03-29 NOTE — Assessment & Plan Note (Signed)
[name] [name]                         GASTROENTEROLOGY OFFICE NOTE   NAME:[name], [name] PRIVITERA                     MRN:          [id number]  DATE:07/20/2007                            DOB:          1961/03/10    [name] [name] is a 50 year old white female with inflammatory bowel  disease diagnosed in 1993 on a colonoscopy which showed diffuse pan  colitis.  She was hospitalized and treated with steroids and anti-  inflammatory agents.  Since 1999, she has not followed up for different  reasons.  She lives in [place] and has continued on chronic  prednisone, initially 7.5 mg a day, subsequently decreasing the dose to  5 mg a day.  She is here today because of not feeling well, being  fatigued, and trying to cut back on her prednisone and not being able  to.  She was found to be anemic with severe iron-deficiency anemia  several months ago and required iron infusion, but she was intolerant to  the infusion and was nauseated and vomited for about a week.  She has  chronic constipation, occasional right lower quadrant abdominal pain and  weight loss.  Her appetite has been low, but she has been able to work  full time.   MEDICATIONS:  Multivitamins, Nasacort, Soma 350 mg 1/2 tablet nightly,  ferrous sulfate 325 mg t.i.d., fish oil, herbal anti-inflammatory  agents, Famvir suspension p.r.n., Ultram 10 mg p.r.n.  She was on Boniva  150 mg a month but has run out of it.  She is currently on prednisone 5  mg a day.   PHYSICAL EXAMINATION:  Blood pressure 104/70, pulse 64, weight 100  pounds.  She is very thin.  Alert and oriented.  Sclerae are anicteric.  NECK:  Supple with no adenopathy.  LUNGS:  Clear to auscultation.  Her rib cage showed some intercostal  wasting.  No wheezes or rales.  COR:  Rapid with an S1 and S2.  ABDOMEN:  Soft and scaphoid with minimal tenderness in the right lower  quadrant.  No distention.  Normoactive bowel sounds.  The liver  edge is  at the costal margin.  RECTAL:  Not done because patient was on her period today.  EXTREMITIES:  No edema.   LABORATORY DATA:  From 2005 showed low testosterone levels of less than  10.  DHEA less than 15.  She had serum cortisol level of 2.3, normal  being 3.1 through 22.   IMPRESSION:  1. A 50 year old white female with inflammatory bowel disease who has      been loss to follow up for several years.  We have not really      evaluated her Crohn's disease for the past several years.  The last      time she had a CT scan of the abdomen it was negative.  2. 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  Patient has remained on      prednisone, currently 5 mg a day.  3. 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  4. Iron-deficiency anemia:  Patient is status  post iron infusion with      developing intolerance to the infusion.  5. Polyarthralgia:  Patient requests rheumatology consultation.  6. Osteopenia, by history:  This could be secondary to steroids.  She      was on Boniva but discontinued it.   PLAN:  1. Patient will need to restage her Crohn's disease.  She would prefer      to go with a CT scan before doing colonoscopy because of poor      tolerance for laxatives.  We will proceed with CT scan of the      abdomen and pelvis.  2. Refer to rheumatology, Dr. Corliss [name].  3. CBC, iron studies, sed rate, B12, and CMET today.  4. I have discussed different therapeutic options with the patient,      such as Humira, mesalamine, depending on the restaging of her      Crohn's disease, I would increase the prednisone to 6 mg a day.  I      would also consider using budesonide instead of the prednisone, but      I am afraid to switch it because of adrenal insufficiency, as this      is quite significant and she could develop Addisonian crisis if we      change the prednisone dose considerably.   We will make further decisions depending on the  CT scan of the abdomen  and pelvis.     [name] Morton. Juanda Chance, MD  Electronically Signed    DMB/MedQ  DD: 07/20/2007  DT: 07/20/2007  Job #: [id number]   cc:   [name] [name], M.D.

## 2011-04-01 NOTE — Letter (Signed)
September 03, 2007    Alan Mulder, M.D.  [address]  [place], [place] [address]   RE:  [name] [name], [name] [name]  MRN:  [id number]  /  DOB:  08/22/1961   Dear Dr. Patrecia Pace:   Thank you very much for your progress note on [name] from  August 24, 2007.  Our plan with Chalsey will be to restage her Crohn's  disease.  This already has been partially accomplished by doing a CT  scan of the abdomen and pelvis.  Colonoscopy will be done in the next  several weeks.  Providing that she shows no evidence of active Crohn's  disease, I would like to go ahead and start tapering her prednisone.  As  you known, she was diagnosed with adrenal insufficiency in the past and  I am afraid actually to taper it myself.  I would appreciate Your help  with her Prednisone taper.  She is currently completing her 24-hour  urine collection for cortisol.  I would appreciate it if you could send  me your office notes from her future appointments and your opinion as to  how fast we can taper her steroids.  I am not even sure  if it will be  possible at this point.  I will also keep you posted as far as her  Crohn's disease is concerned.  [name] is a very  introvert person and  does not communicate very well.  It has been rather difficult to do the  right things for her in the past.  She sometimes avoids tests and does  not give exact information about herself, so I will really rely on your  reports rather than her reports.   Thank you very much for letting me share in her care.    Sincerely,      Hedwig Morton. Juanda Chance, MD  Electronically Signed    DMB/MedQ  DD: 09/03/2007  DT: 09/03/2007  Job #: [id number]

## 2011-04-01 NOTE — Letter (Signed)
November 19, 2007    Beverely Risen  [address]  [place], [place] [address]   RE:  CAMDEN, MAZZAFERRO  MRN:  [id number]  /  DOB:  1961/05/06   Dear Clydie Braun:   I had an opportunity of talking with Dr. Everardo All concerning your  prednisone taper.  He is ready to assist me and you in getting you  slowly off of the prednisone.  We will go ahead and make an appointment  for you with him while you are also making arrangements for me to do a  colonoscopy.  Please keep his appointment so we can get this  accomplished in a most efficient period of time.    Sincerely,      Hedwig Morton. Juanda Chance, MD  Electronically Signed    DMB/MedQ  DD: 11/19/2007  DT: 11/19/2007  Job #: [id number]   CC:    Gregary Signs A. Everardo All, MD

## 2011-04-01 NOTE — Consult Note (Signed)
[facility]                          ENDOCRINOLOGY CONSULTATION   NAME:[name] [name], [name] [name]                     MRN:          [id number]  DATE:01/12/2007                            DOB:          [birth year]    REASON FOR REFERRAL:  Adrenal situation.   HISTORY OF PRESENT ILLNESS:  A 50 year old woman who states that she was  diagnosed with Crohn disease in 1993 and was treated with prednisone.  I  saw her in 2000 at a time when she was on prednisone 10 mg a day.  Since  then, her prednisone has been successfully reduced to 5 mg a day, where  it has been for the past 2 years.  Dr. Alphonsus Sias raises the question of  adrenal sufficiency in the setting of a hoped-for further reduction in  the prednisone by Dr. Juanda Chance.  She states that in the context of  increasing her prednisone, she gets sores on her face.  She also has  some fatigue with associated dizziness, which has been present for many  years.   PAST MEDICAL HISTORY:  1. Osteoarthritis.  2. No history of adrenal disease.  3. Osteoporosis.  4. Iron-deficiency anemia.   SOCIAL HISTORY:  She is single.  She works at [employer].  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  She states that she sleeps from 3 in the  morning until 9 in the morning.   REVIEW OF SYSTEMS:  She claims that she has intermittent fever.  She  also has some symptoms of diarrhea and numbness.  She denies syncope and  any change in her weight.   PHYSICAL EXAMINATION:  VITAL SIGNS:  Blood pressure is 99/56, heart rate  67, temperature is 97.3, her weight is 103.  GENERAL:  Lean, middle-aged woman in no distress.  SKIN:  No rash, not diaphoretic.  HEENT:  No proptosis.  No periorbital swelling.  Pharynx is normal to my  examination.  NECK:  Supple, no goiter.  CHEST:  Clear to auscultation.  No respiratory distress.  CARDIOVASCULAR:  No JVD, no edema.  Regular rate and rhythm.  Tachycardic regular rhythm,  no murmur.  Pedal pulses are  intact.  NEUROLOGIC:  Alert and oriented.  She appears slightly depressed.  Sensation is intact to touch on the feet at the time of my examination.   IMPRESSION:  Crohn disease.   PLAN:  1. Further reduction in prednisone if her disease will tolerate.  2. Given the fact that she has needed and therefore taken prednisone      all these years, it should be assumed that she has chronic HPA      insufficiency.  3. History of osteoporosis.  4. Slight numbness of her feet.  5. Iron-deficiency anemia, probably associated with her Crohn's.   PLAN:  1. Please follow up with Dr. Juanda Chance as scheduled to see if a reduction      in the prednisone is possible given the state of your Crohn      disease.  I will send a copy of today's note to Dr. Juanda Chance, asking  her to reduce it to 4 mg daily if she agrees, and then I would like      to see her back 30 days after that.  2. Check parathyroid levels.  3. Aggressive treatment for her iron-deficiency anemia will help to      minimize any symptoms she might encounter in reducing her      prednisone.     Sean A. Everardo All, MD  Electronically Signed    SAE/MedQ  DD: 01/14/2007  DT: 01/15/2007  Job #: [id number]   cc:   Hedwig Morton. Juanda Chance, MD  Karie Schwalbe, MD

## 2011-07-20 NOTE — Progress Notes (Signed)
This encounter was created in error - please disregard.

## 2012-01-29 IMAGING — CR DG CHEST 2V
1 series · 2 of 2 positions shown · non-contrast
Comparison: none

REASON FOR EXAM: shortness of breath
COMMENTS:

[Series 1: view not recorded · 0.17mm/px · 2 of 2 slices shown]
[im 1/2]
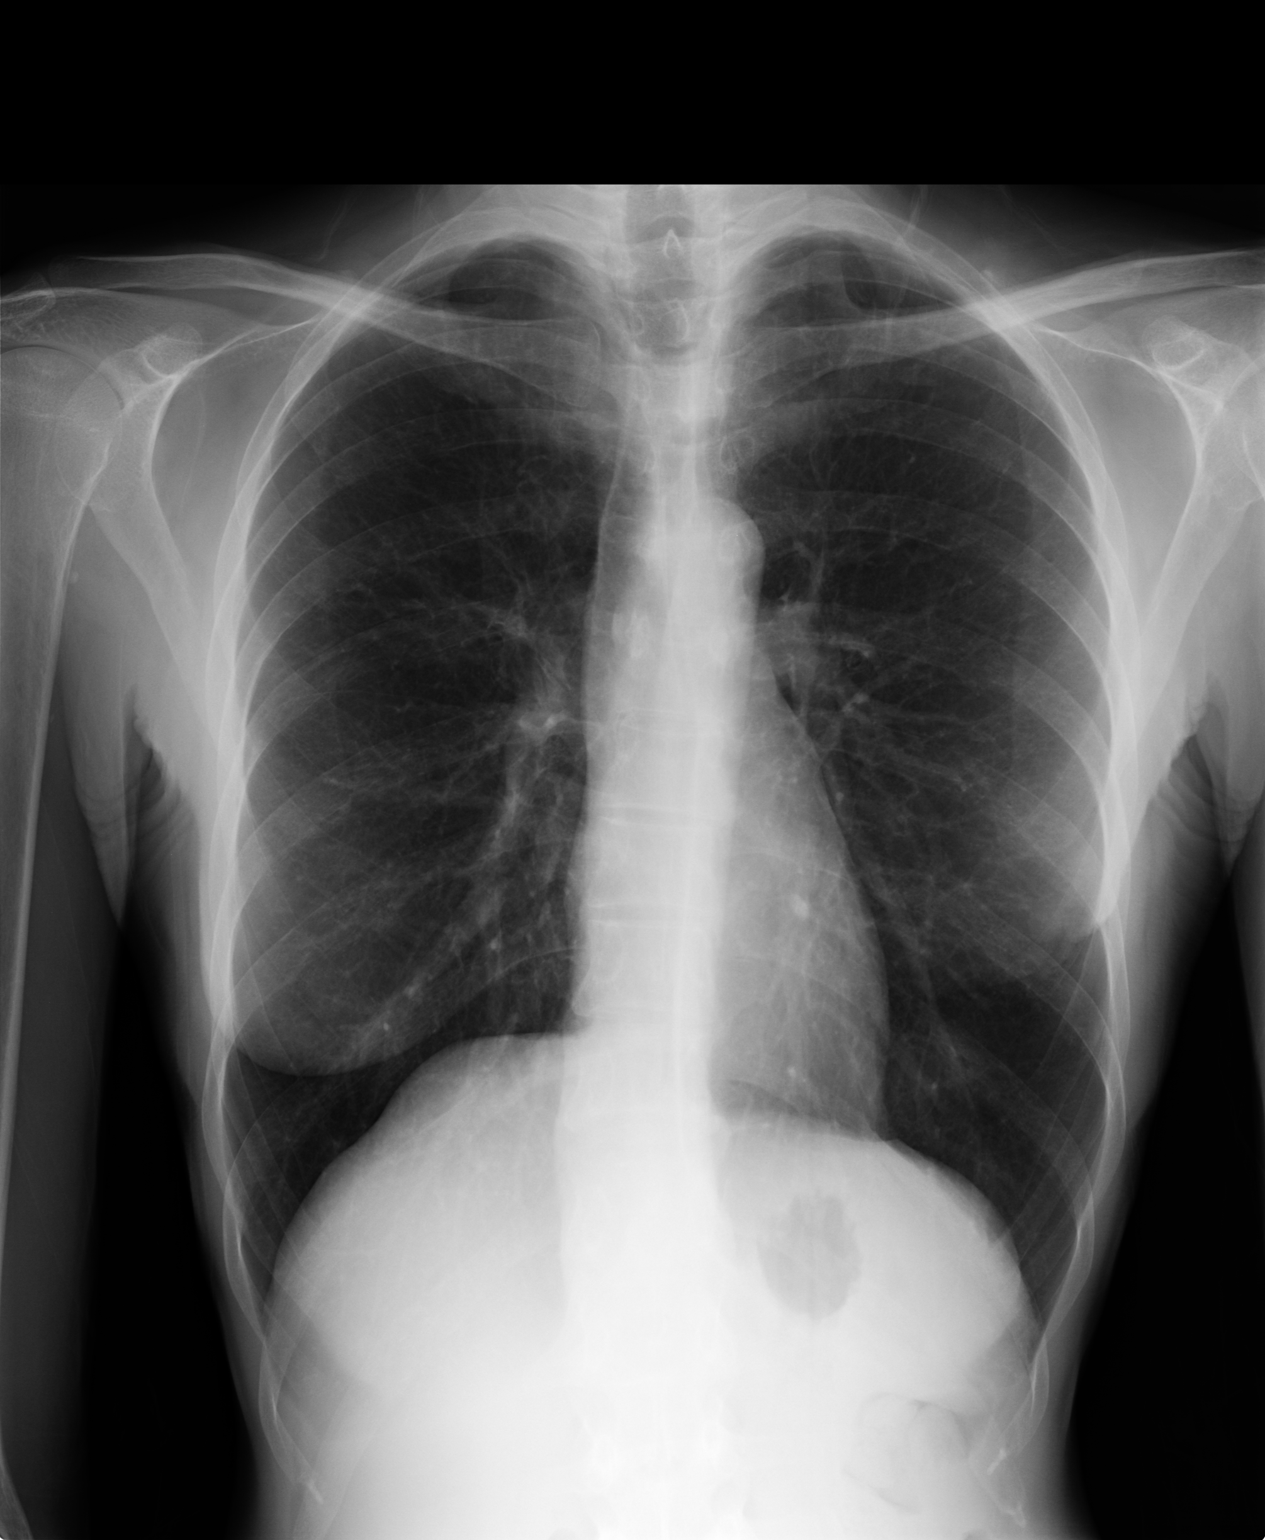
[im 2/2]
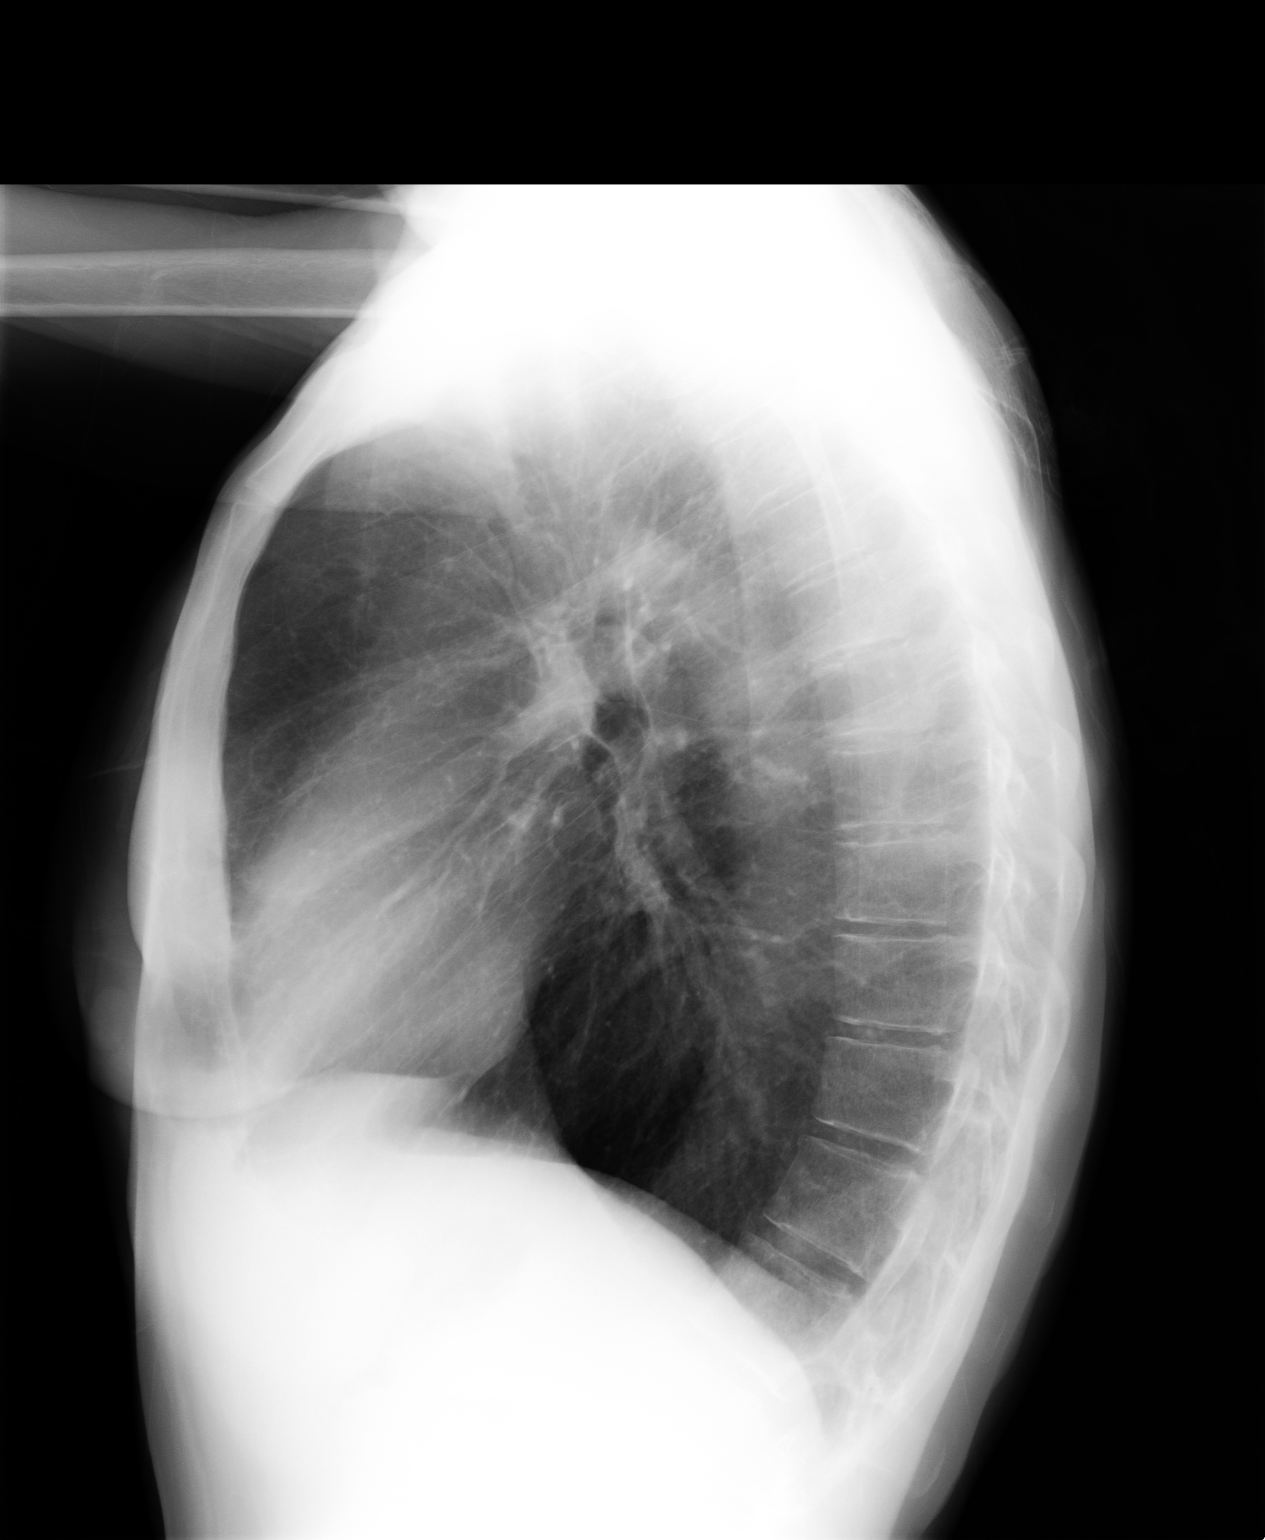

[2 of 2 positions shown; findings below may reference images not displayed]

PROCEDURE:     KDR - KDXR CHEST PA (OR AP) AND LAT  - January 08, 2010  [DATE]

RESULT:     Comparison is made a prior exam of 01/05/2010. The lung fields
remain clear of infiltrate. No pleural effusion or pneumothorax is seen.
Heart size is normal. There is again observed marked hyperexpansion of the
lungs consistent with asthma or COPD.
IMPRESSION: 1. The lung fields remain clear of infiltrate.
2. Again observed is hyperexpansion of the lungs bilaterally.
3. Heart size is normal.

## 2012-07-17 ENCOUNTER — Encounter: Payer: Self-pay | Admitting: Internal Medicine

## 2013-01-24 ENCOUNTER — Emergency Department: Payer: Self-pay | Admitting: Internal Medicine

## 2013-01-24 LAB — URINALYSIS, COMPLETE
Bacteria: NONE SEEN
Glucose,UR: NEGATIVE mg/dL (ref 0–75)
Ph: 6 (ref 4.5–8.0)
Protein: NEGATIVE

## 2013-01-24 LAB — COMPREHENSIVE METABOLIC PANEL
Albumin: 4.3 g/dL (ref 3.4–5.0)
Alkaline Phosphatase: 81 U/L (ref 50–136)
Anion Gap: 12 (ref 7–16)
BUN: 6 mg/dL — ABNORMAL LOW (ref 7–18)
EGFR (Non-African Amer.): >60
Glucose: 77 mg/dL (ref 65–99)
SGPT (ALT): 44 U/L (ref 12–78)

## 2013-01-24 LAB — CBC
MCH: 31.4 pg (ref 26.0–34.0)
MCHC: 33.5 g/dL (ref 32.0–36.0)
MCV: 94 fL (ref 80–100)

## 2013-01-24 LAB — CK TOTAL AND CKMB (NOT AT ARMC): CK-MB: 7.4 ng/mL — ABNORMAL HIGH (ref 0.5–3.6)

## 2013-01-30 ENCOUNTER — Inpatient Hospital Stay: Payer: Self-pay | Admitting: Psychiatry

## 2013-01-30 LAB — ETHANOL: Ethanol: <3 mg/dL

## 2013-01-30 LAB — URINALYSIS, COMPLETE
Bilirubin,UR: NEGATIVE
Blood: NEGATIVE
Glucose,UR: NEGATIVE mg/dL (ref 0–75)
Ketone: NEGATIVE
Squamous Epithelial: 1
WBC UR: 2 /HPF (ref 0–5)

## 2013-01-30 LAB — CBC
HCT: 43.6 % (ref 35.0–47.0)
MCH: 31.6 pg (ref 26.0–34.0)
MCHC: 33.9 g/dL (ref 32.0–36.0)
MCV: 93 fL (ref 80–100)
RBC: 4.67 10*6/uL (ref 3.80–5.20)
RDW: 14.7 % — ABNORMAL HIGH (ref 11.5–14.5)

## 2013-01-30 LAB — COMPREHENSIVE METABOLIC PANEL
Albumin: 4.3 g/dL (ref 3.4–5.0)
Alkaline Phosphatase: 73 U/L (ref 50–136)
Calcium, Total: 9.7 mg/dL (ref 8.5–10.1)
Co2: 30 mmol/L (ref 21–32)
EGFR (Non-African Amer.): >60
Glucose: 92 mg/dL (ref 65–99)
SGOT(AST): 28 U/L (ref 15–37)

## 2013-01-30 LAB — DRUG SCREEN, URINE
Barbiturates, Ur Screen: NEGATIVE (ref ?–200)
Methadone, Ur Screen: NEGATIVE (ref ?–300)
Tricyclic, Ur Screen: NEGATIVE (ref ?–1000)

## 2013-01-30 LAB — CK: CK, Total: 127 U/L (ref 21–215)

## 2014-06-12 ENCOUNTER — Telehealth: Payer: Self-pay | Admitting: Internal Medicine

## 2014-06-12 ENCOUNTER — Ambulatory Visit: Payer: 59 | Admitting: Endocrinology

## 2014-09-04 ENCOUNTER — Ambulatory Visit: Payer: 59 | Admitting: Endocrinology

## 2014-09-04 DIAGNOSIS — Z0289 Encounter for other administrative examinations: Secondary | ICD-10-CM

## 2015-01-15 ENCOUNTER — Inpatient Hospital Stay: Payer: Self-pay | Admitting: Psychiatry

## 2015-01-20 ENCOUNTER — Inpatient Hospital Stay: Payer: Self-pay | Admitting: Internal Medicine

## 2015-01-22 ENCOUNTER — Inpatient Hospital Stay: Payer: Self-pay | Admitting: Psychiatry

## 2015-01-24 ENCOUNTER — Observation Stay: Payer: Self-pay | Admitting: Internal Medicine

## 2015-01-26 ENCOUNTER — Inpatient Hospital Stay
Admit: 2015-01-26 | Disposition: A | Discharge status: Home / Self Care | Payer: Self-pay | Attending: Psychiatry | Admitting: Psychiatry

## 2015-01-30 DIAGNOSIS — I499 Cardiac arrhythmia, unspecified: Secondary | ICD-10-CM

## 2015-02-05 LAB — BASIC METABOLIC PANEL
ANION GAP: 3 — AB (ref 7–16)
BUN: 13 mg/dL
CALCIUM: 8.8 mg/dL — AB
Chloride: 110 mmol/L
Co2: 27 mmol/L
Creatinine: 0.41 mg/dL — ABNORMAL LOW
Glucose: 94 mg/dL
Potassium: 4.4 mmol/L
SODIUM: 140 mmol/L

## 2015-02-05 LAB — MAGNESIUM: MAGNESIUM: 2.1 mg/dL

## 2015-02-05 LAB — PHOSPHORUS: PHOSPHORUS: 2.9 mg/dL

## 2015-02-13 LAB — BASIC METABOLIC PANEL
ANION GAP: 5 — AB (ref 7–16)
BUN: 14 mg/dL
CALCIUM: 9.3 mg/dL
CO2: 27 mmol/L
Chloride: 110 mmol/L
Creatinine: 0.42 mg/dL — ABNORMAL LOW
EGFR (Non-African Amer.): >60
GLUCOSE: 95 mg/dL
POTASSIUM: 3.8 mmol/L
SODIUM: 142 mmol/L

## 2015-02-17 LAB — BASIC METABOLIC PANEL
ANION GAP: 7 (ref 7–16)
BUN: 16 mg/dL
CHLORIDE: 107 mmol/L
Calcium, Total: 9.2 mg/dL
Co2: 26 mmol/L
Creatinine: 0.42 mg/dL — ABNORMAL LOW
Glucose: 94 mg/dL
POTASSIUM: 3.8 mmol/L
Sodium: 140 mmol/L

## 2015-02-17 LAB — MAGNESIUM: MAGNESIUM: 1.9 mg/dL

## 2015-02-17 LAB — PHOSPHORUS: Phosphorus: 3.3 mg/dL
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
## 2015-03-06 NOTE — Consult Note (Signed)
Brief Consult Note: Diagnosis: psychosis nos ro mde.   Patient was seen by consultant.   Consult note dictated.   Recommend further assessment or treatment.   Orders entered.   Discussed with Attending MD.   Comments: Psychiatry: Patient seen. Worsening psychosis and depression. Admit. See H&P.  Electronic Signatures: Audery Amel (MD)  (Signed 19-Mar-14 19:38)  Authored: Brief Consult Note   Last Updated: 19-Mar-14 19:38 by Audery Amel (MD)

## 2015-03-06 NOTE — Discharge Summary (Signed)
PATIENT NAME:  [name] [name], JANOWICZ MR#:  [id number] DATE OF BIRTH:  16-May-1961  DATE OF ADMISSION:  01/30/2013 DATE OF DISCHARGE:  02/19/2013  HOSPITAL COURSE: See dictated history and physical for details of admission. This 54 year old woman who was admitted to the hospital because of psychotic symptoms with paranoia and hallucinations as well as depression was treated for psychotic symptoms as well as multiple symptoms of major depression. The patient was initially quite resistant to medication treatment. She was accepting very low dose of Risperdal but would complain of side effects when the dose was increased even slightly. I attempted to have her take an antidepressant in the form of Celexa and she complained frequently of side effects including drowsiness and dizziness, and refused to take it. During the first part of her hospital stay, she remained largely withdrawn and rarely interacted with other patients. She tended to minimize her symptoms, often stating that she was feeling fine. She denied pretty quickly that she was having any psychotic symptoms. Despite this, she still was not eating well and not sleeping well and showed some paranoia in her interactions. Her family visited and her brother informed the team that they were very concerned about her behavior at home because she rarely got out of bed and they were afraid she was going to starve herself and that care for her was becoming a burden on their elderly father. The patient did not see it this way, thinking that she herself was caring for the father. Eventually, I made a medication change with her and started her on Seroquel at night as an attempt to treat both the depression and the psychotic symptoms as well as to help her with her sleep. To my pleasure and surprise, she tolerated this medicine much better. I took her off the Risperdal and the Celexa. She was able to take Seroquel alone at night and gradually we managed to increase the dose up to  200 mg at night. At that dose, the patient, whether it is cause and effect or not, became much more interactive, getting out of her room, going to groups more, became more communicative and seemed to be eating a little bit better. She was not showing the same acute paranoia she had before and her affect looked slightly brighter. At no time did she engage in any suicidal behavior or endorse any suicidal ideation in the hospital. The patient was discharged with plans that she would follow up with [facility].   DISCHARGE MEDICATIONS: Seroquel 200 mg p.o. at bedtime, famciclovir 250 mg daily p.r.n. for herpes outbreaks, docusate 100 mg twice a day, Nasacort AQ 2 sprays both nostrils b.i.d.   LABORATORY RESULTS: Admission labs showed a chemistry panel with low creatinine at 0.56, low potassium 2.7. Drug screen was negative. Folic acid is normal at 27. CK normal at 127. Alcohol undetectable. Followup chemistry panel showed a stabilization and improvement of her electrolytes. Admission urinalysis was nonspecific. Vitamin B12 level was 643, which is normal. A 24-hour cortisol test done at her request because of a supposed history of adrenal insufficiency was normal at 17.7. The CBC showed a low white count at 3.1, otherwise normal.   MENTAL STATUS EXAM AT DISCHARGE: Neatly-dressed and groomed woman, cooperative with the interview. Eye contact normal. Psychomotor activity normal. Speech normal rate, tone and volume. Affect still a little bit constricted but not bizarre. Mood stated as good. Thoughts appeared to be lucid with no loosening of associations or delusions. Denied auditory or visual hallucinations. Denied suicidal  or homicidal ideation. Showed improved judgment and insight. Intact short and long-term memory. Normal intelligence.   DIAGNOSIS, PRINCIPAL AND PRIMARY:  AXIS I: Major depressive episode, single, severe, with psychotic features.   SECONDARY DIAGNOSES: AXIS I: No further.  AXIS II: Deferred.  AXIS  III: History of herpes outbreaks, low weight from low p.o. intake, nasal congestion.  AXIS IV: Moderate to severe from lack of much support at home.  AXIS V: Functioning at time of discharge is 55.   ____________________________ Audery Amel, MD jtc:cs D: 02/24/2013 11:41:00 ET T: 02/24/2013 14:27:35 ET JOB#: [id number]  cc: Audery Amel, MD, <Dictator> Audery Amel MD ELECTRONICALLY SIGNED 02/28/2013 11:00

## 2015-03-06 NOTE — Discharge Summary (Signed)
PATIENT NAME:  [name] [name], JANOWICZ MR#:  [id number] DATE OF BIRTH:  16-May-1961  DATE OF ADMISSION:  01/30/2013 DATE OF DISCHARGE:  02/19/2013  HOSPITAL COURSE: See dictated history and physical for details of admission. This 54 year old woman who was admitted to the hospital because of psychotic symptoms with paranoia and hallucinations as well as depression was treated for psychotic symptoms as well as multiple symptoms of major depression. The patient was initially quite resistant to medication treatment. She was accepting very low dose of Risperdal but would complain of side effects when the dose was increased even slightly. I attempted to have her take an antidepressant in the form of Celexa and she complained frequently of side effects including drowsiness and dizziness, and refused to take it. During the first part of her hospital stay, she remained largely withdrawn and rarely interacted with other patients. She tended to minimize her symptoms, often stating that she was feeling fine. She denied pretty quickly that she was having any psychotic symptoms. Despite this, she still was not eating well and not sleeping well and showed some paranoia in her interactions. Her family visited and her brother informed the team that they were very concerned about her behavior at home because she rarely got out of bed and they were afraid she was going to starve herself and that care for her was becoming a burden on their elderly father. The patient did not see it this way, thinking that she herself was caring for the father. Eventually, I made a medication change with her and started her on Seroquel at night as an attempt to treat both the depression and the psychotic symptoms as well as to help her with her sleep. To my pleasure and surprise, she tolerated this medicine much better. I took her off the Risperdal and the Celexa. She was able to take Seroquel alone at night and gradually we managed to increase the dose up to  200 mg at night. At that dose, the patient, whether it is cause and effect or not, became much more interactive, getting out of her room, going to groups more, became more communicative and seemed to be eating a little bit better. She was not showing the same acute paranoia she had before and her affect looked slightly brighter. At no time did she engage in any suicidal behavior or endorse any suicidal ideation in the hospital. The patient was discharged with plans that she would follow up with [facility].   DISCHARGE MEDICATIONS: Seroquel 200 mg p.o. at bedtime, famciclovir 250 mg daily p.r.n. for herpes outbreaks, docusate 100 mg twice a day, Nasacort AQ 2 sprays both nostrils b.i.d.   LABORATORY RESULTS: Admission labs showed a chemistry panel with low creatinine at 0.56, low potassium 2.7. Drug screen was negative. Folic acid is normal at 27. CK normal at 127. Alcohol undetectable. Followup chemistry panel showed a stabilization and improvement of her electrolytes. Admission urinalysis was nonspecific. Vitamin B12 level was 643, which is normal. A 24-hour cortisol test done at her request because of a supposed history of adrenal insufficiency was normal at 17.7. The CBC showed a low white count at 3.1, otherwise normal.   MENTAL STATUS EXAM AT DISCHARGE: Neatly-dressed and groomed woman, cooperative with the interview. Eye contact normal. Psychomotor activity normal. Speech normal rate, tone and volume. Affect still a little bit constricted but not bizarre. Mood stated as good. Thoughts appeared to be lucid with no loosening of associations or delusions. Denied auditory or visual hallucinations. Denied suicidal  or homicidal ideation. Showed improved judgment and insight. Intact short and long-term memory. Normal intelligence.   DIAGNOSIS, PRINCIPAL AND PRIMARY:  AXIS I: Major depressive episode, single, severe, with psychotic features.   SECONDARY DIAGNOSES: AXIS I: No further.  AXIS II: Deferred.  AXIS  III: History of herpes outbreaks, low weight from low p.o. intake, nasal congestion.  AXIS IV: Moderate to severe from lack of much support at home.  AXIS V: Functioning at time of discharge is 55.   ____________________________ Audery Amel, MD jtc:cs D: 02/24/2013 11:41:36 ET T: 02/24/2013 14:27:35 ET JOB#: [id number]  cc: Audery Amel, MD, <Dictator>

## 2015-03-13 ENCOUNTER — Encounter: Payer: Self-pay | Admitting: Family Medicine

## 2015-03-15 NOTE — Discharge Summary (Signed)
PATIENT NAME:  [name] [name], [name] [name] MR#:  [id number] DATE OF BIRTH:  1961-05-03  DATE OF ADMISSION:  01/20/2015 DATE OF DISCHARGE:  01/22/2015  ADMITTING DIAGNOSIS: Hyponatremia, hypokalemia.   DISCHARGE DIAGNOSES:   1.  Hyponatremia, hypokalemia due to poor p.o. intake as a result of severe depression, now  improvedreplacement.  2.  History of adrenal insufficiency with normal cortisol noted during this hospitalization.  3.   Cachexia, malnutrition, severe, likely related to depression. Needs aggressive psychiatric treatment evaluation including CT chest, abdomen, pelvis done recently showed no significant abnormality.  4.  History of Crohn's disease with no acute symptom exacerbation.  5.  Depression with suicidal ideation.  Needs psychiatric care per psychiatry.   PERTINENT LABORATORY EVALUATIONS: Admitting glucose 115, BUN 9, creatinine 0.51, sodium 128, potassium 3.2, chloride 93, CO2 of 25, calcium 9.8. TSH was slightly abnormal at 4.86. Urine culture no growth.  B12 level  was normal  Cortisol random was 21.6.    HOSPITAL COURSE: Please refer to H and P done by the admitting physician. The patient is a 54 year old white female who is admitted to Behavioral Medicine and was transferred to inpatient for hyponatremia and hypokalemia. The patient  refused to eat and drink.   As a result,   her electrolyte imbalances occurred. The patient was given IV fluids and replacements with improvement in her electrolytes. Unfortunately,  if the patient's depression does not improve, she will continue to have these electrolyte imbalances. The patient will be transferred back to Behavioral Medicine for further evaluation and treatment. At this time, she is stable.   DISCHARGE MEDICATIONS: Colace 100 one tab p.o. b.i.d., potassium chloride 20 mEq daily, multivitamin 1 tab p.o. daily, Ensure Plus 1 can t.i.d. with meals, thiamine 100 one tab p.o. daily, Sertraline 12.5 p.o. daily, Megace 10 mL once a day, Risperdal  0.5 at bedtime.   DIET: Regular.   ACTIVITY: As tolerated.   TIME SPENT:  Thirty-five minutes.    ____________________________ Lacie Scotts Allena Katz, MD shp:tr D: 01/22/2015 16:49:41 ET T: 01/22/2015 17:20:19 ET JOB#: [id number]  cc: Willys Salvino H. Allena Katz, MD, <Dictator> Charise Carwin MD ELECTRONICALLY SIGNED 01/28/2015 14:18
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
## 2015-03-15 NOTE — H&P (Signed)
PATIENT NAME:  [name] [name], [name] [name] MR#:  [id number] DATE OF BIRTH:  1961/02/10  DATE OF ADMISSION:  01/20/2015 admit from [facility] \\Referring  physician Dr.Hernandez  Reason for admission;Hyponatremia/Hypokalemia  HISTORY OF PRESENT ILLNESS: This 54 year old female patient with history of major depression, Crohn's disease, atrial insufficiency, admitted to [facility] on 01/16/2015 for major depression with suicidal ideation. Admitted to behavioral health unit and we were asked to see the patient in consult for cachexia and weight loss. Dr. 03/18/2015  >> has seen the patient and we signed off on the case, but today, we were called, stating the patient's sodium was low at 128 and potassium was 3.2   . The patient says that she is not drinking enough water here, but she drinks a decent amount of water at home . she also feels thirsty and has a very dry mouthnow. She has no problem urinating and urine output is normal. She also states that she takes potassium supplement at home, that she is not getting it here. She has no chest pain, no trouble breathing, no cough, no fever. Overall, her mood is better. She says she feels better than when she came in. Also complains of weight loss, about 8 pounds in about a month. She denies any loss of appetite. No constipation. No diarrhea. No shortness of breath. No cough. The patient had a history of Crohn's disease and renal insufficiency, but her cortisol levels were normal.  ACTH was within normal limits.   PAST MEDICAL HISTORY: Significant for history of major depression, iron deficiency anemia, renal insufficiency and chronic migraines.   FAMILY HISTORY: Significant for a father who had dementia and some hallucinations, but no history of cancer. No diabetes. No hypertension.   SOCIAL HISTORY: The patient has never been married, no children and no smoking, no drinking, no drugs.  PAST SURGICAL HISTORY:  None.  MEDICATIONS:  She was on Colace 100 mg b.i.d. at behavioral health  unit. She is also on milk of magnesia as needed for constipation. Multivitamin 1 tablet daily, Zoloft 12.5 mg p.o. daily, Megace 200 mg p.o. daily, Risperdal 0.5 mg at bedtime.   REVIEW OF SYSTEMS:  CONSTITUTIONAL: The patient has cachexia, but no fever. Has some fatigue.  The patient does have weight loss.  EYES: No blurred vision.  ENT: No tinnitus. No epistaxis. No difficulty swallowing.  RESPIRATIONS: No cough. No wheezing. No chronic obstructive pulmonary disease.  CARDIOVASCULAR: No chest pain. No orthopnea. No PND.  GASTROINTESTINAL: No nausea. No vomiting. No abdominal pain.  GENITOURINARY: No dysuria or hematuria.  ENDOCRINE: No polyphagia or nocturia. The patient denies any psychological polydipsia.  INTEGUMENTARY: No skin rashes.  MUSCULOSKELETAL: No joint pain.  NEUROLOGIC: No numbness or TIAs. Has some headache sometimes. PSYCHIATRIC: Does have history of major depression, getting treated at [facility].   PHYSICAL EXAMINATION:   VITAL SIGNS: Temperature 98.7, pulse 77, respirations 20, blood pressure 116/70, sats 98% on room air.  GENERAL: She is a cachectic, emaciated female in no distress.  HEENT:  PERRLA. EOMI. No conjunctivitis. Hearing is intact. Tympanic membranes clear.  Pharyngeal mucosal is slightly red tongue oral mucosa erythema noted. Mucous membranes are dry.  NECK: No thyroid enlargement. No JVD. No carotid bruit. No lymphadenopathy. Respirations clear to auscultation. No wheeze. No rales. The patient's breath sounds are present bilaterally.  Not using accessory muscles for respiration.  CARDIOVASCULAR: S1, S2 regular. No murmurs. PMI not displaced. Good femoral and pedal pulses. No extremity edema.  ABDOMEN: Soft, nontender, nondistended. Bowel sounds present.  No hepatosplenomegaly.  EXTREMITIES: No evidence of cyanosis or clubbing. No peripheral edema. The patient has 2+ pedal and radial pulses bilaterally.  NEUROLOGIC: Awake, alert, oriented x3. No focal sensory or  motor deficits bilaterally. SKIN: Slightly dehydrated.  LYMPHATIC: No cervical or axillary lymphadenopathy.    ALLERGIES: THE PATIENT HAS MULTIPLE ALLERGIC TO BIAXIN, CEFADROXIL, CIPRO, ERYTHROMYCIN, FLOXIN AND LEVAQUIN.   LABORATORY: The patient's labs obtained this morning revealed a  glucose 115, BUN 9, creatinine 0.5, sodium 128, potassium 3.2, chloride 93, bicarbonate 25. The patient had a CT chest, abdomen and pelvis on 01/16/2015, which showed no acute findings of the chest, abdomen or pelvis. She has an indeterminate 1.1 cm left kidney nodule. Needs repeat follow up in 6 months.   ASSESSMENT AND PLAN:  1. This is a 54 year old female with hyponatremia and hypokalemia likely secondary to dehydration, decreased p.o. intake due to distaste in the mouth, so admitted to medicine, start her on IV fluids, normal saline 50 mL an hour, restart p.o. potassium supplements. Check urine sodium, serum osmolality, start her on fluid restriction tomorrow see if she has any psychologic polydipsia.  2. History of depression. The patient is followed by psychiatry.  Restart her medication and continue IVC and obtain psychiatry consult.   TIME SPENT: 55 minutes.  ____________________________ Katha Hamming, MD sk:ap D: 01/20/2015 14:31:24 ET T: 01/20/2015 16:23:33 ET JOB#: [id number]  cc: Katha Hamming, MD, <Dictator> Katha Hamming MD ELECTRONICALLY SIGNED 01/20/2015 18:32

## 2015-03-15 NOTE — Consult Note (Signed)
PATIENT NAME:  [name] [name], THREET MR#:  [id number] DATE OF BIRTH:  1961/08/12  DATE OF CONSULTATION:  01/15/2015  REFERRING PHYSICIAN:   CONSULTING PHYSICIAN:  Audery Amel, MD  IDENTIFYING INFORMATION AND REASON FOR CONSULTATION: A 54 year old woman with a past history of depression who comes to the hospital under petition after asking her brother to give her medicine to kill herself with.   CHIEF COMPLAINT: "I didn't really mean it."   HISTORY OF PRESENT ILLNESS: Information from the patient and the chart. Commitment paperwork states that she went to her brother's house and told him that she wanted to die and asked for medicine to kill herself with. He called 911, and it looks like Patent examiner took out a petition. The patient says that she did not really mean it. She was feeling frustrated and was trying to communicate to her brother that she needed more help taking care of their father. She admits, however, that she has not been sleeping very well in awhile and that she has hardly slept at all for several days. She admits that she does not eat well, but she is rather evasive about the details of it. She says she does feel like she is under some stress but tends to minimize her symptoms of depression. She claims that she was not actually wanting to kill herself. Denies suicidal ideation. Admits that she will occasionally hear funny sounds around the house but denies hearing voices. Denies other psychotic symptoms. She is not currently taking any psychiatric medicine or getting any outpatient psychiatric followup. She claims that she continues to be the primary caregiver for her elderly father.   PAST PSYCHIATRIC HISTORY: The patient was admitted to the hospital in the spring of 2014 under rather similar circumstances. She had a severe depression at that time and was also paranoid and withdrawn. Eventually, she seemed to respond to Seroquel with an improvement in symptoms. She said that after  leaving here she followed up briefly with Dr. [name] at [facility] but then quit going and has not been on any medicine in quite awhile. She denies any other psychiatric hospitalization. Denies any other suicide attempts since then. She claims that she is not intentionally trying to stop eating. She has excuses for why she is losing so much weight.   SOCIAL HISTORY: The patient lives with her elderly father. She claims that she is the primary caretaker for him although when she explains that it sounds like he is actually able to basically take care of himself around the house. She says she goes out and buys the groceries and cooks the meals, but that is about it. In 2014, she was talking about the same thing, and it turned out from what the family said that actually the father was more taking care of her than vice versa. She is not married and has no children. She has a brother who is concerned about her, but she says she sees him only occasionally.   PAST MEDICAL HISTORY: The patient is extraordinarily underweight. She denies or minimizes any other medical problems, however. She says she does have constipation and takes Colace for it. Recently, she complained of a "fluttery feeling" in her chest and went to her doctor but says that there was no diagnosis.   SUBSTANCE ABUSE HISTORY: She says she does not drink although many, many years ago she had a drinking problem. She indicates that it has been years to decades since then. Denies any other drug use.  FAMILY HISTORY: Denies family history of mental illness.   CURRENT MEDICATIONS: Colace p.r.n.   ALLERGIES: BIAXIN, (Dictation Anomaly) << MISSING TEXT>>, CIPROFLOXACIN, ERYTHROMYCIN AND LEVAQUIN.   REVIEW OF SYSTEMS: The patient minimizes all of her complaints. No longer complains of suicidal ideation. No longer actively feeling the fluttery feeling in her chest. Admits that she is tired and does not sleep very well. Minimizes mood symptoms.   MENTAL STATUS  EXAMINATION: Emaciated-looking woman who looks older than her stated age. Cooperative with the interview. Good eye contact. Normal psychomotor activity. Speech normal rate, tone and volume. Affect is somewhat constricted. Mood is stated as being tired. Thoughts are slow. Lucid but a little evasive. No obvious delusions. Denies auditory or visual hallucinations acutely. Denies suicidal or homicidal ideation. She is alert and oriented x4. Can repeat 3 words immediately, remembers 2/3 at 3 minutes. Judgment and insight appear to be chronically poor. Intelligence probably normal.   LABORATORY RESULTS: Drug screen is all negative. Her urinalysis shows 3+ leukocyte esterase, also positive for both blood and white cells, also positive for ketones. CBC is normal. Chemistry panel: Slightly low potassium 3.2, elevated bilirubin 1.1. Elevated AST 55, remarkably protein and albumin normal. Alcohol level negative.   VITAL SIGNS: Blood pressure 122/78, respirations 20, pulse 81, temperature 97.8.   ASSESSMENT: A 54 year old woman with a history of major depression with psychotic features who has been noncompliant with treatment and returns to the hospital after having made suicidal statements at home. She is minimizing her symptoms right now.   PHYSICAL EXAMINATION:  GENERAL: She is truly remarkably underweight, currently only 82 pounds. It looks like she has probably been starving. It sounds like she is not taking care of herself well. I am very concerned about her health and well being and think that for her safety it is best to admit her to the hospital.   TREATMENT PLAN: The patient will be admitted to the psychiatric service. Fall precautions as well as suicide precautions in place. I have ordered a regular diet as well as adding Ensure. I am putting off ordering anything except a low dose of Seroquel 25 mg at night to start with. The primary team can work on other treatment options. We can probably go ahead and  get an EKG because of the recent fluttering in her chest.   DIAGNOSIS, PRINCIPAL AND PRIMARY:  AXIS I: Major depression, severe, recurrent.   SECONDARY DIAGNOSES:  AXIS I: Deferred.  AXIS II: Deferred.  AXIS III: Underweight, chronic constipation    ____________________________ Audery Amel, MD jtc:JT D: 01/15/2015 11:45:06 ET T: 01/15/2015 12:02:21 ET JOB#: [id number]  cc: Audery Amel, MD, <Dictator> Audery Amel MD ELECTRONICALLY SIGNED 01/20/2015 10:41

## 2015-03-15 NOTE — H&P (Signed)
PATIENT NAME:  KATHLEAN, PAONE MR#:  [id number] DATE OF BIRTH:  09-10-1961  DATE OF ADMISSION:  01/20/2015  The patient is an admit from the Behavioral Health Unit.   REFERRING PHYSICIAN: Dr. Ardyth Harps  REASON FOR ADMISSION: Hyponatremia/Hypokalemia  HISTORY OF PRESENT ILLNESS: This 54 year old female patient with history of major depression, Crohn's disease, atrial insufficiency, admitted to [facility] on 01/16/2015 for major depression with suicidal ideation. Admitted to behavioral health unit and we were asked to see the patient in consult for cachexia and weight loss. Dr. Cherlynn Kaiser has seen the patient and we signed off on the case, but today, we were called, stating the patient's sodium was low at 128 and potassium was 3.2 so Dr. Ardyth Harps called [place] to see if we could admit to medicine. The patient says that she is not drinking enough water here, but she drinks a decent amount of water at home. She also feels thirsty and has a very dry mouth now. She has no problem urinating and urine output is normal. She also states that she takes potassium supplement at home, that she is not getting it here. She has no chest pain, no trouble breathing, no cough, no fever. Overall, her mood is better. She says she feels better than when she came in. Also complains of weight loss, about 8 pounds in about a month. She denies any loss of appetite. No constipation. No diarrhea. No shortness of breath. No cough. The patient had a history of Crohn's disease and renal insufficiency, but her cortisol levels were normal.  ACTH was within normal limits.   PAST MEDICAL HISTORY: Significant for history of major depression, iron deficiency anemia, renal insufficiency and chronic migraines.   FAMILY HISTORY: Significant for a father who had dementia and some hallucinations, but no history of cancer. No diabetes. No hypertension.   SOCIAL HISTORY: The patient has never been married, no children and no smoking, no drinking, no  drugs.  PAST SURGICAL HISTORY:  None.  MEDICATIONS:  She was on Colace 100 mg b.i.d. at behavioral health unit. She is also on milk of magnesia as needed for constipation. Multivitamin 1 tablet daily, Zoloft 12.5 mg p.o. daily, Megace 200 mg p.o. daily, Risperdal 0.5 mg at bedtime.   REVIEW OF SYSTEMS:  CONSTITUTIONAL: The patient has cachexia, but no fever. Has some fatigue.  The patient does have weight loss.  EYES: No blurred vision.  ENT: No tinnitus. No epistaxis. No difficulty swallowing.  RESPIRATIONS: No cough. No wheezing. No chronic obstructive pulmonary disease.  CARDIOVASCULAR: No chest pain. No orthopnea. No PND.  GASTROINTESTINAL: No nausea. No vomiting. No abdominal pain.  GENITOURINARY: No dysuria or hematuria.  ENDOCRINE: No polyphagia or nocturia. The patient denies any psychological polydipsia.  INTEGUMENTARY: No skin rashes.  MUSCULOSKELETAL: No joint pain.  NEUROLOGIC: No numbness or TIAs. Has some headache sometimes. PSYCHIATRIC: Does have history of major depression, getting treated at [facility].   PHYSICAL EXAMINATION:   VITAL SIGNS: Temperature 98.7, pulse 77, respirations 20, blood pressure 116/70, sats 98% on room air.  GENERAL: She is a cachectic, emaciated female in no distress.  HEENT:  PERRLA. EOMI. No conjunctivitis. Hearing is intact. Tympanic membranes clear.  Pharyngeal mucosal is slightly red tongue oral mucosa erythema noted. Mucous membranes are dry.  NECK: No thyroid enlargement. No JVD. No carotid bruit. No lymphadenopathy. Respirations clear to auscultation. No wheeze. No rales. The patient's breath sounds are present bilaterally.  Not using accessory muscles for respiration.  CARDIOVASCULAR: S1, S2 regular. No  murmurs. PMI not displaced. Good femoral and pedal pulses. No extremity edema.  ABDOMEN: Soft, nontender, nondistended. Bowel sounds present. No hepatosplenomegaly.  EXTREMITIES: No evidence of cyanosis or clubbing. No peripheral edema. The  patient has 2+ pedal and radial pulses bilaterally.  NEUROLOGIC: Awake, alert, oriented x3. No focal sensory or motor deficits bilaterally. SKIN: Slightly dehydrated.  LYMPHATIC: No cervical or axillary lymphadenopathy.   ALLERGIES: THE PATIENT HAS MULTIPLE ALLERGIC TO BIAXIN, CEFADROXIL, CIPRO, ERYTHROMYCIN, FLOXIN AND LEVAQUIN.   LABORATORY: The patient's labs obtained this morning revealed a  glucose 115, BUN 9, creatinine 0.5, sodium 128, potassium 3.2, chloride 93, bicarbonate 25. The patient had a CT chest, abdomen and pelvis on 01/16/2015, which showed no acute findings of the chest, abdomen or pelvis. She has an indeterminate 1.1 cm left kidney nodule. Needs repeat follow up in 6 months.   ASSESSMENT AND PLAN:  1. This is a 54 year old female with hyponatremia and hypokalemia likely secondary to dehydration, decreased p.o. intake due to dis taste in the mouth, so admitted to medicine, start her on IV fluids, normal saline 50 mL an hour, restart p.o. potassium supplements.  Check urine sodium, serum osmolality,urine osmolality ,  to see if she has  SIADH due to her psch meds vs  psychologic polydipsia.  2. History of depression. The patient is followed by psychiatry.  Restart her medication and continue IVC and obtain psychiatry consult.   TIME SPENT: 55 minutes.   ____________________________ Katha Hamming, MD sk:ap D: 01/20/2015 14:31:00 ET T: 01/20/2015 16:23:33 ET JOB#: [id number]  cc: Katha Hamming, MD, <Dictator> Katha Hamming MD ELECTRONICALLY SIGNED 01/30/2015 13:53

## 2015-03-15 NOTE — Consult Note (Signed)
PATIENT NAME:  [name] [name], [name] [name] MR#:  [id number] DATE OF BIRTH:  11/24/1960  DATE OF CONSULTATION:  01/24/2015  CONSULTING PHYSICIAN:  Shemekia Patane K. Romel Dumond, MD  PLACE OF DICTATION: [facility], room #110, [place], [place]  AGE: 54 years.  SEX: Female.  RACE: White.  SUBJECTIVE: The patient was seen in consultation in room #110 at the request of Dr. Ardyth Harps. The patient is a 54 year-old white female who was seen in the room with the sitter and with her brother, and with her father who is 35 years old. The patient is not employed and last worked for [employer] and quit working a few years ago because of physical problems, and the patient reports that she had pneumonia 3 times and she could not work anymore. The patient and father live in a house. According to the information obtained from the brother, the patient drove to his house and she asked him if he could give her some pills so that she can die. The brother tried to talk her out of it and could not and so he recommended that she come here for help.    PAST PSYCHIATRIC HISTORY: A long history of mental illness, history of inpatient on psychiatry on 2 occasions before. The first inpatient hospitalization on psychiatry was 2 years ago at [facility] under the care of Dr. [name], and she was an inpatient for 21 days. Second inpatient hospitalization on psychiatry was a few months ago on Behavioral Health under the care of Dr. Ardyth Harps. No history of suicide attempt. Brother reported that she was given a followup appointment in an outpatient mental clinic, but she refuses to go for outpatient appointment and she refuses to take her medications.    ALCOHOL AND DRUGS: Denies drinking alcohol. Denies street or prescription drugs. Denies smoking nicotine cigarettes. According to information obtained, the patient had been referred to a higher level of care, that is [facility], where there is an eating disorder clinic where she needs help for her  anorexia nervosa. According to the staff and Dr. Ardyth Harps, the patient has been refusing oral fluids and refusing to eat any food. She had been pulling out her IVs and she has been asking to drink water and nursing staff requested that she should drink Gatorade or other juices. According to the information obtained from the brother, the patient had mental illness dating back to when she was 54 years old and for the past 20 years she has been weighing 100 pounds or below that. The patient immediately confronted and stated on one occasion she was weighing 102 pounds.   MENTAL STATUS: The patient was seen lying in bed, alert and oriented, very agitated, upset, and irritable. She was confrontational with her brother whenever information was given and she started yelling and stating "I weighed 102 pounds. I was not below 100 pounds. That is not true, that is not true," she went on stating that. Does not appear to be responding to any stimuli, but brother reported that she does hear voices. Feels low and down and depressed. Has no interest in living and she told the nursing staff that she would not eat and drink, she will die, and she could not care. Insight and judgment are guarded. Impulse control is poor. In fact, insight and judgment are impaired as to making decisions for her medical problems at this time.    IMPRESSION:   1.  History of schizophrenia.   2.  Major depressive disorder, recurrent, with  suicidal ideas.   3.  Anorexia nervosa.    RECOMMENDATIONS: I recommend to transfer patient to an appropriate unit, like Behavioral Unit where eating disorder help may be obtained and the patient is already referred to an eating disorder clinic and, hopefully, a bed will be available. Meanwhile, staff reports that the patient labs are stable and the floor physician states that she could probably get better help on Behavioral Health Unit. Meanwhile, the patient is not able to make medical decisions about her  health and she does need treatment and she is not able to consent and not confident enough medically to make decisions for her treatment which she needs to survive.     ____________________________ Jannet Mantis. Guss Bunde, MD skc:at D: 01/24/2015 18:38:00 ET T: 01/24/2015 20:45:16 ET JOB#: [id number]  cc: Monika Salk K. Guss Bunde, MD, <Dictator> Beau Fanny MD ELECTRONICALLY SIGNED 01/25/2015 16:36

## 2015-03-15 NOTE — Consult Note (Signed)
Brief Consult Note: Diagnosis: 1. Cachexia/malnutrition 2. hx of Adrenal Insufficiency 3. hx of Crohn's disease 4. Depression.   Patient was seen by consultant.   Consult note dictated.   Orders entered.   Comments: 54 yo female w/ hx of crohn's disease, cachexia/malnutrition, adrenal sufficiency came into hospital due to suicidal ideations and admitted to behavioral medicine.  Hospitalist consulted for cachexia/adrenal insufficiency.   1. hx of Adrenal Insufficiency - will check random Cortisol, ACTh level.  - clinically pt. does not show any sign of acute adrenal insufficiency other than weight loss, cachexia.  - if needed consider Endocrine consult.   2. Cachexia/malnutrition - etiology unclear. ?? related to depression/poor PO intake (vs) underlying malignancy (vs) adrenal insufficiency.  - will get CT chest/abd/pelvis with contrast.    3. hx of Crohn's disease - no acute symptoms presently.   4. Depression w/ suicidal ideations - cont. care as per Psych.   Thanks for the consult and will follow with you.   Job # [id number].  Electronic Signatures: Houston Siren (MD)  (Signed 04-Mar-16 15:52)  Authored: Brief Consult Note   Last Updated: 04-Mar-16 15:52 by Houston Siren (MD)

## 2015-03-15 NOTE — Consult Note (Signed)
Psychiatry: Patient seen and chart reviewed and case discussed with nursing. Patient has no new complaints. Continues to say she cant describe what is on her mind and that she wishes she were at home. Continues to refuse food but gives no reason for this.  flat. Thoughts slow and minimal. Voice quiet and decreased speach. Patient still doesnt answer about wishes to die. Behavior today has included pulling out IV and thrashing around when nursing refused to give her extra fluid. depression. No longer with sig lab abnornalities. No longer in need of medical service. BH can take her back. notify nursing that she can be transferred back down to Dr Ardyth Harps.  Electronic Signatures: Clapacs, Jackquline Denmark (MD)  (Signed on 10-Mar-16 16:28)  Authored  Last Updated: 10-Mar-16 16:28 by Audery Amel (MD)

## 2015-03-15 NOTE — H&P (Signed)
PATIENT NAME:  [name] [name], [name] [name] MR#:  [id number] DATE OF BIRTH:  06/25/1961  DATE OF ADMISSION:  01/23/2015  REFERRING PHYSICIAN: Jimmy Footman, MD, of psychiatry.   PRIMARY CARE PHYSICIAN: None.   CHIEF COMPLAINT: None at this time.   HISTORY OF PRESENT ILLNESS: A 54 year old Caucasian female who was originally sent to the hospital on 01/14/2015 to be evaluated in Emergency Department at the request of her brother, as she was having depressive issues with suicidal speech pattern. She was originally admitted by the behavioral health services on that date for evaluation of her depressive disorder and suicidal ideation. She initially denies any suicidal ideations; however, she did remain in the hospital. She was evaluated by the medical services on March 4 for concerns of malnutrition and adrenal insufficiency. Workup performed by the medical team at that time included CT of the chest, abdomen, and pelvis looking for underlying malignancy as well as checking cortisol levels for adrenal insufficiency, all this returned normal, and the team signed off on March 5, where she remained under the care of psychiatric services. However, through that time period she continued to have refusal to eat and had increased p.o. intake of water causing them to check an electrolyte panel on March 8. At that time, she was noted to be mildly hyponatremic as well as hypokalemic. She was subsequently discharged from the behavioral services and admitted under medicine where she received 1 day duration of IV fluids with complete resolution of her electrolyte abnormalities. During that time, per documentation, she was extremely difficult and medically noncompliant, as she kept removing IVs and hindering her own care. After 1 day of IV fluid hydration, once again, her electrolytes normalized. She was subsequently discharged from the medical services and readmitted to the behavioral health on March 10. Now,  once again, on  March 11 the issue was revisited about her weight and the psychiatric service believes that she now requires NG tube placement for feeding. Thus, they discharged her from their services and requested medical admission once again. The patient has no complaints at this time other than wondering why she is being transferred through various services throughout the hospital and no one is communicating much with her.   REVIEW OF SYSTEMS:  CONSTITUTIONAL: Denies fever, fatigue, or weakness.  EYES: Denies blurred vision, double vision, eye pain.  EARS, NOSE, THROAT: Denies tinnitus, ear pain, or hearing loss. RESPIRATORY: Denies cough, wheeze, shortness of breath.   CARDIOVASCULAR: Denies chest pain, palpitations, edema.  GASTROINTESTINAL: Denies nausea, vomiting, diarrhea, or abdominal pain.  GENITOURINARY: Denies dysuria or hematuria.  ENDOCRINE: Denies nocturia or thyroid problems.  HEMATOLOGIC AND LYMPHATIC: Denies easy bruising or bleeding.  SKIN: Denies rash or lesions.  MUSCULOSKELETAL: Denies pain in neck, back, shoulder, knees, hips, or arthritic symptoms.  NEUROLOGIC: Denies paralysis or paresthesias.  PSYCHIATRIC: Currently denies any suicidal ideation or homicidal ideation.   PAST MEDICAL HISTORY: Per documentation of Crohn disease, adrenal insufficiency, as well as major depressive disorder, not otherwise specified.   SOCIAL HISTORY: No alcohol, tobacco, or drug usage.   FAMILY HISTORY: Hypertension as well as hypothyroidism.   ALLERGIES: TO BIAXIN, CIPRO, ERYTHROMYCIN, LEVAQUIN, FLOXIN.   HOME MEDICATIONS: None; however, during her course of hospitalization she has been placed on Megace 400 mg daily, olanzapine 5 mg at bedtime, Colace 100 mg p.o. b.i.d., potassium 20 mEq daily, multivitamin 1 tablet daily, thiamine 100 mg daily, as well as Ensure 3 times daily.   PHYSICAL EXAMINATION:  VITAL SIGNS: Temperature 98.4, heart  rate 96, respirations 18, blood pressure 146/95, saturating  92% on room air. Weight 44 kg. Looking through the documentation of weight back to March 2014, she essentially has weighed between 33 kg and 38 kg.   GENERAL: Cachectic, weak appearing Caucasian female currently in no acute distress.  HEAD: Normocephalic, atraumatic.  EYES: Pupils equal, round, react to light. Extraocular muscles intact. No scleral icterus.  MOUTH: Moist mucous membranes. Dentition intact. No abscess noted.  EAR, NOSE, AND THROAT: Clear without exudates. No external lesions.  NECK: Supple. No thyromegaly. No nodules. No JVD.  PULMONARY: Clear to auscultation bilaterally without wheezes, rubs, or rhonchi. No use of accessory muscles. Good respiratory effort.  CHEST: Nontender to palpation.  CARDIOVASCULAR: S1, S2, regular rate and rhythm. No murmurs, rubs, or gallops. No edema. Pedal pulses 2+ bilaterally.  GASTROINTESTINAL: Soft, nontender, nondistended. No masses. Positive bowel sounds. No hepatosplenomegaly.  MUSCULOSKELETAL: No swelling, clubbing, or edema. Range of motion full in all extremities.  NEUROLOGIC: Cranial nerves II-XII intact. No gross focal neurologic deficits. Sensation intact. Reflexes intact.  SKIN: No ulceration, lesions, rashes, or cyanosis. Skin warm, dry. Turgor intact.  PSYCHIATRIC: Mood and affect are flattened. She is, however, awake, alert, oriented x 3. Her responses are guarded at one point during my examination she actually got up walked around the room, closed the door, sat back down for fear that someone was listening to [place]. Insight and judgment poor.   LABORATORY DATA: Sodium of 135, potassium 3.5, chloride of 98, bicarbonate 27, BUN 9, creatinine 0.69, glucose of 135.   ASSESSMENT AND PLAN: A 54 year old Caucasian female, once again, originally admitted on March 2 for psychiatric issues has had 2 subsequent medical evaluations, one for weight loss, one for electrolyte abnormalities, was discharged from the psychiatric services today and sent to  the medical floor for her severe weight loss.  1.  Severe anorexia nervosa, body mass index less than 15. Continued refusal to eat. Fortunately labs and vital signs are within normal limits. From our documentation ranging back from March 2014, aside from two outlier readings, her weight has ranged between 33.1 kg and 38.3 kg. She does have a higher measurements on 01/30/2013 of 45 kg with a body mass index of 18; however, this was repeated on the same day now kg of 36.7 and body mass index of 13. During her current admission, 01/14/2015 through March 10 and she does have some weight variation between 33.1 kg, 33.5 kg, 37.1 kg, 43.8 kg, and 33.1 kg. Essentially one outlying weight of 43.8 kg, which she was measured in the bed; however, all of her standing were in the low 30 range with body mass index between the 12 and 13 range. Regardless, severe malnutrition and cachexia per our documentation at least since 2014; however, per the detailed psychiatric notes from Dr. Ardyth Harps, it seems that the patient has, unfortunately, been dealing with various psychiatric issue since the age of 73. She has already been working on placement with [facility] for their eating disorder clinic. Regardless, this will be difficult situation and treatment plan. We will consult psychiatry for their continued input in this patient's condition. We will recheck electrolytes to make sure they continue to remain normal. She may require further supplementation in the near future.   2.  Major depressive disorder with psychotic features. Consult psychiatry. Continue olanzapine. Further recommendations.   CODE STATUS: The patient is full code.   Time spent: 55 minutes.    ____________________________ Cletis Athens. Faith Branan, MD dkh:bm D:  01/24/2015 01:48:39 ET T: 01/24/2015 03:08:55 ET JOB#: [id number]  cc: Cletis Athens. Timmie Calix, MD, <Dictator> Albert Devaul Synetta Shadow MD ELECTRONICALLY SIGNED 01/24/2015 20:20

## 2015-03-15 NOTE — Consult Note (Signed)
PATIENT NAME:  [name] [name], [name] [name] MR#:  [id number] DATE OF BIRTH:  04/03/1961  DATE OF CONSULTATION:  01/16/2015  REFERRING PHYSICIAN:  Dr. Ardyth Harps CONSULTING PHYSICIAN:  Rolly Pancake. Cherlynn Kaiser, MD  PRIMARY CARE PHYSICIAN: Does not have one.   REASON FOR CONSULTATION: Cachexia, malnutrition and also history of adrenal insufficiency.   HISTORY OF PRESENT ILLNESS: This is a 54 year old female who was admitted to behavioral medicine for depression with suicidal ideations. The patient has a history of adrenal insufficiency and also Crohn disease. She was noted to be significantly malnourished with a BMI of 12. Hospitalist services were contacted for further treatment and evaluation. As per the patient, she says her normal weight usually is around 90 to 95 pounds. She currently weighs 74 pounds. She has not had any significant weight loss acutely in the past 6 to 12 months. She admits to having a sour taste in her mouth when she tries to eat. Denies any diarrhea. Denies any melena or hematochezia, any chest pain, shortness of breath, or any other associated symptoms presently. The patient says that she was diagnosed with adrenal insufficiency in the late 90s. She used to be on prednisone for her Crohn disease, which she thinks is what caused her adrenal insufficiency.   REVIEW OF SYSTEMS: CONSTITUTIONAL: No documented fever. No weight gain. Positive weight loss, about 5 to 10 pounds over the past year or so.  EYES: No blurred or double vision.  ENT: No tinnitus or postnasal drip. No redness of the oropharynx.  RESPIRATORY: No cough, no wheeze, no hemoptysis, no dyspnea.  CARDIOVASCULAR: No chest pain, no orthopnea, no palpitation, no syncope.  GASTROINTESTINAL: No nausea, vomiting, diarrhea, or abdominal pain. No melena or hematochezia.  GENITOURINARY: No dysuria or hematuria.  ENDOCRINE: No polyuria or nocturia. No heat or cold intolerance.  HEMATOLOGIC: No anemia. No bruising. No bleeding.   INTEGUMENTARY: No rashes. No lesions.  MUSCULOSKELETAL: No arthritis. No swelling. No gout.  NEUROLOGIC: No numbness, tingling, or ataxia. No seizure-type activity.  PSYCHIATRIC: Positive depression. No anxiety. No ADD.  PAST MEDICAL HISTORY: Consistent with history of Crohn disease, history of adrenal insufficiency, depression.   ALLERGIES: No known drug allergies.   SOCIAL HISTORY: Used to be a smoker, quit many many years ago. Also used to drink heavily, quit many years ago. No illicit drug abuse. Lives by herself.   FAMILY HISTORY: The patient's father is alive, has a history of hypertension and hypothyroidism. Mother died of cancer of unknown type.   CURRENT MEDICATIONS: She is currently on no medication.   PHYSICAL EXAMINATION: Presently is as follows:  VITAL SIGNS: Temperature 98.6, pulse 77, respirations 20, blood pressure 116/76, sats 98% on room air.  GENERAL: She is a cachectic, emaciated-appearing female in no apparent distress.  HEAD, EYES, EARS, NOSE AND THROAT: Atraumatic, normocephalic. Extraocular muscles are intact. Pupils equal and reactive to light. Sclerae anicteric. No conjunctival injection. No pharyngeal erythema.  NECK: Supple. No jugular venous distention. No bruits, no lymphadenopathy, no thyromegaly.  HEART: Regular rate and rhythm. No murmurs, no rubs, no clicks.  LUNGS: Clear to auscultation bilaterally. No rales or rhonchi. No wheezes.  ABDOMEN: Soft, flat, nontender, nondistended. Has good bowel sounds. No hepatosplenomegaly appreciated.  EXTREMITIES: No evidence of any cyanosis, clubbing, or peripheral edema. Has +2 pedal and radial pulses bilaterally.  NEUROLOGIC: She is alert, awake and oriented x3 with no focal motor or sensory deficits appreciated bilaterally.  SKIN: Moist and warm with no rashes.  LYMPHATIC: There is no cervical  or axillary lymphadenopathy.  BREASTS: On the left side, of her breast, she has a dry excoriated area near her areola, but  no palpable mass noted.   ASSESSMENT AND PLAN: This is a 54 year old female with history of Crohn disease, cachexia, malnutrition and adrenal insufficiency who presents to the hospital due to suicidal ideation, admitted to Behavioral Medicine. Hospitalist services were consulted for cachexia and adrenal insufficiency. 1.  History of adrenal insufficiency. I will go ahead and check a random cortisol and ACTH level for now. Clinically, the patient does not show any signs of acute adrenal insufficiency. She is not hypotensive. She is not hyperkalemic. She just has weight loss and cachexia, which could be coming from many other reasons. If her cortisol and ACTH levels are abnormal, I will consider getting an endocrinology consult.  2.  Cachexia/malnutrition. The exact etiology of this is unclear, whether this is related to depression versus underlying malignancy versus adrenal insufficiency. As per the patient, she did have an abnormal mammogram a few years back. She does have a dry excoriated area around her areola of her left breast. I will get a CT chest, abdomen and pelvis with contrast to rule out underlying malignancy. Work-up adrenal insufficiency as mentioned above.  3.  History of Crohn disease. The patient acutely has no gastrointestinal symptoms.  4.  Depression with suicidal ideation. Continue care as per psychiatry.   Thank you so much for the consultation. We will follow along with you.   TIME SPENT ON CONSULTATION: 50 minutes.   ____________________________ Rolly Pancake. Cherlynn Kaiser, MD vjs:sb D: 01/16/2015 15:51:57 ET T: 01/16/2015 16:41:34 ET JOB#: [id number]  cc: Rolly Pancake. Cherlynn Kaiser, MD, <Dictator> Houston Siren MD ELECTRONICALLY SIGNED 01/26/2015 14:36
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
## 2015-03-15 NOTE — Discharge Summary (Signed)
PATIENT NAME:  [name] [name], [name] [name] MR#:  [id number] DATE OF BIRTH:  [birth year]  DATE OF ADMISSION:  01/24/2015 DATE OF DISCHARGE:  01/26/2015   ADMITTING DIAGNOSES:  1.  Poor p.o. intake due to anorexia nervosa cachexia as a result of severe depression. 2.  Electrolyte imbalances due to poor p.o. intake status post replacement.  3.  History of adrenal insufficiency with normal cortisol noted during hospitalization.  4.  Cachexia, malnutrition, severe likely related to depression. Needs aggressive psychiatric therapy.  5.  History of Crohn disease without any evidence of acute exacerbation with negative CTA of the chest, abdomen, and pelvis.  6.  Suicidal ideation per psychiatry.   CONSULTANTS: Dr. Ardyth Harps.    PERTINENT LABORATORIES AND EVALUATIONS:  Admitting glucose was 135, BUN 9, creatinine 0.69, sodium 135, potassium 3.5, chloride 98, CO2 27, calcium was 10.4.  On discharge, creatinine, potassium was 4.6.   HOSPITAL COURSE: Please refer to H and P done by the admitting physician. The patient is a 54 year old white female who was initially admitted to behavioral medicine for suicidal ideation and depression who was transferred to medicine for poor p.o. intake. Previously, was discharged back to behavioral medicine, then we were asked to see the patient and readmit her by the psychiatrist.  Patient was brought back to the hospital and was started on IV fluids. Due to patient's refusing to eat, there was a Dobhoff placement attempted. The patient refused to have that placed. After attempting to put the Dobhoff, she was told that she would need a PEG tube.  Since then, patient started eating and drinking much better. She is being transferred back to behavioral medicine.   DISCHARGE MEDICATIONS: Megace 10 mL daily, multivitamin with minimal 1 tab p.o. daily, thiamine 100 mg 1 tab p.o. daily, Ensure Plus 1 cap t.i.d., Tylenol 650 q. 6 p.r.n., olanzapine 5 mg at bedtime, olanzapine 5 mg in the morning,  lorazepam 0.25 1 tab p.o. b.i.d. as needed, Klor-Con 10 mg 1 tab p.o. b.i.d.   DIET: Regular.   FLUID RESTRICTING:  1800 mL.   ACTIVITY: As tolerated.   DISCHARGE FOLLOWUP: Follow up with primary M.D. after discharge from behavioral medicine.  TIME SPENT: 35 minutes on this discharge.    ____________________________ Lacie Scotts. Allena Katz, MD shp:sp D: 01/27/2015 11:40:14 ET T: 01/27/2015 12:40:38 ET JOB#: [id number]  cc: Aarianna Hoadley H. Allena Katz, MD, <Dictator> Charise Carwin MD ELECTRONICALLY SIGNED 01/28/2015 14:21

## 2015-03-15 NOTE — Consult Note (Signed)
PATIENT NAME:  [name] [name], [name] [name] MR#:  [id number] DATE OF BIRTH:  June 30, 1961  DATE OF CONSULTATION:  01/30/2015  REFERRING PHYSICIAN:  Dr. Ardyth Harps CONSULTING PHYSICIAN:  Leilanny Fluitt H. Allena Katz, MD  REASON FOR CONSULTATION: Tachycardia.  HISTORY OF PRESENT ILLNESS: The patient is a 54 year old white female who is known to our service who was in the hospital recently after she was transferred from [facility] for not eating and drinking. Her sodium was low and her potassium was low. She was transferred back to Behavioral Medicine after she started eating and drinking. The patient was doing okay. Her electrolytes were better. P.o. intake was improved, but then today she was noted to have a heart rate that was elevated per nurse. The patient was complaining of some dizziness. So therefore an EKG was done which shows a heart rate of 112 with normal sinus tachycardia. The patient does report feeling a little anxious, but denies any chest pain or shortness of breath. Denies any nausea or vomiting or diarrhea. Has been able to eat. Complains of some nasal congestion. Denies any fevers or chills.   PAST MEDICAL HISTORY: Per documentation: 1.  Crohn disease, CT scan done recently was negative. 2.  Adrenal insufficiency, recent cortisol level was normal. 3.  Major depressive disorder.   SOCIAL HISTORY: Does not smoke. Does not drink. No drug use.   FAMILY HISTORY: Positive for hypertension as well as hypothyroidism.   ALLERGIES: BIAXIN, CIPRO, ERYTHROMYCIN, LEVAQUIN, OFLOXACIN.   CURRENT MEDICATIONS: Colace 100 b.i.d., multivitamin 1 tab p.o. daily, potassium chloride 10 mEq one tab p.o. b.i.d., Tylenol 650 q. 4 p.r.n., clonazepam 0.5 q. 6 p.r.n., senna 2 tabs at bedtime, Flonase 2 sprays both nostrils daily, Megace 400 mg daily, Reglan 5 mg p.o. b.i.d. p.r.n. bloating, olanzapine 5 mg b.i.d.   REVIEW OF SYSTEMS: CONSTITUTIONAL: Denies any fevers. Complains of some fatigue and weakness.  EYES: No  blurred vision, double vision.  EAR, NOSE, AND THROAT: Denies any tinnitus, ear pain. No hearing loss. Complains of sinus congestion.  RESPIRATORY: Denies any cough, wheezing, hemoptysis.  CARDIOVASCULAR: Denies any chest pain, palpitations, or edema.  GASTROINTESTINAL: Denies any nausea, vomiting or diarrhea.  GENITOURINARY: Denies any dysuria or hematuria.  ENDOCRINE: Denies any nocturia or thyroid problems.  HEMATOLOGIC AND LYMPH: Denies anemia, easy bruisability or bleeding.  SKIN: Denies any rash.  MUSCULOSKELETAL: Denies any neck, back or shoulder pain.  NEUROLOGICAL: No paralysis, CVA, TIA or seizures.  PSYCHIATRIC: Denies any suicidal ideation or homicidal ideation.   PHYSICAL EXAMINATION: VITAL SIGNS: Temperature 98.5, pulse 88, respirations 20, blood pressure 117/78, and pulse rate is 110.  GENERAL: The patient is a thin, cachectic female in no acute distress.  HEENT: Head atraumatic, normocephalic. Pupils equally round and reactive to light and accommodation. There is no conjunctival pallor. No scleral icterus. Nasal exam shows no drainage or ulceration. Oropharynx is clear without any exudate.  NECK: Supple without any thyromegaly. No JVD. No carotid bruits.  CARDIOVASCULAR: Tachycardic. No murmurs, rubs, clicks, or gallops.  LUNGS: Clear to auscultation bilaterally without any rales, rhonchi or wheezing.  ABDOMEN: Soft, nontender, nondistended. Positive bowel sounds x4. No hepatosplenomegaly.  SKIN: No rash.  LYMPH NODES: Nonpalpable.  MUSCULOSKELETAL: There is no erythema or swelling.  VASCULAR: Good DP and PT pulses.  DIAGNOSTIC DATA: WBC 5.2, hemoglobin 12.7, platelet count 215. BMP: Glucose 126, BUN 10, creatinine 0.49, sodium 141, potassium 3.8, chloride 110, CO2 23.   EKG shows sinus tachycardia with nonspecific ST changes.   ASSESSMENT AND  PLAN: The patient is a 54 year old known to [place] from previous admissions, noted to have elevated heart rate, no chest pain or  shortness of breath.  1.  Elevated heart rate. CBC is normal. BMP is normal. At this time, we will check CK-MB, troponin and d-dimer. Treat her anxiety. Give low-dose Lopressor. Monitor her heart rate. If heart rate continues to be elevated then will need transfer to the floor.  2.  Anorexia. Improved p.o. intake.  3.  History of Crohn disease, which is not active.   TIME SPENT: 55 minutes.   ____________________________ Lacie Scotts Allena Katz, MD shp:sb D: 01/30/2015 14:51:50 ET T: 01/30/2015 15:17:29 ET JOB#: [id number]  cc: Sheila Gervasi H. Allena Katz, MD, <Dictator> Charise Carwin MD ELECTRONICALLY SIGNED 02/13/2015 14:19

## 2015-03-15 NOTE — Consult Note (Signed)
PATIENT NAME:  [name], [name] MR#:  [id number] DATE OF BIRTH:  [birth year]  DATE OF CONSULTATION:  01/21/2015  REFERRING PHYSICIAN:   CONSULTING PHYSICIAN:  Audery Amel, MD  IDENTIFYING INFORMATION AND REASON FOR CONSULT: This is a 54 year old woman who has a severe major depression, probably with psychotic features. She was transferred to the medical service because of electrolyte abnormalities.   HISTORY OF PRESENT ILLNESS: Information from the patient and the chart. The patient was not able to give me much information today. She was barely speaking. She told me that her mood was feeling scared. She told me she is having bad dreams. She admitted that she is not eating anything, but could not give me any answer as to why that was. She refused to answer the questions of whether she was having any thoughts about wishing to die. The patient was transferred to the medical service yesterday because of electrolyte abnormalities. Since being up here, she is on fluid restriction and is showing some improvement in her electrolytes with improved laboratories today. She has not gotten out of bed as far as I or the nurse who is attending her can tell. Has almost no activity.   PAST PSYCHIATRIC HISTORY: The patient has had recurrent episodes of severe depression and anxiety in the past. She was hospitalized this time because of her severe cachexia and the fear the family had about her starving. The patient tends to resist treatment to some degree and has poor insight.   PAST MEDICAL HISTORY: See previous intake note from me. No other active medical problems other than her malnutrition currently.   REVIEW OF SYSTEMS: The patient was not very active in answering questions. Told me she had no complaints. She denied hallucinations, but says she is having bad dreams. Would not answer questions about suicidality. Admitted that she would not eat but would not answer a question as to why.   MENTAL STATUS  EXAMINATION: Extremely underweight, thin, sick-appearing woman. Lying on her side, did not move throughout the interview. Kept her hand over her mouth for the most part. Made almost no eye contact. Psychomotor activity almost nonexistent. Speech only a couple of words at a time and almost whispered in the tone. Affect flat and looks like it could be frightened or anxious. Mood is stated as being anxious. Thoughts appear to be minimal in their expression, cannot really evaluate otherwise. No obvious delusions. She would not answer questions about suicidal ideation. The patient was not oriented to her current situation, although she knew that basically she was in a hospital. Judgment and insight clearly impaired. Short-term memory impaired.   LABORATORY RESULTS: Chemistry panel today shows her sodium is 134, potassium 3.6, chloride 98, BUN 6, creatinine 0.8. Urine osmolality completely normal. Urine sodium was 42.   ASSESSMENT: This is a 54 year old woman with major depression, severe with psychotic features. There is a workup in place for possible medical causes of her electrolyte abnormalities, but the fact that she is starving herself and could possibly be taking in excess fluid could also be an explanation. The patient does not appear to be improved in her mental health and she was admitted to the hospital.   CURRENT MEDICATIONS: The patient is currently on Risperdal 0.5 mg at bedtime, Zoloft 12.5 mg daily, Megace 400 mg daily.   TREATMENT PLAN: The patient is still primarily under the care of Dr. Ardyth Harps downstairs once she is able to return to the medical. I assume that Dr. Ardyth Harps has  a plan in place for treatment and I am hesitant to make dramatic changes to the patient's medication, although I am concerned about her failure to respond. I would recommend consideration be given at some point to ECT for this patient. The patient was counseled about the risks of continuing to not eat including death,  seizures and more disability. Right now, we do not have any beds down on psychiatry. I have not spoken to the internal medicine doctors yet about whether they think she is appropriate to transfer back downstairs or not, but I will follow up with them.   DIAGNOSIS, PRINCIPAL AND PRIMARY:  AXIS I: Major depression, severe, recurrent, with psychotic features.   SECONDARY DIAGNOSES:  AXIS I: No further diagnosis.  AXIS II: Deferred.  AXIS III: Malnutrition.    ____________________________ Audery Amel, MD jtc:TM D: 01/21/2015 16:23:17 ET T: 01/21/2015 16:50:25 ET JOB#: [id number]  cc: Audery Amel, MD, <Dictator> Audery Amel MD ELECTRONICALLY SIGNED 01/21/2015 23:45

## 2015-03-24 NOTE — H&P (Signed)
NAME:  [name] [name], [name] [name] [id number] OF BIRTH:  1961/03/23 OF ADMISSION:  01/15/2015  IDENTIFYING INFORMATION: A 54 year old woman with a past history of depression who comes to the hospital under petition after asking her brother to give her medicine to kill herself with.  COMPLAINT: "I didn?t really mean it."  OF PRESENT ILLNESS: Patient presented to the ED via the police under IVC for c/o, "I'm taking care of my father; he has Alzheimer?s; it's a 24 hr job; I'm really burned out; I don't like my life; today, I went to my brother's house and I asked him to give me enough pills to kill myself; then, someone prayed for me; and I felt conviction; I knew that was wrong; I am religious; I have spiritual matters going on; all I want is total relief; to just not deal with anything anymore."paperwork states that she went to her brother?s house and told him that she wanted to die and asked for medicine to kill herself with. He called 911. The patient says that she did not really mean it. She was feeling frustrated and was trying to communicate to her brother that she needed more help taking care of their father. She admits, however, that she has not been sleeping or eating well for the last 3 weeks. She says she does feel like she is under some stress but tends to minimize her symptoms of depression. She claims that she was not actually wanting to kill herself. Denies suicidal ideation. Denies other psychotic symptoms. She is not currently taking any psychiatric medicine or getting any outpatient psychiatric follow-up. She claims that she continues to be the primary caregiver for her elderly father.  is cachectic her BMI is 12.  She says her usual weight is 90lbs (now she is 73lbs).  She states that over the last 3 weeks she has not been eating because she has a bad taste in her mouth and food taste "nasty".  She somethings has epigastric pain but no other symptoms.  was guarded and at times appeared suspicious during  assessment.  She did not give permission to contact her brother as she wants to talk to him first.  She was interested on starting medications but was overly concern with possible side effects. She requested not to be restarted on Seroquel which she states cause her to have side effects.  She was unable to explain the side effects other than with some gestures: explaining a strange feeling that comes from the feet up. ABUSE HISTORY: She says she does not drink although many, many years ago she had a drinking problem. She indicates that it has been years to decades since then. Denies any other drug use.  PSYCHIATRIC HISTORY: The patient was admitted to the hospital in the spring of 2014 under rather similar circumstances. She had a severe depression at that time and was also paranoid and withdrawn. Eventually, she seemed to respond to Seroquel with an improvement in symptoms. She said that after leaving here she followed up briefly with Dr. [name] [facility] but then quit going and has not been on any medicine in quite a while. She denies any other psychiatric hospitalization. Denies any other suicide attempts since then.  MEDICAL HISTORY: patient was guarded when asked to talk about her medical history.  She said that God had healed her from all of that.  Pt has a h/o Crohn?s disease and adrenal insufficiency.  Not taking any medications. HISTORY: Denies family history of mental illness.  HISTORY: Single,  never married, no children.  Education: some college. patient lives with her elderly father. She claims that she is the primary caretaker for him although when she explains that it sounds like he is actually able to basically take care of himself around the house. She says she goes out and buys the groceries and cooks the meals, but that is about it. In 2014, she was talking about the same thing, and it turned out from what the family said that actually the father was more taking care of her than vice versa. She  is not married and has no children. She has a brother who is concerned about her, but she says she sees him only occasionally.  MEDICATIONS: Colace p.r.n.   ALLERGIES: several antibiotics.  OF SYSTEMS: weight loss, fatigue, anorexia.  The rest of the review of systems is neg. STATUS EXAMINATION: Emaciated-looking woman who looks older than her stated age. Cooperative with the interview. Good eye contact. Normal psychomotor activity. Speech normal rate, tone and volume. Affect is somewhat constricted. Mood is stated as being tired. Thoughts are slow. Lucid but a little evasive. No obvious delusions. Denies auditory or visual hallucinations acutely. Denies suicidal or homicidal ideation. She is alert and oriented x4. Can repeat 3 words immediately, remembers 2/3 at 3 minutes. Judgment and insight appear to be chronically poor. Intelligence probably normal.  RESULTS: Drug screen is all negative. Her urinalysis shows 3+ leukocyte esterase, also positive for both blood and white cells, also positive for ketones. CBC is normal. Chemistry panel: Slightly low potassium 3.2, elevated bilirubin 1.1. Elevated AST 55, remarkably protein and albumin normal. Alcohol level negative.  EXAMINATION:  SIGNS: Blood pressure 122/78, respirations 20, pulse 81, temperature 97.8. BMI 12 weight is 73 lbs53y/o wf who is cachectic. No acute distressnl gait and muscular tone. depression, severe, recurrent. insufficiency?s disease  A 54 year old woman with a history of major depression with possible psychotic features who has been noncompliant with treatment and returns to the hospital after having made suicidal statements at home. She is minimizing her symptoms right now.  will start sertraline 12.5 mg po q daywill start Ativan 0.25 mg po qhsordered multivitamins with minerals and a dietician consult.  As pt has h/o Cronh?s disease and adrenal insufficiency will request IM consult.will order b12, TSH, free T4.will order a chest  X-rayincreased [facility]CC[facility] [place]: for resent results obtained there---awaiting call back from nurse.  Addendum: spoke with nurse and reviewed records. Pt has been diagnosed with breast cancer but never received treatment as she cancelled all her f/us.  Diagnosis was made in 2011.   minutes.  Reviewed records, called [facility] in [place], coonatcted IM.     Electronic Signatures: Jimmy Footman (MD) (Signed on 04-Mar-16 15:13)  Authored   Last Updated: 07-Mar-16 16:36 by Jimmy Footman (MD)

## 2015-06-18 ENCOUNTER — Telehealth: Payer: Self-pay

## 2015-06-18 MED ORDER — METOPROLOL SUCCINATE ER 25 MG PO TB24: 25.0000 mg | ORAL_TABLET | Freq: Every day | ORAL | Status: DC

## 2015-06-18 NOTE — Addendum Note (Signed)
Addended by: Schuyler Amor on: 06/18/2015 11:07 AM   Modules accepted: Orders

## 2015-06-18 NOTE — Telephone Encounter (Signed)
Sent message to [place] 

## 2015-06-18 NOTE — Telephone Encounter (Signed)
done

## 2015-06-25 ENCOUNTER — Other Ambulatory Visit: Payer: Self-pay

## 2015-06-25 MED ORDER — METOPROLOL SUCCINATE ER 25 MG PO TB24: 25.0000 mg | ORAL_TABLET | Freq: Every day | ORAL | Status: DC

## 2015-07-10 ENCOUNTER — Ambulatory Visit: Payer: Self-pay | Admitting: Family Medicine

## 2015-07-17 ENCOUNTER — Ambulatory Visit: Payer: Self-pay | Admitting: Family Medicine

## 2015-08-05 ENCOUNTER — Ambulatory Visit (INDEPENDENT_AMBULATORY_CARE_PROVIDER_SITE_OTHER): Payer: Medicare Other | Admitting: Family Medicine

## 2015-08-05 ENCOUNTER — Encounter: Payer: Self-pay | Admitting: Family Medicine

## 2015-08-05 ENCOUNTER — Ambulatory Visit: Payer: Self-pay | Admitting: Family Medicine

## 2015-08-05 VITALS — BP 120/80 | HR 76 | Ht 66.0 in | Wt 84.0 lb

## 2015-08-05 DIAGNOSIS — F329 Major depressive disorder, single episode, unspecified: Secondary | ICD-10-CM

## 2015-08-05 DIAGNOSIS — N63 Unspecified lump in breast: Secondary | ICD-10-CM | POA: Diagnosis not present

## 2015-08-05 DIAGNOSIS — R636 Underweight: Secondary | ICD-10-CM

## 2015-08-05 DIAGNOSIS — F5000 Anorexia nervosa, unspecified: Secondary | ICD-10-CM | POA: Diagnosis not present

## 2015-08-05 DIAGNOSIS — E876 Hypokalemia: Secondary | ICD-10-CM | POA: Diagnosis not present

## 2015-08-05 DIAGNOSIS — F32A Depression, unspecified: Secondary | ICD-10-CM

## 2015-08-05 DIAGNOSIS — N6320 Unspecified lump in the left breast, unspecified quadrant: Secondary | ICD-10-CM

## 2015-08-05 NOTE — Progress Notes (Signed)
Date:  08/05/2015   Name:  [name] [name]   DOB:  09-13-1961   MRN:  [id number]  PCP:  Tillman Abide, MD    Chief Complaint: breast complaint   History of Present Illness:  This is a 54 y.o. female for eval L breast mass. States has abnormal mammogram and bx 2102, saw Dr. Michela Pitcher, was to have further eval in [place] but got sick never went. Reports pain L breast past two months, none before, no drainage noted. Seeing Dr. Elesa Massed for her anorexia and severe depression, next appt 08/18/15. Requests blood work to check potassium and thyroid. Cortisol levels normal in hospital.  Review of Systems:  Review of Systems  Constitutional: Negative for fever and chills.    Patient Active Problem List   Diagnosis Date Noted  . Anorexia nervosa 08/05/2015  . Underweight 08/05/2015  . Hypokalemia 08/05/2015  . Depression 08/05/2015  . 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 PALPITATIONS 02/07/2010  . HERPES ZOSTER 02/08/2008  . MIGRAINE HEADACHE 02/08/2008  . 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 IBD 02/08/2008  . CONSTIPATION, CHRONIC 02/08/2008  . ANEMIA, IRON DEFICIENCY 01/25/2008  . CROHN'S DISEASE, LARGE INTESTINE 01/25/2008  . OSTEOARTHRITIS 01/25/2008  . POLYARTHRALGIA 01/25/2008  . OSTEOPOROSIS 01/25/2008    Prior to Admission medications   Medication Sig Start Date End Date Taking? Authorizing Dashea Mcmullan  LORazepam (ATIVAN) 1 MG tablet Take 0.5 mg by mouth 2 (two) times daily.   Yes Historical Raziyah Vanvleck, MD  megestrol (MEGACE) 40 MG tablet Take 40 mg by mouth daily.   Yes Historical Cathern Tahir, MD  metoprolol succinate (TOPROL-XL) 25 MG 24 hr tablet Take 1 tablet (25 mg total) by mouth daily. 06/25/15  Yes Schuyler Amor, MD  mirtazapine (REMERON) 30 MG tablet Take 30 mg by mouth at bedtime.   Yes Historical Lyla Jasek, MD  potassium chloride (K-DUR) 10 MEQ tablet Take 10 mEq by mouth 2 (two) times daily.   Yes Historical Jerzy Roepke, MD  QUEtiapine (SEROQUEL XR) 200 MG 24 hr tablet Take  200 mg by mouth at bedtime.   Yes Historical Danity Schmelzer, MD    Allergies  Allergen Reactions  . Acyclovir     Other reaction(s): Other (See Comments) Orally, triggers a migraine  . Amoxicillin-Pot Clavulanate     REACTION: GI Upset  . Cefadroxil     Other reaction(s): Unknown  . Ciprofloxacin     REACTION: GI Upset.Also avelox and floxin  . Clarithromycin     REACTION: GI Upset  . Doxycycline     Other reaction(s): Unknown  . Erythromycin   . Levofloxacin   . Sulfamethoxazole-Trimethoprim     REACTION: itching  . Albumin (Human) Nausea And Vomiting  . Penicillins Nausea Only    Past Surgical History  Procedure Laterality Date  . Breast biopsy Bilateral   . Colonoscopy  1993    crohn's disease    Social History  Substance Use Topics  . Smoking status: Never Smoker   . Smokeless tobacco: None  . Alcohol Use: No    History reviewed. No pertinent family history.  Medication list has been reviewed and updated.  Physical Examination: BP 120/80 mmHg  Pulse 76  Ht  (1.676 m)  Wt 84 lb (38.102 kg)  BMI 13.56 kg/m2  Physical Exam  Constitutional: She appears cachectic. She is cooperative.  Cardiovascular: Normal rate, regular rhythm and normal heart sounds.   Pulmonary/Chest: Effort normal and breath sounds normal.  Genitourinary:  2 cm x 1cm crusted lesion L lateral areola  Musculoskeletal: She exhibits no edema.  Neurological: She is alert.    Assessment and Plan:  1. Left breast mass Refer back to Dr. Michela Pitcher for possible bx - Ambulatory referral to General Surgery  2. Anorexia nervosa Followed by Dr. Elesa Massed - Basic Metabolic Panel (BMET)  3. Underweight - TSH  4. Depression Followed by Dr. Elesa Massed  5. Hypokalemia On oral replacement  Return in about 4 weeks (around 09/02/2015).  Dionne Ano. Kingsley Spittle MD [facility]  08/05/2015

## 2015-08-06 LAB — BASIC METABOLIC PANEL
BUN/Creatinine Ratio: 17 (ref 9–23)
BUN: 10 mg/dL (ref 6–24)
CALCIUM: 9.9 mg/dL (ref 8.7–10.2)
CO2: 22 mmol/L (ref 18–29)
CREATININE: 0.6 mg/dL (ref 0.57–1.00)
Chloride: 99 mmol/L (ref 97–108)
GFR calc Af Amer: 120 mL/min/{1.73_m2} (ref >59)
GFR, EST NON AFRICAN AMERICAN: 104 mL/min/{1.73_m2} (ref >59)
Glucose: 86 mg/dL (ref 65–99)
Potassium: 4 mmol/L (ref 3.5–5.2)
SODIUM: 138 mmol/L (ref 134–144)

## 2015-08-06 LAB — TSH: TSH: 2.81 u[IU]/mL (ref 0.450–4.500)

## 2015-08-26 ENCOUNTER — Ambulatory Visit: Payer: Medicare Other | Admitting: Surgery

## 2015-08-26 ENCOUNTER — Encounter: Payer: Self-pay | Admitting: *Deleted

## 2015-08-26 ENCOUNTER — Emergency Department
Admission: EM | Admit: 2015-08-26 | Discharge: 2015-08-27 | Disposition: A | Discharge status: Other Acute Inpatient Hospital | Payer: Medicare Other | Attending: Emergency Medicine | Admitting: Emergency Medicine

## 2015-08-26 DIAGNOSIS — F329 Major depressive disorder, single episode, unspecified: Secondary | ICD-10-CM | POA: Insufficient documentation

## 2015-08-26 DIAGNOSIS — Z880 Allergy status to penicillin: Secondary | ICD-10-CM | POA: Diagnosis not present

## 2015-08-26 DIAGNOSIS — F419 Anxiety disorder, unspecified: Secondary | ICD-10-CM | POA: Diagnosis not present

## 2015-08-26 DIAGNOSIS — F32A Depression, unspecified: Secondary | ICD-10-CM

## 2015-08-26 DIAGNOSIS — F5000 Anorexia nervosa, unspecified: Secondary | ICD-10-CM | POA: Diagnosis present

## 2015-08-26 DIAGNOSIS — R1030 Lower abdominal pain, unspecified: Secondary | ICD-10-CM | POA: Diagnosis not present

## 2015-08-26 DIAGNOSIS — Z79899 Other long term (current) drug therapy: Secondary | ICD-10-CM | POA: Insufficient documentation

## 2015-08-26 DIAGNOSIS — F209 Schizophrenia, unspecified: Secondary | ICD-10-CM

## 2015-08-26 DIAGNOSIS — R630 Anorexia: Secondary | ICD-10-CM

## 2015-08-26 DIAGNOSIS — Z008 Encounter for other general examination: Secondary | ICD-10-CM | POA: Diagnosis present

## 2015-08-26 LAB — URINE DRUG SCREEN, QUALITATIVE (ARMC ONLY)
AMPHETAMINES, UR SCREEN: NOT DETECTED
BARBITURATES, UR SCREEN: NOT DETECTED
Benzodiazepine, Ur Scrn: NOT DETECTED
COCAINE METABOLITE, UR ~~LOC~~: NOT DETECTED
Cannabinoid 50 Ng, Ur ~~LOC~~: NOT DETECTED
MDMA (Ecstasy)Ur Screen: NOT DETECTED
METHADONE SCREEN, URINE: NOT DETECTED
OPIATE, UR SCREEN: NOT DETECTED
Phencyclidine (PCP) Ur S: NOT DETECTED
Tricyclic, Ur Screen: NOT DETECTED

## 2015-08-26 LAB — COMPREHENSIVE METABOLIC PANEL
ALBUMIN: 4.8 g/dL (ref 3.5–5.0)
ALT: 21 U/L (ref 14–54)
ANION GAP: 7 (ref 5–15)
AST: 23 U/L (ref 15–41)
Alkaline Phosphatase: 53 U/L (ref 38–126)
BILIRUBIN TOTAL: 0.2 mg/dL — AB (ref 0.3–1.2)
BUN: 7 mg/dL (ref 6–20)
CHLORIDE: 100 mmol/L — AB (ref 101–111)
CO2: 28 mmol/L (ref 22–32)
Calcium: 10.1 mg/dL (ref 8.9–10.3)
Creatinine, Ser: 0.52 mg/dL (ref 0.44–1.00)
GFR calc Af Amer: >60 mL/min (ref >60)
GFR calc non Af Amer: >60 mL/min (ref >60)
GLUCOSE: 97 mg/dL (ref 65–99)
POTASSIUM: 3.7 mmol/L (ref 3.5–5.1)
SODIUM: 135 mmol/L (ref 135–145)
TOTAL PROTEIN: 7.3 g/dL (ref 6.5–8.1)

## 2015-08-26 LAB — SALICYLATE LEVEL: Salicylate Lvl: <4 mg/dL (ref 2.8–30.0)

## 2015-08-26 LAB — CBC
HEMATOCRIT: 39.1 % (ref 35.0–47.0)
HEMOGLOBIN: 13.1 g/dL (ref 12.0–16.0)
MCH: 31.9 pg (ref 26.0–34.0)
MCHC: 33.5 g/dL (ref 32.0–36.0)
MCV: 95 fL (ref 80.0–100.0)
Platelets: 215 10*3/uL (ref 150–440)
RBC: 4.11 MIL/uL (ref 3.80–5.20)
RDW: 13.8 % (ref 11.5–14.5)
WBC: 3.4 10*3/uL — ABNORMAL LOW (ref 3.6–11.0)

## 2015-08-26 LAB — ACETAMINOPHEN LEVEL

## 2015-08-26 LAB — ETHANOL: Alcohol, Ethyl (B): <5 mg/dL (ref <5)

## 2015-08-26 MED ORDER — METOPROLOL SUCCINATE ER 50 MG PO TB24: 25.0000 mg | ORAL_TABLET | Freq: Every day | ORAL | Status: DC

## 2015-08-26 MED ORDER — LORAZEPAM 1 MG PO TABS
1.0000 mg | ORAL_TABLET | Freq: Once | ORAL | Status: AC
  Administered 2015-08-26: 1 mg via ORAL
  Filled 2015-08-26: qty 1

## 2015-08-26 MED ORDER — MIRTAZAPINE 45 MG PO TABS
45.0000 mg | ORAL_TABLET | Freq: Every day | ORAL | Status: DC
  Filled 2015-08-26: qty 1

## 2015-08-26 MED ORDER — LORAZEPAM 1 MG PO TABS
1.0000 mg | ORAL_TABLET | Freq: Every day | ORAL | Status: DC
Start: 2015-08-26 — End: 2015-08-27
  Filled 2015-08-26 (×2): qty 1

## 2015-08-26 MED ORDER — MEGESTROL ACETATE 40 MG PO TABS
40.0000 mg | ORAL_TABLET | Freq: Every day | ORAL | Status: DC
  Administered 2015-08-26: 40 mg via ORAL
  Filled 2015-08-26 (×2): qty 1

## 2015-08-26 MED ORDER — QUETIAPINE FUMARATE ER 50 MG PO TB24
50.0000 mg | ORAL_TABLET | Freq: Every day | ORAL | Status: DC
  Filled 2015-08-26: qty 1

## 2015-08-26 MED ORDER — LORAZEPAM 0.5 MG PO TABS
0.5000 mg | ORAL_TABLET | Freq: Two times a day (BID) | ORAL | Status: DC
  Administered 2015-08-27: 0.5 mg via ORAL
  Filled 2015-08-26: qty 1

## 2015-08-26 MED ORDER — MIRTAZAPINE 15 MG PO TABS
30.0000 mg | ORAL_TABLET | Freq: Every day | ORAL | Status: DC
  Administered 2015-08-26: 30 mg via ORAL

## 2015-08-26 MED ORDER — QUETIAPINE FUMARATE ER 200 MG PO TB24
200.0000 mg | ORAL_TABLET | Freq: Every day | ORAL | Status: DC
  Administered 2015-08-26: 200 mg via ORAL
  Filled 2015-08-26 (×2): qty 1

## 2015-08-26 NOTE — ED Notes (Signed)
NAD noted at this time. Pt repeatedly walks out of room onto hallway to request even medications, explained to patient that medications would be passed when they were due. No respiratory distress noted at this time. Pt alert and anxious but easily redirected at this time.

## 2015-08-26 NOTE — ED Notes (Addendum)
Pt brought in by brother for a behavioral med evaluation.  Father passed away approx 3 weeks ago.  Pt has not been eating for 1 month and has lost approx 15 pounds.  Pt alert, nervous.  Pt states she doesn't want to stay.  Brother is pt's legal guardian.    Denies SI or HI.  Pt dressed out and placed in subwait with brother.

## 2015-08-26 NOTE — ED Provider Notes (Signed)
[facility] Emergency Department Provider Note  ____________________________________________  Time seen: Approximately 850 PM  I have reviewed the triage vital signs and the nursing notes.   HISTORY  Chief Complaint Behavior Problem    HPI [name] [name] is a 54 y.o. female with a history of anorexia who is presenting today with weight loss. The patient says that she has lost about 12 pounds over the past 6 months. She was brought in by her brother for concerns of her behavior. The patient is denying any suicidal or homicidal ideation at this time. However, per the triage note she is actually 15 pounds over the past month and has been very agitated and nervous especially since the death of her father 3 weeks ago. She is denying any hallucinations at this time and is agreeable to talk with tele psychiatrist.   No past medical history on file.  Patient Active Problem List   Diagnosis Date Noted  . Anorexia nervosa 08/05/2015  . Underweight 08/05/2015  . Hypokalemia 08/05/2015  . Depression 08/05/2015  . 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 PALPITATIONS 02/07/2010  . HERPES ZOSTER 02/08/2008  . MIGRAINE HEADACHE 02/08/2008  . 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 IBD 02/08/2008  . CONSTIPATION, CHRONIC 02/08/2008  . ANEMIA, IRON DEFICIENCY 01/25/2008  . CROHN'S DISEASE, LARGE INTESTINE 01/25/2008  . OSTEOARTHRITIS 01/25/2008  . POLYARTHRALGIA 01/25/2008  . OSTEOPOROSIS 01/25/2008    Past Surgical History  Procedure Laterality Date  . Breast biopsy Bilateral   . Colonoscopy  1993    crohn's disease    Current Outpatient Rx  Name  Route  Sig  Dispense  Refill  . LORazepam (ATIVAN) 1 MG tablet   Oral   Take 0.5 mg by mouth 2 (two) times daily.         Marland Kitchen LORazepam (ATIVAN) 1 MG tablet   Oral   Take 1 mg by mouth at bedtime.         . megestrol (MEGACE) 40 MG tablet   Oral   Take 40 mg by mouth at bedtime.          . metoprolol  succinate (TOPROL-XL) 25 MG 24 hr tablet   Oral   Take 1 tablet (25 mg total) by mouth daily.   90 tablet   3   . mirtazapine (REMERON) 15 MG tablet   Oral   Take 15 mg by mouth at bedtime.         . mirtazapine (REMERON) 30 MG tablet   Oral   Take 30 mg by mouth at bedtime.         Marland Kitchen QUEtiapine (SEROQUEL XR) 200 MG 24 hr tablet   Oral   Take 200 mg by mouth at bedtime.         Marland Kitchen QUEtiapine (SEROQUEL XR) 50 MG TB24 24 hr tablet   Oral   Take 50 mg by mouth at bedtime.         . potassium chloride (K-DUR) 10 MEQ tablet   Oral   Take 10 mEq by mouth 2 (two) times daily.           Allergies Acyclovir; Amoxicillin-pot clavulanate; Cefadroxil; Ciprofloxacin; Clarithromycin; Doxycycline; Erythromycin; Levofloxacin; Sulfamethoxazole-trimethoprim; Albumin (human); and Penicillins  No family history on file.  Social History Social History  Substance Use Topics  . Smoking status: Never Smoker   . Smokeless tobacco: None  . Alcohol Use: No    Review of Systems Constitutional: No fever/chills Eyes: No visual changes. ENT:  No sore throat. Cardiovascular: Denies chest pain. Respiratory: Denies shortness of breath. Gastrointestinal: No abdominal pain.  No nausea, no vomiting.  No diarrhea.  No constipation. Genitourinary: Says has burning with urination with some mild pressure in her lower abdomen when urinating.  Musculoskeletal: Negative for back pain. Skin: Negative for rash. Neurological: Negative for headaches, focal weakness or numbness.  10-point ROS otherwise negative.  ____________________________________________   PHYSICAL EXAM:  VITAL SIGNS: ED Triage Vitals  Enc Vitals Group     BP 08/26/15 1650 133/61 mmHg     Pulse Rate 08/26/15 1650 90     Resp 08/26/15 1650 20     Temp 08/26/15 1650 97.9 F (36.6 C)     Temp Source 08/26/15 1650 Oral     SpO2 08/26/15 1650 99 %     Weight 08/26/15 1650 78 lb (35.381 kg)     Height 08/26/15 1650   (1.651 m)     Head Cir --      Peak Flow --      Pain Score 08/26/15 1653 5     Pain Loc --      Pain Edu? --      Excl. in GC? --     Constitutional: Alert and oriented. Well appearing and in no acute distress. Cachectic Eyes: Conjunctivae are normal. PERRL. EOMI. Head: Atraumatic. Nose: No congestion/rhinnorhea. Mouth/Throat: Mucous membranes are moist.  Oropharynx non-erythematous. Neck: No stridor.   Cardiovascular: Normal rate, regular rhythm. Grossly normal heart sounds.  Good peripheral circulation. Respiratory: Normal respiratory effort.  No retractions. Lungs CTAB. Gastrointestinal: Soft with mild suprapubic tenderness. There is no rebound or guarding. There is no tenderness over McBurney's point.. No distention. No abdominal bruits. No CVA tenderness. Musculoskeletal: No lower extremity tenderness nor edema.  No joint effusions. Neurologic:  Normal speech and language. No gross focal neurologic deficits are appreciated. No gait instability. Skin:  Skin is warm, dry and intact. No rash noted. Psychiatric: Mood and affect are normal. Speech and behavior are normal.  ____________________________________________   LABS (all labs ordered are listed, but only abnormal results are displayed)  Labs Reviewed  COMPREHENSIVE METABOLIC PANEL - Abnormal; Notable for the following:    Chloride 100 (*)    Total Bilirubin 0.2 (*)    All other components within normal limits  CBC - Abnormal; Notable for the following:    WBC 3.4 (*)    All other components within normal limits  ACETAMINOPHEN LEVEL - Abnormal; Notable for the following:    Acetaminophen (Tylenol), Serum <10 (*)    All other components within normal limits  ETHANOL  SALICYLATE LEVEL  URINE DRUG SCREEN, QUALITATIVE ([facility] ONLY)  CBG MONITORING, ED    ____________________________________________  EKG   ____________________________________________  RADIOLOGY   ____________________________________________   PROCEDURES  ____________________________________________   INITIAL IMPRESSION / ASSESSMENT AND PLAN / ED COURSE  Pertinent labs & imaging results that were available during my care of the patient were reviewed by me and considered in my medical decision making (see chart for details).  Patient required Ativan in order to calm her when she was first brought back to our quad area. Agreeable to stay in order to see the psychiatrist. ____________________________________________   FINAL CLINICAL IMPRESSION(S) / ED DIAGNOSES  Anxiety. Anorexia nervosa.    Myrna Blazer, MD 08/26/15 [phone]

## 2015-08-26 NOTE — ED Notes (Signed)
NAD noted at this time. Pt ambulatory to the bathroom with no assistance. Respirations even and unlabored at this time. Will continue with 15 min safety checks at this time.

## 2015-08-26 NOTE — ED Notes (Signed)

## 2015-08-26 NOTE — ED Notes (Signed)
Patient assigned to appropriate care area. Patient oriented to unit/care area: Informed that, for their safety, care areas are designed for safety and monitored by security cameras at all times; and visiting hours explained to patient. Patient verbalizes understanding, and verbal contract for safety obtained. 

## 2015-08-26 NOTE — ED Notes (Signed)
Pt noted to be very anxious, cannot be in room, consistently pacing back and forth and in and out of her room. PT states that she is anxious at this time. Pt appears to be nervous, unable to stand still, and noted to be shaky at certain points with this nurse. Will notify MD with request for medications.

## 2015-08-26 NOTE — BH Assessment (Signed)
Assessment Note  [name] [name] is a 54 y.o. female presenting to the ED voluntarily, via her brother, because she has not eaten for 1 month anf has lost 15 lbs.  Pt reports that her father passed away 3 weeks ago and that her brother moved in with her.  She states that her brother has three dogs in the house and she does not like eating around animals.  Pt states that her brother refuses to keep his pets outside.    Pt refuses to eat food in which she thinks has been contaminated and will only eat food that has been pre-packaged.    Pt denies SI/HI as well A/V hallucinations.  Pt reports no history of drug.alcohol abuse.    Diagnosis: Depression  Past Medical History: No past medical history on file.  Past Surgical History  Procedure Laterality Date  . Breast biopsy Bilateral   . Colonoscopy  1993    crohn's disease    Family History: No family history on file.  Social History:  reports that she has never smoked. She does not have any smokeless tobacco history on file. She reports that she does not drink alcohol or use illicit drugs.  Additional Social History:  Alcohol / Drug Use History of alcohol / drug use?: No history of alcohol / drug abuse  CIWA: CIWA-Ar BP: 133/61 mmHg Pulse Rate: 90 COWS:    Allergies:  Allergies  Allergen Reactions  . Acyclovir     Other reaction(s): Other (See Comments) Orally, triggers a migraine  . Amoxicillin-Pot Clavulanate     REACTION: GI Upset  . Cefadroxil     Other reaction(s): Unknown  . Ciprofloxacin     REACTION: GI Upset.Also avelox and floxin  . Clarithromycin     REACTION: GI Upset  . Doxycycline     Other reaction(s): Unknown  . Erythromycin   . Levofloxacin   . Sulfamethoxazole-Trimethoprim     REACTION: itching  . Albumin (Human) Nausea And Vomiting  . Penicillins Nausea Only    Home Medications:  (Not in a hospital admission)  OB/GYN Status:  No LMP recorded. Patient is postmenopausal.  General Assessment  Data Location of Assessment: [facility] ED TTS Assessment: In system Is this a Tele or Face-to-Face Assessment?: Face-to-Face Is this an Initial Assessment or a Re-assessment for this encounter?: Initial Assessment Marital status: Single Maiden name: Cressy Is patient pregnant?: No Pregnancy Status: No Living Arrangements: Other relatives Can pt return to current living arrangement?: Yes Admission Status: Voluntary Is patient capable of signing voluntary admission?: Yes Referral Source: Self/Family/Friend Insurance type: Medicare  Medical Screening Exam [facility] Walk-in ONLY) Medical Exam completed: Yes  Crisis Care Plan Living Arrangements: Other relatives Name of Psychiatrist: Hernendez Name of Therapist: Hernendez  Education Status Is patient currently in school?: No Current Grade: N/A Highest grade of school patient has completed: 12 Name of school: N/A Contact person: N/A  Risk to self with the past 6 months Suicidal Ideation: No Has patient been a risk to self within the past 6 months prior to admission? : Other (comment) Suicidal Intent: No Has patient had any suicidal intent within the past 6 months prior to admission? : No Is patient at risk for suicide?: No Suicidal Plan?: No Has patient had any suicidal plan within the past 6 months prior to admission? : No Access to Means: No What has been your use of drugs/alcohol within the last 12 months?: N/A Previous Attempts/Gestures: Yes How many times?: 1 Other Self Harm  Risks: Pt refuses to eat Triggers for Past Attempts: None known Intentional Self Injurious Behavior: None Family Suicide History: No Recent stressful life event(s): Loss (Comment) (Pt's father passed away 3 weeks ago.) Persecutory voices/beliefs?: No Depression: Yes Depression Symptoms: Loss of interest in usual pleasures, Feeling worthless/self pity Substance abuse history and/or treatment for substance abuse?: No Suicide prevention information given  to non-admitted patients: Not applicable  Risk to Others within the past 6 months Homicidal Ideation: No Does patient have any lifetime risk of violence toward others beyond the six months prior to admission? : No Thoughts of Harm to Others: No Current Homicidal Intent: No Current Homicidal Plan: No Access to Homicidal Means: No Identified Victim: N/A History of harm to others?: No Assessment of Violence: None Noted Violent Behavior Description: N/A Does patient have access to weapons?: No Criminal Charges Pending?: No Does patient have a court date: No Is patient on probation?: No  Psychosis Hallucinations: None noted Delusions: None noted  Mental Status Report Appearance/Hygiene: In scrubs Eye Contact: Good Motor Activity: Unremarkable Speech: Soft Level of Consciousness: Quiet/awake Mood: Anxious, Sad Affect: Anxious, Sad Anxiety Level: Moderate Thought Processes: Circumstantial Judgement: Partial Orientation: Person, Place, Time, Situation Obsessive Compulsive Thoughts/Behaviors: Minimal  Cognitive Functioning Concentration: Normal Memory: Recent Intact IQ: Average Insight: Fair Impulse Control: Fair Appetite: Poor Weight Loss: 15 Weight Gain: 0 Sleep: No Change Total Hours of Sleep: 6 Vegetative Symptoms: None  ADLScreening [facility] Assessment Services) Patient's cognitive ability adequate to safely complete daily activities?: Yes Patient able to express need for assistance with ADLs?: Yes Independently performs ADLs?: Yes (appropriate for developmental age)  Prior Inpatient Therapy Prior Inpatient Therapy: Yes Prior Therapy Facilty/Provider(s): [facility] Reason for Treatment: suicidal  Prior Outpatient Therapy Prior Outpatient Therapy: No Prior Therapy Dates: N/A Prior Therapy Facilty/Provider(s): N/A Reason for Treatment: N/A Does patient have an ACCT team?: No Does patient have Intensive In-House Services?  : No Does patient have [facility] services? :  No Does patient have P4CC services?: No  ADL Screening (condition at time of admission) Patient's cognitive ability adequate to safely complete daily activities?: Yes Patient able to express need for assistance with ADLs?: Yes Independently performs ADLs?: Yes (appropriate for developmental age)       Abuse/Neglect Assessment (Assessment to be complete while patient is alone) Physical Abuse: Denies Verbal Abuse: Denies Sexual Abuse: Denies Exploitation of patient/patient's resources: Denies Self-Neglect: Denies Values / Beliefs Cultural Requests During Hospitalization: None Spiritual Requests During Hospitalization: None Consults Spiritual Care Consult Needed: No Social Work Consult Needed: No Merchant navy officer (For Healthcare) Does patient have an advance directive?: No    Additional Information 1:1 In Past 12 Months?: No CIRT Risk: No Elopement Risk: No     Disposition:  Disposition Initial Assessment Completed for this Encounter: Yes Disposition of Patient: Other dispositions Other disposition(s): Other (Comment) (Psych MD consult)  On Site Evaluation by:   Reviewed with Physician:    Manus Rudd Aubrynn Katona 08/26/2015 9:15 PM
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
## 2015-08-26 NOTE — ED Notes (Signed)
Pt resting in bed at this time. Pt requests more warm blankets. Pt wrapped up with lights on, and TV off. No distress noted at this time.

## 2015-08-26 NOTE — ED Notes (Signed)
Pt transported to the BHU without any difficulty.  

## 2015-08-26 NOTE — ED Notes (Signed)
BEHAVIORAL HEALTH ROUNDING Patient sleeping: No. Patient alert and oriented: yes Behavior appropriate: Yes.  ; If no, describe:  Nutrition and fluids offered: Yes Toileting and hygiene offered: Yes  Sitter present: yes Law enforcement present: Yes [employer]  

## 2015-08-26 NOTE — ED Notes (Signed)
BEHAVIORAL HEALTH ROUNDING Patient sleeping: No. Patient alert and oriented: yes Behavior appropriate: No.; If no, describe: Pt pacing around in room, appears very anxious at this time, appears agitated at time but is easily redirected by staff. Nutrition and fluids offered: Yes  Toileting and hygiene offered: Yes  Sitter present: yes Law enforcement present: Yes ODS

## 2015-08-27 DIAGNOSIS — F5000 Anorexia nervosa, unspecified: Secondary | ICD-10-CM | POA: Diagnosis not present

## 2015-08-27 DIAGNOSIS — F209 Schizophrenia, unspecified: Secondary | ICD-10-CM

## 2015-08-27 MED ORDER — LORAZEPAM 1 MG PO TABS
1.0000 mg | ORAL_TABLET | Freq: Once | ORAL | Status: AC
  Administered 2015-08-27: 1 mg via ORAL

## 2015-08-27 NOTE — ED Notes (Signed)
BEHAVIORAL HEALTH ROUNDING  Patient sleeping: Yes.  Patient alert and oriented: Pt is sleeping.  Behavior appropriate: Yes.  Nutrition and fluids offered: No  Toileting and hygiene offered: No  Sitter present: yes  Law enforcement present: Yes

## 2015-08-27 NOTE — ED Provider Notes (Signed)
-----------------------------------------   3:00 PM on 08/27/2015 -----------------------------------------   BP 95/51 mmHg  Pulse 99  Temp(Src) 97.4 F (36.3 C) (Oral)  Resp 18  Ht  (1.651 m)  Wt 78 lb (35.381 kg)  BMI 12.98 kg/m2  SpO2 99%  I spoke in person with Dr. [name] who evaluated the patient and feels that the patient is safe to be discharged with outpatient follow-up.  He explained that although the patient is clearly cachectic and not obtaining sufficient nutrition, she does not meet inpatient commitment criteria and is not endorsing any suicidality or homicidality.  She has spent an extended period of time inpatient previously and Dr. [name] feels she would not receive any additional benefit from more inpatient treatment, particularly against the patient's will.  He has revoked the involuntary commitment and is recommending discharge and outpatient follow-up at [facility].  The patient is hemodynamically stable and appropriate to go at this time.   Loleta Rose, MD 08/27/15 [phone]

## 2015-08-27 NOTE — ED Notes (Signed)
Attempted to call brother, message left.

## 2015-08-27 NOTE — ED Notes (Addendum)
Pt did not eat breakfast, asking for "prepackaged snack or cookie"; pt informed she needs to eat what is served to her. Pt requesting to use phone.  Pt brushing teeth in bathroom.   BEHAVIORAL HEALTH ROUNDING Patient sleeping: No. Patient alert and oriented: yes Behavior appropriate: Yes.  ; If no, describe:  Nutrition and fluids offered: Yes  Toileting and hygiene offered: Yes  Sitter present: yes Law enforcement present: Yes

## 2015-08-27 NOTE — Consult Note (Signed)
[facility] Consult   Reason for Consult:  Consult for this 54 year old woman with a history of anorexia and possible history of schizophrenia who was brought back to the hospital by her brother because of concerns about weight loss Referring Physician:  Padochowski Patient Identification: LANAE FEDERER MRN:  [id number] Principal Diagnosis: Anorexia nervosa Diagnosis:   Patient Active Problem List   Diagnosis Date Noted  . Schizophrenia ([place]) [F20.9] 08/27/2015  . Anorexia nervosa [F50.00] 08/05/2015  . Underweight [R63.6] 08/05/2015  . Hypokalemia [E87.6] 08/05/2015  . Depression [F32.9] 08/05/2015  . 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 PALPITATIONS [R00.2] 02/07/2010  . HERPES ZOSTER [B02.9] 02/08/2008  . MIGRAINE HEADACHE [G43.909] 02/08/2008  . 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 IBD [K52.89] 02/08/2008  . CONSTIPATION, CHRONIC [K59.09] 02/08/2008  . ANEMIA, IRON DEFICIENCY [D50.9] 01/25/2008  . 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 OSTEOARTHRITIS [M19.90] 01/25/2008  . POLYARTHRALGIA [M25.50] 01/25/2008  . OSTEOPOROSIS [M81.0] 01/25/2008    Total Time spent with patient: 1 hour  Subjective:   ELISSA GRIESHOP is a 54 y.o. female patient admitted with "it just hasn't been the same since my brother moved in".  HPI:  Consult for this 54 year old woman who has a history of anorexia nervosa diagnosis possible schizophrenia. She was brought to the emergency room voluntarily by her brother. Concerns expressed were that she continues to be very underweight and is reportedly not eating well and still losing weight. There is no report that she has acutely expressed suicidal ideation. Patient admits that she has a very low weight and that she has not been eating well. She complains that she dislikes the food made by her brother and also that she feels disgusted by his having dogs in the house. Patient denies that her mood has  been particularly sad or depressed. She says that she sleeps adequately with her current medicine. Patient absolutely denies any wish to die or any suicidal ideation. She denies that she's been having any auditory or visual hallucinations. She states that she does get up and do some activities around the house during the day. She admits that she has been under a lot of stress since her father passed away a couple months ago. Evidently after her father died her brother moved into the house which has been upsetting to her. Patient does not report any other specific stress. She claims that she has gone to [place] and has taken her prescribed medicine. She denies knowing of any new medical problems.  Past psychiatric history: Patient is known to [place] from previous psychiatric hospitalizations. Has had long hospital stays related to her low weight and poor eating habits. Diagnosis was unclear one point and there was uncertainty as to whether she had a primary psychotic or depressive disorder. Patient is currently admitting that she does not eat well but has justifications for it. She denies any suicidal ideation. She is able to express an understanding that continued starvation is likely to lead to death. Patient has responded partially to medication in the past. She is continuing to work with [facility] who are prescribing medication for her including antipsychotics. She tells me that they have referred her to anorexia programs. Patient does have a past history of a suicide attempt.  Social history: Never married. No children. Lived with her father for years. Father recently passed away. Her brother is now living with her. Patient has a past history of brief work but by and large lived at home for  years.  Medical history: History of Crohn's disease. History of chronic poor weight. Multiple other problems at various times including anemia chronic constipation or probably related to her poor eating habits.  Family history:  Patient denies knowing of any family history except that she thinks that maybe her father was a little depressed. Denies any family history of suicidal behavior.  Substance abuse history: Does not drink does not use any abusable drugs. No past history of alcohol or drug abuse.  Current medications: Seroquel 200 mg at night , potassium 10 mEq per day, Remeron 15 mg at night metoprolol 25 mg a day although she says she is no longer taking it because it has led to some low blood pressures. Ativan when necessary  Past Psychiatric History: Past history of lengthy hospitalizations with diagnoses variously of depression and anorexia and schizophrenia. Past history of suicide attempts.  Risk to Self: Suicidal Ideation: No Suicidal Intent: No Is patient at risk for suicide?: No Suicidal Plan?: No Access to Means: No What has been your use of drugs/alcohol within the last 12 months?: N/A How many times?: 1 Other Self Harm Risks: Pt refuses to eat Triggers for Past Attempts: None known Intentional Self Injurious Behavior: None Risk to Others: Homicidal Ideation: No Thoughts of Harm to Others: No Current Homicidal Intent: No Current Homicidal Plan: No Access to Homicidal Means: No Identified Victim: N/A History of harm to others?: No Assessment of Violence: None Noted Violent Behavior Description: N/A Does patient have access to weapons?: No Criminal Charges Pending?: No Does patient have a court date: No Prior Inpatient Therapy: Prior Inpatient Therapy: Yes Prior Therapy Facilty/Provider(s): [facility] Reason for Treatment: suicidal Prior Outpatient Therapy: Prior Outpatient Therapy: No Prior Therapy Dates: N/A Prior Therapy Facilty/Provider(s): N/A Reason for Treatment: N/A Does patient have an ACCT team?: No Does patient have Intensive In-House Services?  : No Does patient have [facility] services? : No Does patient have P4CC services?: No  Past Medical History: No past medical history on  file.  Past Surgical History  Procedure Laterality Date  . Breast biopsy Bilateral   . Colonoscopy  1993    crohn's disease   Family History: No family history on file. Family Psychiatric  History: Patient thinks her father may have had depression but is somewhat vague about it no other clear family history reported. Social History:  History  Alcohol Use No     History  Drug Use No    Social History   Social History  . Marital Status: Single    Spouse Name: N/A  . Number of Children: N/A  . Years of Education: N/A   Social History Main Topics  . Smoking status: Never Smoker   . Smokeless tobacco: None  . Alcohol Use: No  . Drug Use: No  . Sexual Activity: Not Asked   Other Topics Concern  . None   Social History Narrative   Additional Social History:    History of alcohol / drug use?: No history of alcohol / drug abuse                     Allergies:   Allergies  Allergen Reactions  . Acyclovir     Other reaction(s): Other (See Comments) Orally, triggers a migraine  . Amoxicillin-Pot Clavulanate     REACTION: GI Upset  . Cefadroxil     Other reaction(s): Unknown  . Ciprofloxacin     REACTION: GI Upset.Also avelox and floxin  . Clarithromycin  REACTION: GI Upset  . Doxycycline     Other reaction(s): Unknown  . Erythromycin   . Levofloxacin   . Sulfamethoxazole-Trimethoprim     REACTION: itching  . Albumin (Human) Nausea And Vomiting  . Penicillins Nausea Only    Labs:  Results for orders placed or performed during the hospital encounter of 08/26/15 (from the past 48 hour(s))  Comprehensive metabolic panel     Status: Abnormal   Collection Time: 08/26/15  5:02 PM  Result Value Ref Range   Sodium 135 135 - 145 mmol/L   Potassium 3.7 3.5 - 5.1 mmol/L   Chloride 100 (L) 101 - 111 mmol/L   CO2 28 22 - 32 mmol/L   Glucose, Bld 97 65 - 99 mg/dL   BUN 7 6 - 20 mg/dL   Creatinine, Ser 0.52 0.44 - 1.00 mg/dL   Calcium 10.1 8.9 - 10.3 mg/dL    Total Protein 7.3 6.5 - 8.1 g/dL   Albumin 4.8 3.5 - 5.0 g/dL   AST 23 15 - 41 U/L   ALT 21 14 - 54 U/L   Alkaline Phosphatase 53 38 - 126 U/L   Total Bilirubin 0.2 (L) 0.3 - 1.2 mg/dL   GFR calc non Af Amer >60 >60 mL/min   GFR calc Af Amer >60 >60 mL/min    Comment: (NOTE) The eGFR has been calculated using the CKD EPI equation. This calculation has not been validated in all clinical situations. eGFR's persistently <60 mL/min signify possible Chronic Kidney Disease.    Anion gap 7 5 - 15  CBC     Status: Abnormal   Collection Time: 08/26/15  5:02 PM  Result Value Ref Range   WBC 3.4 (L) 3.6 - 11.0 K/uL   RBC 4.11 3.80 - 5.20 MIL/uL   Hemoglobin 13.1 12.0 - 16.0 g/dL   HCT 39.1 35.0 - 47.0 %   MCV 95.0 80.0 - 100.0 fL   MCH 31.9 26.0 - 34.0 pg   MCHC 33.5 32.0 - 36.0 g/dL   RDW 13.8 11.5 - 14.5 %   Platelets 215 150 - 440 K/uL  Ethanol (ETOH)     Status: None   Collection Time: 08/26/15  5:02 PM  Result Value Ref Range   Alcohol, Ethyl (B) <5 <5 mg/dL    Comment:        LOWEST DETECTABLE LIMIT FOR SERUM ALCOHOL IS 5 mg/dL FOR MEDICAL PURPOSES ONLY   Salicylate level     Status: None   Collection Time: 08/26/15  5:02 PM  Result Value Ref Range   Salicylate Lvl <2.4 2.8 - 30.0 mg/dL  Acetaminophen level     Status: Abnormal   Collection Time: 08/26/15  5:02 PM  Result Value Ref Range   Acetaminophen (Tylenol), Serum <10 (L) 10 - 30 ug/mL    Comment:        THERAPEUTIC CONCENTRATIONS VARY SIGNIFICANTLY. A RANGE OF 10-30 ug/mL MAY BE AN EFFECTIVE CONCENTRATION FOR MANY PATIENTS. HOWEVER, SOME ARE BEST TREATED AT CONCENTRATIONS OUTSIDE THIS RANGE. ACETAMINOPHEN CONCENTRATIONS >150 ug/mL AT 4 HOURS AFTER INGESTION AND >50 ug/mL AT 12 HOURS AFTER INGESTION ARE OFTEN ASSOCIATED WITH TOXIC REACTIONS.   Urine Drug Screen, Qualitative ([facility] only)     Status: None   Collection Time: 08/26/15 10:33 PM  Result Value Ref Range   Tricyclic, Ur Screen NONE DETECTED  NONE DETECTED   Amphetamines, Ur Screen NONE DETECTED NONE DETECTED   MDMA (Ecstasy)Ur Screen NONE DETECTED NONE DETECTED   Cocaine  Metabolite,Ur [place] NONE DETECTED NONE DETECTED   Opiate, Ur Screen NONE DETECTED NONE DETECTED   Phencyclidine (PCP) Ur S NONE DETECTED NONE DETECTED   Cannabinoid 50 Ng, Ur [place] NONE DETECTED NONE DETECTED   Barbiturates, Ur Screen NONE DETECTED NONE DETECTED   Benzodiazepine, Ur Scrn NONE DETECTED NONE DETECTED   Methadone Scn, Ur NONE DETECTED NONE DETECTED    Comment: (NOTE) 989  Tricyclics, urine               Cutoff 1000 ng/mL 200  Amphetamines, urine             Cutoff 1000 ng/mL 300  MDMA (Ecstasy), urine           Cutoff 500 ng/mL 400  Cocaine Metabolite, urine       Cutoff 300 ng/mL 500  Opiate, urine                   Cutoff 300 ng/mL 600  Phencyclidine (PCP), urine      Cutoff 25 ng/mL 700  Cannabinoid, urine              Cutoff 50 ng/mL 800  Barbiturates, urine             Cutoff 200 ng/mL 900  Benzodiazepine, urine           Cutoff 200 ng/mL 1000 Methadone, urine                Cutoff 300 ng/mL 1100 1200 The urine drug screen provides only a preliminary, unconfirmed 1300 analytical test result and should not be used for non-medical 1400 purposes. Clinical consideration and professional judgment should 1500 be applied to any positive drug screen result due to possible 1600 interfering substances. A more specific alternate chemical method 1700 must be used in order to obtain a confirmed analytical result.  1800 Gas chromato graphy / mass spectrometry (GC/MS) is the preferred 1900 confirmatory method.     Current Facility-Administered Medications  Medication Dose Route Frequency Provider Last Rate Last Dose  . LORazepam (ATIVAN) tablet 0.5 mg  0.5 mg Oral BID Orbie Pyo, MD   0.5 mg at 08/27/15 1003  . LORazepam (ATIVAN) tablet 1 mg  1 mg Oral QHS Orbie Pyo, MD   1 mg at 08/26/15 2221  . megestrol (MEGACE) tablet 40  mg  40 mg Oral QHS Orbie Pyo, MD   40 mg at 08/26/15 2240  . metoprolol succinate (TOPROL-XL) 24 hr tablet 25 mg  25 mg Oral Daily Orbie Pyo, MD   25 mg at 08/27/15 1001  . mirtazapine (REMERON) tablet 30 mg  30 mg Oral QHS Orbie Pyo, MD   30 mg at 08/26/15 2244  . QUEtiapine (SEROQUEL XR) 24 hr tablet 200 mg  200 mg Oral QHS Orbie Pyo, MD   200 mg at 08/26/15 2243   Current Outpatient Prescriptions  Medication Sig Dispense Refill  . LORazepam (ATIVAN) 1 MG tablet Take 0.5 mg by mouth 2 (two) times daily.    Marland Kitchen LORazepam (ATIVAN) 1 MG tablet Take 1 mg by mouth at bedtime.    . megestrol (MEGACE) 40 MG tablet Take 40 mg by mouth at bedtime.     . metoprolol succinate (TOPROL-XL) 25 MG 24 hr tablet Take 1 tablet (25 mg total) by mouth daily. 90 tablet 3  . mirtazapine (REMERON) 15 MG tablet Take 15 mg by mouth at bedtime.    . mirtazapine (REMERON) 30 MG  tablet Take 30 mg by mouth at bedtime.    Marland Kitchen QUEtiapine (SEROQUEL XR) 200 MG 24 hr tablet Take 200 mg by mouth at bedtime.    Marland Kitchen QUEtiapine (SEROQUEL XR) 50 MG TB24 24 hr tablet Take 50 mg by mouth at bedtime.    . potassium chloride (K-DUR) 10 MEQ tablet Take 10 mEq by mouth 2 (two) times daily.      Musculoskeletal: Strength & Muscle Tone: decreased Gait & Station: normal Patient leans: N/A  Psychiatric Specialty Exam: Review of Systems  Constitutional: Positive for weight loss.  HENT: Negative.   Eyes: Negative.   Respiratory: Negative.   Cardiovascular: Negative.   Gastrointestinal: Negative.   Musculoskeletal: Negative.   Skin: Negative.   Neurological: Negative.   Psychiatric/Behavioral: Negative for depression, suicidal ideas, hallucinations, memory loss and substance abuse. The patient is nervous/anxious. The patient does not have insomnia.     Blood pressure 95/51, pulse 99, temperature 97.4 F (36.3 C), temperature source Oral, resp. rate 18, height $RemoveBe'5\' 5"'EchkwCUUI$  (1.651 m),  weight 35.381 kg (78 lb), SpO2 99 %.Body mass index is 12.98 kg/(m^2).  General Appearance: Casual  Eye Contact::  Fair  Speech:  Slow  Volume:  Normal  Mood:  Euthymic  Affect:  Flat  Thought Process:  Linear  Orientation:  Full (Time, Place, and Person)  Thought Content:  Negative  Suicidal Thoughts:  No  Homicidal Thoughts:  No  Memory:  Immediate;   Fair Recent;   Fair Remote;   Fair  Judgement:  Impaired  Insight:  Fair  Psychomotor Activity:  Decreased  Concentration:  Fair  Recall:  [employer] of Knowledge:Poor  Language: Fair  Akathisia:  No  Handed:  Right  AIMS (if indicated):     Assets:  Communication Skills Desire for Improvement Housing Resilience Social Support  ADL's:  Intact  Cognition: WNL  Sleep:      Treatment Plan Summary: Plan Patient interviewed. Old chart reviewed. Current labs reviewed. Vital signs reviewed. Case discussed with emergency room physician and psychiatry staff in the emergency room. Although the brother has now filed commitment paperwork I do not believe that the patient currently meets commitment criteria. She has chronic anorexia but does not appear to be acutely psychotic. She is denying being depressed. She denies any wish to harm herself and is able to acknowledge the dangers of continued starvation. Patient is able to articulate a positive plan to continue engaging in outpatient treatment through [facility]. Counseling completed. Emphasized with patient the risks of dying if she continues to eat this poorly. At this point I don't think as I said she meets commitment criteria and I don't think we are likely to be able to treat her anorexia here in our hospital. I'm told that [facility] is working on referral to appropriate anorexia facilities. I will not continue her current commitment papers. Patient can be discharged with follow-up as usual at [facility].  Disposition: Patient does not meet criteria for psychiatric inpatient admission. Discussed crisis  plan, support from social network, calling 911, coming to the Emergency Department, and calling Suicide Hotline.  John Clapacs 08/27/2015 3:05 PM

## 2015-08-27 NOTE — ED Notes (Addendum)
Psych MD at bedside

## 2015-08-27 NOTE — ED Notes (Addendum)
BEHAVIORAL HEALTH ROUNDING Patient sleeping: No. Patient alert and oriented: yes Behavior appropriate: Yes.  ; If no, describe:  Nutrition and fluids offered: Yes  Toileting and hygiene offered: Yes  Sitter present: yes Law enforcement present: Yes  

## 2015-08-27 NOTE — ED Notes (Signed)
BEHAVIORAL HEALTH ROUNDING Patient sleeping: No. Patient alert and oriented: yes Behavior appropriate: Yes.  ; If no, describe:  Nutrition and fluids offered: Yes  Toileting and hygiene offered: Yes  Sitter present: yes Law enforcement present: Yes  

## 2015-08-27 NOTE — Discharge Instructions (Signed)
You have been seen in the Emergency Department (ED) today for a psychiatric complaint.  You have been evaluated by psychiatry and we believe you are safe to be discharged from the hospital.  Although you are clearly malnourished, you do not meet criteria for involuntary commitment at this time.  The psychiatrist recommends that you continue to go to [facility] and that you schedule the next available appointment for follow up.  Please return to the ED immediately if you have ANY thoughts of hurting yourself or anyone else, so that we may help you.  Please avoid alcohol and drug use.  Follow up with your doctor and/or therapist as soon as possible regarding today's ED visit.   Please follow up any other recommendations and clinic appointments provided by the psychiatry team that saw you in the Emergency Department.   Malnutrition Malnutrition is any condition in which nutrition is poor. There are many forms of malnutrition. A common form is having too little of one kind of nutrient (nutritional deficiency). Nutrients include proteins, minerals, carbohydrates, fats, and vitamins. They provide the body with energy and keep the body working normally. Malnutrition ranges from mild to severe. The condition affects the body's defense system (immune system). Because of this, people who are malnourished are more likely to develop health problems and get sick. CAUSES Causes of malnutrition include:  Eating an unbalanced diet.  Eating too much of certain foods.  Eating too little.  Conditions that decrease the body's ability to use nutrients. RISK FACTORS Risk factors include:  Pregnancy and lactation. Women who are pregnant may become malnourished if they do not increase their nutrient intake. They are also susceptible to folic acid deficiency.  Increasing age. The body's ability to absorb nutrients decreases with age. This can contribute to iron, calcium, and vitamin D deficiencies.  Alcohol or drug  dependency. Addiction often leads to a lifestyle in which proper nourishment is ignored. Dependency can also hurt the metabolism and the body's ability to absorb nutrients. Alcoholism is a major cause of thiamine deficiency and can lead to deficiencies of magnesium, zinc, and other vitamins.  Eating disorders, such as anorexia nervosa. People with these disorders may eat too little or too much.  Chewing or swallowing problems. People with these disorders may not eat enough.  Certain diseases, including:  Long-lasting (chronic) diseases. Chronic diseases tend to affect the absorption of calcium, iron, and vitamins B12, A, D, E, and K.  Liver disease. Liver disease affects the storage of vitamins A and B12. It also interferes with the metabolism of protein and energy sources.  Kidney disease. Kidney disease may cause deficiencies of protein, iron, and vitamin D.  Cancer or AIDS. These diseases can cause a loss of appetite.  Cystic fibrosis. This disease can make it difficult for the body to absorb nutrients.  Certain diets, including.  The vegetarian diet. Vegetarians are at risk for iron deficiency.  The vegan diet. Vegans are susceptible to vitamin B12, calcium, iron, vitamin D, and zinc deficiencies.  The fruitarian diet. This diet can be deficient in protein, sodium, and many micronutrients.  Many commercial "fad" diets, including those that claim to enhance well-being and reduce weight.  Very low calorie diets.  Low income. People with a low income may have trouble paying for nutritious foods. SIGNS AND SYMPTOMS Signs and symptoms depend on the kind of malnutrition you have. Common symptoms include:  Fatigue.  Weakness.  Dizziness.  Fainting  Weight loss.  Poor immune response.  Lack of  menstruation.  Hair loss.  Poor memory. DIAGNOSIS  Malnutrition may be diagnosed by:  A medical history.  A dietary history.  A physical exam. This may include a  measurement of your body mass index (BMI).  Blood tests. TREATMENT  Treatments vary depending on the cause of the malnutrition. Common treatments include:  Dietary changes.  Dietary supplements, such as vitamins and minerals.  Treatment of any underlying conditions. HOME CARE INSTRUCTIONS  Eat a balanced diet.  Take dietary supplements as directed by your health care provider.  Exercise regularly. Exercising can improve appetite.  Keep all follow-up visits as directed by your health care provider. This is important. PREVENTION Eating a well-balanced diet helps to prevent most forms of malnutrition. SEEK MEDICAL CARE IF:  You have increased weakness or fatigue.  You faint.  You stop menstruating.  You have rapid hair loss.  You have unexpected weight loss.   This information is not intended to replace advice given to you by your health care provider. Make sure you discuss any questions you have with your health care provider.   Document Released: 09/16/2005 Document Revised: 11/21/2014 Document Reviewed: 06/27/2014 Elsevier Interactive Patient Education 2016 [employer] Inc[employer]  Major Depressive Disorder Major depressive disorder is a mental illness. It also may be called clinical depression or unipolar depression. Major depressive disorder usually causes feelings of sadness, hopelessness, or helplessness. Some people with this disorder do not feel particularly sad but lose interest in doing things they used to enjoy (anhedonia). Major depressive disorder also can cause physical symptoms. It can interfere with work, school, relationships, and other normal everyday activities. The disorder varies in severity but is longer lasting and more serious than the sadness we all feel from time to time in our lives. Major depressive disorder often is triggered by stressful life events or major life changes. Examples of these triggers include divorce, loss of your job or home, a move, and the  death of a family member or close friend. Sometimes this disorder occurs for no obvious reason at all. People who have family members with major depressive disorder or bipolar disorder are at higher risk for developing this disorder, with or without life stressors. Major depressive disorder can occur at any age. It may occur just once in your life (single episode major depressive disorder). It may occur multiple times (recurrent major depressive disorder). SYMPTOMS People with major depressive disorder have either anhedonia or depressed mood on nearly a daily basis for at least 2 weeks or longer. Symptoms of depressed mood include:  Feelings of sadness (blue or down in the dumps) or emptiness.  Feelings of hopelessness or helplessness.  Tearfulness or episodes of crying (may be observed by others).  Irritability (children and adolescents). In addition to depressed mood or anhedonia or both, people with this disorder have at least four of the following symptoms:  Difficulty sleeping or sleeping too much.   Significant change (increase or decrease) in appetite or weight.   Lack of energy or motivation.  Feelings of guilt and worthlessness.   Difficulty concentrating, remembering, or making decisions.  Unusually slow movement (psychomotor retardation) or restlessness (as observed by others).   Recurrent wishes for death, recurrent thoughts of self-harm (suicide), or a suicide attempt. People with major depressive disorder commonly have persistent negative thoughts about themselves, other people, and the world. People with severe major depressive disorder may experiencedistorted beliefs or perceptions about the world (psychotic delusions). They also may see or hear things that are not real (  psychotic hallucinations). DIAGNOSIS Major depressive disorder is diagnosed through an assessment by your health care provider. Your health care provider will ask aboutaspects of your daily life,  such as mood,sleep, and appetite, to see if you have the diagnostic symptoms of major depressive disorder. Your health care provider may ask about your medical history and use of alcohol or drugs, including prescription medicines. Your health care provider also may do a physical exam and blood work. This is because certain medical conditions and the use of certain substances can cause major depressive disorder-like symptoms (secondary depression). Your health care provider also may refer you to a mental health specialist for further evaluation and treatment. TREATMENT It is important to recognize the symptoms of major depressive disorder and seek treatment. The following treatments can be prescribed for this disorder:   Medicine. Antidepressant medicines usually are prescribed. Antidepressant medicines are thought to correct chemical imbalances in the brain that are commonly associated with major depressive disorder. Other types of medicine may be added if the symptoms do not respond to antidepressant medicines alone or if psychotic delusions or hallucinations occur.  Talk therapy. Talk therapy can be helpful in treating major depressive disorder by providing support, education, and guidance. Certain types of talk therapy also can help with negative thinking (cognitive behavioral therapy) and with relationship issues that trigger this disorder (interpersonal therapy). A mental health specialist can help determine which treatment is best for you. Most people with major depressive disorder do well with a combination of medicine and talk therapy. Treatments involving electrical stimulation of the brain can be used in situations with extremely severe symptoms or when medicine and talk therapy do not work over time. These treatments include electroconvulsive therapy, transcranial magnetic stimulation, and vagal nerve stimulation.   This information is not intended to replace advice given to you by your health  care provider. Make sure you discuss any questions you have with your health care provider.   Document Released: 02/25/2013 Document Revised: 11/21/2014 Document Reviewed: 02/25/2013 Elsevier Interactive Patient Education [employer]

## 2015-08-27 NOTE — ED Notes (Signed)
BEHAVIORAL HEALTH ROUNDING Patient sleeping: No. Patient alert and oriented: yes Behavior appropriate: Yes.  ;  Nutrition and fluids offered: Yes  Toileting and hygiene offered: Yes  Sitter present: yes Law enforcement present: Yes  

## 2015-08-27 NOTE — ED Notes (Addendum)
BEHAVIORAL HEALTH ROUNDING Patient sleeping: Yes.   Patient alert and oriented: not applicable Behavior appropriate: Yes.  ; If no, describe:  Nutrition and fluids offered: Yes  Toileting and hygiene offered: Yes  Sitter present: yes Law enforcement present: Yes  ENVIRONMENTAL ASSESSMENT Potentially harmful objects out of patient reach: Yes.   Personal belongings secured: Yes.   Patient dressed in hospital provided attire only: Yes.   Plastic bags out of patient reach: Yes.   Patient care equipment (cords, cables, call bells, lines, and drains) shortened, removed, or accounted for: Yes.   Equipment and supplies removed from bottom of stretcher: Yes.   Potentially toxic materials out of patient reach: Yes.   Sharps container removed or out of patient reach: Yes.    ED BHU PLACEMENT JUSTIFICATION Is the patient under IVC or is there intent for IVC: No. Is the patient medically cleared: Yes.   Is there vacancy in the ED BHU: Yes.   Is the population mix appropriate for patient: Yes.   Is the patient awaiting placement in inpatient or outpatient setting: Yes.   Has the patient had a psychiatric consult: Yes.   Survey of unit performed for contraband, proper placement and condition of furniture, tampering with fixtures in bathroom, shower, and each patient room: Yes.  ; Findings:  APPEARANCE/BEHAVIOR calm and cooperative NEURO ASSESSMENT Orientation: time, place and person Hallucinations: No.None noted (Hallucinations) Speech: Normal Gait: normal RESPIRATORY ASSESSMENT Normal expansion.  Clear to auscultation.  No rales, rhonchi, or wheezing., No chest wall tenderness. CARDIOVASCULAR ASSESSMENT regular rate and rhythm, S1, S2 normal, no murmur, click, rub or gallop GASTROINTESTINAL ASSESSMENT soft, nontender, BS WNL, no r/g EXTREMITIES normal strength, tone, and muscle mass, no deformities PLAN OF CARE Provide calm/safe environment. Vital signs assessed twice daily. ED BHU  Assessment once each 12-hour shift. Collaborate with intake RN daily or as condition indicates. Assure the ED provider has rounded once each shift. Provide and encourage hygiene. Provide redirection as needed. Assess for escalating behavior; address immediately and inform ED provider.  Assess family dynamic and appropriateness for visitation as needed: Yes.  ; If necessary, describe findings:  Educate the patient/family about BHU procedures/visitation: Yes.  ; If necessary, describe findings:

## 2015-08-27 NOTE — ED Notes (Signed)
BEHAVIORAL HEALTH ROUNDING  Patient sleeping: Yes.  Patient alert and oriented: Pt is sleeping.  Behavior appropriate: Yes.  Nutrition and fluids offered: No  Toileting and hygiene offered: No  Sitter present: yes  Law enforcement present: Yes  

## 2015-08-27 NOTE — ED Notes (Signed)
Pt resting quietly in room.  

## 2015-08-27 NOTE — ED Notes (Signed)
Pt's brother in to visit

## 2015-08-27 NOTE — ED Notes (Signed)
BEHAVIORAL HEALTH ROUNDING Patient sleeping: No. Patient alert and oriented: no Behavior appropriate: Yes.  ; If no, describe:  Nutrition and fluids offered: Yes  Toileting and hygiene offered: Yes  Sitter present: yes Law enforcement present: Yes  

## 2015-08-27 NOTE — ED Notes (Signed)
Pt resting quietly in room, even and nonlabored respirations noted. Will continue to monitor on video camera system. Q 15 minute rounds continued.

## 2015-08-27 NOTE — ED Notes (Signed)
Spoke with brother, legal guardian, he is coming to pick up patient.

## 2015-09-09 ENCOUNTER — Emergency Department: Payer: Medicare Other

## 2015-09-09 ENCOUNTER — Emergency Department
Admission: EM | Admit: 2015-09-09 | Discharge: 2015-09-10 | Disposition: A | Discharge status: Other Acute Inpatient Hospital | Payer: Medicare Other | Attending: Emergency Medicine | Admitting: Emergency Medicine

## 2015-09-09 DIAGNOSIS — F5000 Anorexia nervosa, unspecified: Secondary | ICD-10-CM | POA: Diagnosis present

## 2015-09-09 DIAGNOSIS — Y9389 Activity, other specified: Secondary | ICD-10-CM | POA: Insufficient documentation

## 2015-09-09 DIAGNOSIS — Y92009 Unspecified place in unspecified non-institutional (private) residence as the place of occurrence of the external cause: Secondary | ICD-10-CM | POA: Insufficient documentation

## 2015-09-09 DIAGNOSIS — Z79899 Other long term (current) drug therapy: Secondary | ICD-10-CM | POA: Diagnosis not present

## 2015-09-09 DIAGNOSIS — F13929 Sedative, hypnotic or anxiolytic use, unspecified with intoxication, unspecified: Secondary | ICD-10-CM

## 2015-09-09 DIAGNOSIS — F13129 Sedative, hypnotic or anxiolytic abuse with intoxication, unspecified: Secondary | ICD-10-CM | POA: Diagnosis not present

## 2015-09-09 DIAGNOSIS — T424X4A Poisoning by benzodiazepines, undetermined, initial encounter: Secondary | ICD-10-CM | POA: Diagnosis not present

## 2015-09-09 DIAGNOSIS — Y998 Other external cause status: Secondary | ICD-10-CM | POA: Diagnosis not present

## 2015-09-09 DIAGNOSIS — Z880 Allergy status to penicillin: Secondary | ICD-10-CM | POA: Diagnosis not present

## 2015-09-09 DIAGNOSIS — F209 Schizophrenia, unspecified: Secondary | ICD-10-CM | POA: Diagnosis present

## 2015-09-09 DIAGNOSIS — K5909 Other constipation: Secondary | ICD-10-CM | POA: Diagnosis present

## 2015-09-09 MED ORDER — SODIUM CHLORIDE 0.9 % IV BOLUS (SEPSIS)
500.0000 mL | Freq: Once | INTRAVENOUS | Status: AC
  Administered 2015-09-10: 500 mL via INTRAVENOUS

## 2015-09-09 NOTE — ED Notes (Signed)
Pt arrived incontinent of urine; cleaned well; when preparing to perform In & Out cath pt began to void; able to catch specimen; pt placed in dry attends; warm blankets; pads to rails as pt is unaware of her surroundings and at times moving about on bed; sitter to stay with pt for safety;

## 2015-09-09 NOTE — ED Notes (Signed)
Pt arrived via EMS from home responsive only to painful stimuli-flinches but no verbal communication; brief periods of apnea; pt with known prior history of overdose on prescribed medications;

## 2015-09-10 ENCOUNTER — Inpatient Hospital Stay
Admission: EM | Admit: 2015-09-10 | Discharge: 2015-09-21 | DRG: 885 | Disposition: A | Discharge status: Home / Self Care | Payer: Medicare Other | Source: Intra-hospital | Attending: Psychiatry | Admitting: Psychiatry

## 2015-09-10 ENCOUNTER — Encounter: Payer: Self-pay | Admitting: Psychiatry

## 2015-09-10 ENCOUNTER — Emergency Department: Payer: Medicare Other

## 2015-09-10 DIAGNOSIS — R440 Auditory hallucinations: Secondary | ICD-10-CM | POA: Diagnosis present

## 2015-09-10 DIAGNOSIS — N390 Urinary tract infection, site not specified: Secondary | ICD-10-CM | POA: Diagnosis present

## 2015-09-10 DIAGNOSIS — K5900 Constipation, unspecified: Secondary | ICD-10-CM | POA: Diagnosis present

## 2015-09-10 DIAGNOSIS — T6592XA Toxic effect of unspecified substance, intentional self-harm, initial encounter: Secondary | ICD-10-CM | POA: Diagnosis present

## 2015-09-10 DIAGNOSIS — F509 Eating disorder, unspecified: Secondary | ICD-10-CM | POA: Diagnosis present

## 2015-09-10 DIAGNOSIS — E871 Hypo-osmolality and hyponatremia: Secondary | ICD-10-CM | POA: Diagnosis present

## 2015-09-10 DIAGNOSIS — F13929 Sedative, hypnotic or anxiolytic use, unspecified with intoxication, unspecified: Secondary | ICD-10-CM

## 2015-09-10 DIAGNOSIS — F13129 Sedative, hypnotic or anxiolytic abuse with intoxication, unspecified: Secondary | ICD-10-CM | POA: Diagnosis not present

## 2015-09-10 DIAGNOSIS — R45851 Suicidal ideations: Secondary | ICD-10-CM | POA: Diagnosis present

## 2015-09-10 DIAGNOSIS — F333 Major depressive disorder, recurrent, severe with psychotic symptoms: Principal | ICD-10-CM | POA: Diagnosis present

## 2015-09-10 DIAGNOSIS — Z880 Allergy status to penicillin: Secondary | ICD-10-CM

## 2015-09-10 DIAGNOSIS — F419 Anxiety disorder, unspecified: Secondary | ICD-10-CM | POA: Diagnosis present

## 2015-09-10 DIAGNOSIS — K5090 Crohn's disease, unspecified, without complications: Secondary | ICD-10-CM | POA: Diagnosis present

## 2015-09-10 DIAGNOSIS — Z882 Allergy status to sulfonamides status: Secondary | ICD-10-CM | POA: Diagnosis not present

## 2015-09-10 DIAGNOSIS — Z888 Allergy status to other drugs, medicaments and biological substances status: Secondary | ICD-10-CM | POA: Diagnosis not present

## 2015-09-10 DIAGNOSIS — R627 Adult failure to thrive: Secondary | ICD-10-CM | POA: Diagnosis present

## 2015-09-10 DIAGNOSIS — Z9119 Patient's noncompliance with other medical treatment and regimen: Secondary | ICD-10-CM | POA: Diagnosis not present

## 2015-09-10 DIAGNOSIS — F1021 Alcohol dependence, in remission: Secondary | ICD-10-CM | POA: Diagnosis present

## 2015-09-10 DIAGNOSIS — Z681 Body mass index (BMI) 19 or less, adult: Secondary | ICD-10-CM

## 2015-09-10 DIAGNOSIS — Z833 Family history of diabetes mellitus: Secondary | ICD-10-CM

## 2015-09-10 DIAGNOSIS — Z800 Family history of malignant neoplasm of digestive organs: Secondary | ICD-10-CM

## 2015-09-10 DIAGNOSIS — Z9889 Other specified postprocedural states: Secondary | ICD-10-CM

## 2015-09-10 DIAGNOSIS — M810 Age-related osteoporosis without current pathological fracture: Secondary | ICD-10-CM | POA: Diagnosis present

## 2015-09-10 DIAGNOSIS — T424X4A Poisoning by benzodiazepines, undetermined, initial encounter: Secondary | ICD-10-CM | POA: Diagnosis not present

## 2015-09-10 DIAGNOSIS — R0981 Nasal congestion: Secondary | ICD-10-CM | POA: Diagnosis present

## 2015-09-10 DIAGNOSIS — E46 Unspecified protein-calorie malnutrition: Secondary | ICD-10-CM | POA: Diagnosis present

## 2015-09-10 DIAGNOSIS — R251 Tremor, unspecified: Secondary | ICD-10-CM | POA: Diagnosis present

## 2015-09-10 DIAGNOSIS — K5909 Other constipation: Secondary | ICD-10-CM | POA: Diagnosis present

## 2015-09-10 HISTORY — DX: Unspecified protein-calorie malnutrition: E46

## 2015-09-10 HISTORY — DX: Ulcerative colitis, unspecified, without complications: K51.90

## 2015-09-10 HISTORY — DX: Alcohol dependence, in remission: F10.21

## 2015-09-10 HISTORY — DX: Age-related osteoporosis without current pathological fracture: M81.0

## 2015-09-10 LAB — CBC WITH DIFFERENTIAL/PLATELET
BASOS ABS: 0 10*3/uL (ref 0–0.1)
BASOS PCT: 1 %
Eosinophils Absolute: 0.1 10*3/uL (ref 0–0.7)
Eosinophils Relative: 3 %
HEMATOCRIT: 40.8 % (ref 35.0–47.0)
Hemoglobin: 14.1 g/dL (ref 12.0–16.0)
LYMPHS PCT: 42 %
Lymphs Abs: 1.1 10*3/uL (ref 1.0–3.6)
MCH: 32.8 pg (ref 26.0–34.0)
MCHC: 34.5 g/dL (ref 32.0–36.0)
MCV: 95.2 fL (ref 80.0–100.0)
MONO ABS: 0.2 10*3/uL (ref 0.2–0.9)
Monocytes Relative: 9 %
NEUTROS ABS: 1.2 10*3/uL — AB (ref 1.4–6.5)
Neutrophils Relative %: 45 %
PLATELETS: 191 10*3/uL (ref 150–440)
RBC: 4.29 MIL/uL (ref 3.80–5.20)
RDW: 13.5 % (ref 11.5–14.5)
WBC: 2.7 10*3/uL — AB (ref 3.6–11.0)

## 2015-09-10 LAB — URINE DRUG SCREEN, QUALITATIVE (ARMC ONLY)
AMPHETAMINES, UR SCREEN: NOT DETECTED
BENZODIAZEPINE, UR SCRN: POSITIVE — AB
Barbiturates, Ur Screen: NOT DETECTED
Cannabinoid 50 Ng, Ur ~~LOC~~: NOT DETECTED
Cocaine Metabolite,Ur ~~LOC~~: NOT DETECTED
MDMA (ECSTASY) UR SCREEN: NOT DETECTED
METHADONE SCREEN, URINE: NOT DETECTED
OPIATE, UR SCREEN: NOT DETECTED
Phencyclidine (PCP) Ur S: NOT DETECTED
Tricyclic, Ur Screen: POSITIVE — AB

## 2015-09-10 LAB — URINALYSIS COMPLETE WITH MICROSCOPIC (ARMC ONLY)
Bacteria, UA: NONE SEEN
Bilirubin Urine: NEGATIVE
GLUCOSE, UA: NEGATIVE mg/dL
Hgb urine dipstick: NEGATIVE
Ketones, ur: NEGATIVE mg/dL
Nitrite: NEGATIVE
Protein, ur: NEGATIVE mg/dL
SPECIFIC GRAVITY, URINE: 1.003 — AB (ref 1.005–1.030)
SQUAMOUS EPITHELIAL / LPF: NONE SEEN
pH: 8 (ref 5.0–8.0)

## 2015-09-10 LAB — COMPREHENSIVE METABOLIC PANEL
ALBUMIN: 4.4 g/dL (ref 3.5–5.0)
ALT: 18 U/L (ref 14–54)
AST: 25 U/L (ref 15–41)
Alkaline Phosphatase: 58 U/L (ref 38–126)
Anion gap: 4 — ABNORMAL LOW (ref 5–15)
BUN: 7 mg/dL (ref 6–20)
CHLORIDE: 101 mmol/L (ref 101–111)
CO2: 31 mmol/L (ref 22–32)
CREATININE: 0.63 mg/dL (ref 0.44–1.00)
Calcium: 9.4 mg/dL (ref 8.9–10.3)
GFR calc Af Amer: >60 mL/min (ref >60)
GFR calc non Af Amer: >60 mL/min (ref >60)
GLUCOSE: 91 mg/dL (ref 65–99)
POTASSIUM: 3.2 mmol/L — AB (ref 3.5–5.1)
SODIUM: 136 mmol/L (ref 135–145)
Total Bilirubin: 0.6 mg/dL (ref 0.3–1.2)
Total Protein: 7 g/dL (ref 6.5–8.1)

## 2015-09-10 LAB — ACETAMINOPHEN LEVEL

## 2015-09-10 LAB — LIPASE, BLOOD: Lipase: 31 U/L (ref 11–51)

## 2015-09-10 LAB — SALICYLATE LEVEL

## 2015-09-10 LAB — ETHANOL

## 2015-09-10 LAB — TROPONIN I: Troponin I: <0.03 ng/mL (ref <0.031)

## 2015-09-10 MED ORDER — ALUM & MAG HYDROXIDE-SIMETH 200-200-20 MG/5ML PO SUSP
30.0000 mL | ORAL | Status: DC | PRN
  Administered 2015-09-12 – 2015-09-14 (×2): 30 mL via ORAL
  Filled 2015-09-10 (×3): qty 30

## 2015-09-10 MED ORDER — MAGNESIUM HYDROXIDE 400 MG/5ML PO SUSP: 30.0000 mL | Freq: Every day | ORAL | Status: DC | PRN

## 2015-09-10 MED ORDER — ACETAMINOPHEN 325 MG PO TABS
650.0000 mg | ORAL_TABLET | Freq: Four times a day (QID) | ORAL | Status: DC | PRN
  Administered 2015-09-17 – 2015-09-18 (×2): 650 mg via ORAL
  Filled 2015-09-10 (×2): qty 2

## 2015-09-10 MED ORDER — QUETIAPINE FUMARATE ER 200 MG PO TB24
200.0000 mg | ORAL_TABLET | Freq: Every day | ORAL | Status: DC
  Administered 2015-09-11 – 2015-09-20 (×10): 200 mg via ORAL
  Filled 2015-09-10 (×11): qty 1

## 2015-09-10 MED ORDER — QUETIAPINE FUMARATE ER 200 MG PO TB24
200.0000 mg | ORAL_TABLET | Freq: Every day | ORAL | Status: DC
  Administered 2015-09-10: 200 mg via ORAL
  Filled 2015-09-10: qty 1

## 2015-09-10 MED ORDER — MIRTAZAPINE 30 MG PO TABS
45.0000 mg | ORAL_TABLET | Freq: Every day | ORAL | Status: DC
  Administered 2015-09-11 – 2015-09-13 (×3): 45 mg via ORAL
  Filled 2015-09-10 (×3): qty 1

## 2015-09-10 MED ORDER — ENSURE ENLIVE PO LIQD
237.0000 mL | Freq: Three times a day (TID) | ORAL | Status: DC
  Administered 2015-09-11: 237 mL via ORAL
  Administered 2015-09-11: 200 mL via ORAL
  Administered 2015-09-11 – 2015-09-21 (×30): 237 mL via ORAL

## 2015-09-10 MED ORDER — MIRTAZAPINE 45 MG PO TABS
45.0000 mg | ORAL_TABLET | Freq: Every day | ORAL | Status: DC
  Administered 2015-09-10: 45 mg via ORAL
  Filled 2015-09-10: qty 1

## 2015-09-10 MED ORDER — POTASSIUM CHLORIDE CRYS ER 20 MEQ PO TBCR
40.0000 meq | EXTENDED_RELEASE_TABLET | Freq: Once | ORAL | Status: AC
  Administered 2015-09-10: 40 meq via ORAL
  Filled 2015-09-10: qty 2

## 2015-09-10 NOTE — BH Assessment (Signed)
Assessment Note  [name] [name] is an 54 y.o. female who presents to the ER, via EMS after being found unresponsive by her brother/guardain Jonny Ruiz [phone]). According to the patient she took her medications, because "I wanted it to be over." When asked for what she meant about the statement, she did things to avoid giving an answer.  She start asking for a drink of water and then she start asking this Clinical research associate, whether or not he goes to church.  Per the report of the patients brother/guardain Jonny Ruiz [phone]), the patient took an unknown number of her medications. They included; Seroquel, Lorazepam & Remeron.  He also reports, she still don't eat like she suppose to. At most, she eats two things of toast and some water.  He also states, she's responding to internal stimuli for awhile and it has increased in the last several days. Patient has told the brother, she's praying. He describes it as her yelling at the "top of her voice, yelling help."  Patient is being followed by Dr. Elesa Massed at [facility]. Only service she is receiving is medication management.  Diagnosis: Major Depression  Past Medical History: No past medical history on file.  Past Surgical History  Procedure Laterality Date  . Breast biopsy Bilateral   . Colonoscopy  1993    crohn's disease    Family History: No family history on file.  Social History:  reports that she has never smoked. She does not have any smokeless tobacco history on file. She reports that she does not drink alcohol or use illicit drugs.  Additional Social History:     CIWA: CIWA-Ar BP: 132/86 mmHg Pulse Rate: 89 COWS:    Allergies:  Allergies  Allergen Reactions  . Acyclovir     Other reaction(s): Other (See Comments) Orally, triggers a migraine  . Amoxicillin-Pot Clavulanate     REACTION: GI Upset  . Cefadroxil     Other reaction(s): Unknown  . Ciprofloxacin     REACTION: GI Upset.Also avelox and floxin  . Clarithromycin      REACTION: GI Upset  . Doxycycline     Other reaction(s): Unknown  . Erythromycin   . Levofloxacin   . Sulfamethoxazole-Trimethoprim     REACTION: itching  . Albumin (Human) Nausea And Vomiting  . Penicillins Nausea Only    Home Medications:  (Not in a hospital admission)  OB/GYN Status:  No LMP recorded. Patient is postmenopausal.  General Assessment Data Location of Assessment: [facility] ED TTS Assessment: In system Is this a Tele or Face-to-Face Assessment?: Face-to-Face Is this an Initial Assessment or a Re-assessment for this encounter?: Initial Assessment Marital status: Single Maiden name: Sheen  Is patient pregnant?: No Pregnancy Status: No Living Arrangements: Other relatives Can pt return to current living arrangement?: Yes Admission Status: Involuntary Is patient capable of signing voluntary admission?: Yes Referral Source: Self/Family/Friend Insurance type: Medicare  Medical Screening Exam [facility] Walk-in ONLY) Medical Exam completed: Yes  Crisis Care Plan Living Arrangements: Other relatives Name of Psychiatrist: Dr. Elesa Massed ([facility]) Name of Therapist: None ([facility])  Education Status Is patient currently in school?: No Current Grade: n/a Highest grade of school patient has completed: [employer] Name of school: n/a Contact person: n/a  Risk to self with the past 6 months Suicidal Ideation: Yes-Currently Present Has patient been a risk to self within the past 6 months prior to admission? : Yes Suicidal Intent: Yes-Currently Present Has patient had any suicidal intent within the past 6 months prior to admission? :  Yes Is patient at risk for suicide?: Yes Suicidal Plan?: Yes-Currently Present Has patient had any suicidal plan within the past 6 months prior to admission? : Yes Specify Current Suicidal Plan: Medication Overdose Access to Means: Yes Specify Access to Suicidal Means: Overdose on medication What has been your use of drugs/alcohol within the last 12  months?: None Reported Previous Attempts/Gestures: Yes How many times?: 2 Other Self Harm Risks: Refusing to eat an medication overdose Triggers for Past Attempts: None known Intentional Self Injurious Behavior:  (Not eatting) Family Suicide History: No Recent stressful life event(s): Other (Comment), Loss (Comment), Recent negative physical changes, Turmoil (Comment) (Father died last month) Persecutory voices/beliefs?: No Depression: Yes Depression Symptoms: Despondent, Tearfulness, Isolating, Fatigue, Guilt, Loss of interest in usual pleasures, Feeling worthless/self pity, Feeling angry/irritable Substance abuse history and/or treatment for substance abuse?: No Suicide prevention information given to non-admitted patients: Not applicable  Risk to Others within the past 6 months Homicidal Ideation: No Does patient have any lifetime risk of violence toward others beyond the six months prior to admission? : No Thoughts of Harm to Others: No Current Homicidal Intent: No Current Homicidal Plan: No Access to Homicidal Means: No Identified Victim: None Reported History of harm to others?: No Assessment of Violence: None Noted Violent Behavior Description: None Reported Does patient have access to weapons?: No Criminal Charges Pending?: No Does patient have a court date: No Is patient on probation?: No  Psychosis Hallucinations: Auditory Delusions: Unspecified (Religious, per brother.)  Mental Status Report Appearance/Hygiene: In scrubs, In hospital gown Eye Contact: Poor Motor Activity: Unable to assess (Patient is lying in the bed) Speech: Soft, Slow, Slurred Level of Consciousness: Drowsy Mood: Anxious, Depressed, Helpless, Guilty, Sad Affect: Depressed, Anxious, Sad Anxiety Level: Minimal Thought Processes: Coherent, Relevant Judgement: Impaired Orientation: Person, Situation, Appropriate for developmental age, Place Obsessive Compulsive Thoughts/Behaviors:  Minimal  Cognitive Functioning Concentration: Decreased Memory: Recent Impaired, Remote Intact IQ: Average Insight: Poor Impulse Control: Poor Appetite: Poor Weight Loss: 15 Weight Gain: 0 Sleep: No Change Total Hours of Sleep: 6 Vegetative Symptoms: None  ADLScreening [facility] Assessment Services) Patient's cognitive ability adequate to safely complete daily activities?: Yes Patient able to express need for assistance with ADLs?: Yes Independently performs ADLs?: No  Prior Inpatient Therapy Prior Inpatient Therapy: Yes Prior Therapy Dates: 01/2015 & 01/2013 Prior Therapy Facilty/Provider([facility]): [facility] Reason for Treatment: Suicidal & Depression  Prior Outpatient Therapy Prior Outpatient Therapy: Yes Prior Therapy Dates: Current Prior Therapy Facilty/Provider(s): [facility] Reason for Treatment: Depression Does patient have an ACCT team?: No Does patient have Intensive In-House Services?  : No Does patient have [facility] services? : No Does patient have P4CC services?: No  ADL Screening (condition at time of admission) Patient's cognitive ability adequate to safely complete daily activities?: Yes Patient able to express need for assistance with ADLs?: Yes Independently performs ADLs?: No       Abuse/Neglect Assessment (Assessment to be complete while patient is alone) Physical Abuse: Denies Verbal Abuse: Denies Sexual Abuse: Denies Exploitation of patient/patient's resources: Denies Self-Neglect: Denies Values / Beliefs Cultural Requests During Hospitalization: None Spiritual Requests During Hospitalization: None Consults Spiritual Care Consult Needed: No Social Work Consult Needed: No      Additional Information 1:1 In Past 12 Months?: No CIRT Risk: No Elopement Risk: No Does patient have medical clearance?: No  Child/Adolescent Assessment Running Away Risk: Denies (Patient is an adult)  Disposition:  Disposition Initial Assessment Completed for this Encounter:  Yes Disposition of Patient: Other dispositions (Psych  MD to see) Other disposition(s): Other (Comment) (Psych MD to see)  On Site Evaluation by:   Reviewed with Physician:     Lilyan Gilford, MS, LCAS, LPC, NCC, CCSI  09/10/2015 4:21 PM

## 2015-09-10 NOTE — ED Notes (Signed)
All curlers and pins removed from pt's hair for head CT; placed in zip lock bag and placed in pt's belonging bag;  pt noted to be incontinent of urine; cleaned well; dry attends and dry gown in place; pt moving about on bed but remains nonverbal; once said "huh" when name called but no communication with staff;

## 2015-09-10 NOTE — ED Notes (Signed)
Dr. [name] at bedside attempted to speak with patient.

## 2015-09-10 NOTE — BHH Counselor (Signed)
Writer unable to assess, due to patient being sedated at this time.

## 2015-09-10 NOTE — ED Notes (Signed)
Behavioral medicine not taking report at this time because passing medications.

## 2015-09-10 NOTE — ED Notes (Signed)
Patient transported to CT 

## 2015-09-10 NOTE — ED Notes (Signed)
Pt only responsive to painful stimuli, not answering verbally at this time.

## 2015-09-10 NOTE — ED Notes (Signed)
TTS, Calvin, at bedside.

## 2015-09-10 NOTE — Consult Note (Signed)
[facility] Face-to-Face Psychiatry Consult   Reason for Consult:  Consult for this 54 year old woman with a history of mental health problems carrying several different diagnoses as well as a history of severe and dangerous self-inflicted weight loss. She is brought in because of altered mental status. Referring Physician:  Cinda Quest Patient Identification: [name] [name] MRN:  [id number] Principal Diagnosis: Sedative intoxication [facility]) Diagnosis:   Patient Active Problem List   Diagnosis Date Noted  . 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 Schizophrenia ([place]) [F20.9] 08/27/2015  . Anorexia nervosa [F50.00] 08/05/2015  . Underweight [R63.6] 08/05/2015  . Hypokalemia [E87.6] 08/05/2015  . Depression [F32.9] 08/05/2015  . 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 PALPITATIONS [R00.2] 02/07/2010  . HERPES ZOSTER [B02.9] 02/08/2008  . MIGRAINE HEADACHE [G43.909] 02/08/2008  . 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 IBD [K52.89] 02/08/2008  . CONSTIPATION, CHRONIC [K59.09] 02/08/2008  . ANEMIA, IRON DEFICIENCY [D50.9] 01/25/2008  . 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 OSTEOARTHRITIS [M19.90] 01/25/2008  . POLYARTHRALGIA [M25.50] 01/25/2008  . OSTEOPOROSIS [M81.0] 01/25/2008    Total Time spent with patient: 45 minutes  Subjective:   [name] [name] is a 54 y.o. female patient admitted with patient was sedated to the point of being unable to give any information at all.  HPI:  Information from the chart primarily. I'm also quite familiar with this patient having worked with her in several different settings here in the hospital before. Patient herself was unable to engage in any interview at all. I'm being told secondhand that she had gone to her outpatient appointment at about [facility] possibly yesterday and that afterwards she was found in this condition of altered mental state. At this point I don't have any other details. There seems to be an  implication that she may have overdosed on medication but I don't yet have proof of that.  Medical history: Patient has a history of severe weight loss which has been self-inflicted. She has been variously diagnosed with anorexia nervosa, depression and schizophrenia. Her weight loss and anorexia behavior appears to be not just due to the usual thought process of anorexia but recently when I saw her she was minimizing or denying depressive symptoms. Patient also has a history of Crohn's disease, chronic constipation, history of migraines history of anemia most of which is related to her weight loss.  Social history: Patient is currently living with her brother. For many years she had lived with her father until he passed away recently. At that time her brother moved into the house to take care of her allegedly. Since then patient has been feeling much worse. I know all this from a conversation I had with her last time she was in the emergency room. Patient herself does not have any children. Her brother is her closest contact.  Family history: Positive for depression  Substance abuse history: Patient does not have a history of any recent substance abuse that we know of.  Past Psychiatric History: Patient has had several psychiatric admissions including at least one very lengthy hospitalization here. Diagnoses that of been considered a variously included severe psychotic depression, schizophrenia and anorexia. Part of the confusion I think comes from being unclear about what kind of symptoms she had during the many years that she and her father were living together. Patient's function has clearly been getting worse in recent years and especially seems to be getting worse since her father passed away. Does have a history of suicidality and suicide attempts  in the past. Has generally been resistant to treatment and has poor insight. Minimal improvement with medication. I have offered her ECT on more than one  occasion in the past and she has consistently declined.  Risk to Self:   Risk to Others:   Prior Inpatient Therapy:   Prior Outpatient Therapy:    Past Medical History: No past medical history on file.  Past Surgical History  Procedure Laterality Date  . Breast biopsy Bilateral   . Colonoscopy  1993    crohn's disease   Family History: No family history on file. Family Psychiatric  History: Patient does have a family history of depression. I don't believe there is any history of suicide Social History:  History  Alcohol Use No     History  Drug Use No    Social History   Social History  . Marital Status: Single    Spouse Name: N/A  . Number of Children: N/A  . Years of Education: N/A   Social History Main Topics  . Smoking status: Never Smoker   . Smokeless tobacco: Not on file  . Alcohol Use: No  . Drug Use: No  . Sexual Activity: Not on file   Other Topics Concern  . Not on file   Social History Narrative   Additional Social History:                          Allergies:   Allergies  Allergen Reactions  . Acyclovir     Other reaction(s): Other (See Comments) Orally, triggers a migraine  . Amoxicillin-Pot Clavulanate     REACTION: GI Upset  . Cefadroxil     Other reaction(s): Unknown  . Ciprofloxacin     REACTION: GI Upset.Also avelox and floxin  . Clarithromycin     REACTION: GI Upset  . Doxycycline     Other reaction(s): Unknown  . Erythromycin   . Levofloxacin   . Sulfamethoxazole-Trimethoprim     REACTION: itching  . Albumin (Human) Nausea And Vomiting  . Penicillins Nausea Only    Labs:  Results for orders placed or performed during the hospital encounter of 09/09/15 (from the past 48 hour(s))  Urinalysis complete, with microscopic     Status: Abnormal   Collection Time: 09/09/15 11:29 PM  Result Value Ref Range   Color, Urine STRAW (A) YELLOW   APPearance CLEAR (A) CLEAR   Glucose, UA NEGATIVE NEGATIVE mg/dL   Bilirubin  Urine NEGATIVE NEGATIVE   Ketones, ur NEGATIVE NEGATIVE mg/dL   Specific Gravity, Urine 1.003 (L) 1.005 - 1.030   Hgb urine dipstick NEGATIVE NEGATIVE   pH 8.0 5.0 - 8.0   Protein, ur NEGATIVE NEGATIVE mg/dL   Nitrite NEGATIVE NEGATIVE   Leukocytes, UA TRACE (A) NEGATIVE   RBC / HPF 0-5 0 - 5 RBC/hpf   WBC, UA 0-5 0 - 5 WBC/hpf   Bacteria, UA NONE SEEN NONE SEEN   Squamous Epithelial / LPF NONE SEEN NONE SEEN  Comprehensive metabolic panel     Status: Abnormal   Collection Time: 09/09/15 11:29 PM  Result Value Ref Range   Sodium 136 135 - 145 mmol/L   Potassium 3.2 (L) 3.5 - 5.1 mmol/L   Chloride 101 101 - 111 mmol/L   CO2 31 22 - 32 mmol/L   Glucose, Bld 91 65 - 99 mg/dL   BUN 7 6 - 20 mg/dL   Creatinine, Ser 0.63 0.44 - 1.00 mg/dL  Calcium 9.4 8.9 - 10.3 mg/dL   Total Protein 7.0 6.5 - 8.1 g/dL   Albumin 4.4 3.5 - 5.0 g/dL   AST 25 15 - 41 U/L   ALT 18 14 - 54 U/L   Alkaline Phosphatase 58 38 - 126 U/L   Total Bilirubin 0.6 0.3 - 1.2 mg/dL   GFR calc non Af Amer >60 >60 mL/min   GFR calc Af Amer >60 >60 mL/min    Comment: (NOTE) The eGFR has been calculated using the CKD EPI equation. This calculation has not been validated in all clinical situations. eGFR's persistently <60 mL/min signify possible Chronic Kidney Disease.    Anion gap 4 (L) 5 - 15  Ethanol     Status: None   Collection Time: 09/09/15 11:29 PM  Result Value Ref Range   Alcohol, Ethyl (B) <5 <5 mg/dL    Comment:        LOWEST DETECTABLE LIMIT FOR SERUM ALCOHOL IS 5 mg/dL FOR MEDICAL PURPOSES ONLY   Lipase, blood     Status: None   Collection Time: 09/09/15 11:29 PM  Result Value Ref Range   Lipase 31 11 - 51 U/L    Comment: Please note change in reference range.  Troponin I     Status: None   Collection Time: 09/09/15 11:29 PM  Result Value Ref Range   Troponin I <0.03 <0.031 ng/mL    Comment:        NO INDICATION OF MYOCARDIAL INJURY.   Salicylate level     Status: None   Collection  Time: 09/09/15 11:29 PM  Result Value Ref Range   Salicylate Lvl <0.9 2.8 - 30.0 mg/dL  Acetaminophen level     Status: Abnormal   Collection Time: 09/09/15 11:29 PM  Result Value Ref Range   Acetaminophen (Tylenol), Serum <10 (L) 10 - 30 ug/mL    Comment:        THERAPEUTIC CONCENTRATIONS VARY SIGNIFICANTLY. A RANGE OF 10-30 ug/mL MAY BE AN EFFECTIVE CONCENTRATION FOR MANY PATIENTS. HOWEVER, SOME ARE BEST TREATED AT CONCENTRATIONS OUTSIDE THIS RANGE. ACETAMINOPHEN CONCENTRATIONS >150 ug/mL AT 4 HOURS AFTER INGESTION AND >50 ug/mL AT 12 HOURS AFTER INGESTION ARE OFTEN ASSOCIATED WITH TOXIC REACTIONS.   CBC with Differential     Status: Abnormal   Collection Time: 09/09/15 11:29 PM  Result Value Ref Range   WBC 2.7 (L) 3.6 - 11.0 K/uL   RBC 4.29 3.80 - 5.20 MIL/uL   Hemoglobin 14.1 12.0 - 16.0 g/dL   HCT 40.8 35.0 - 47.0 %   MCV 95.2 80.0 - 100.0 fL   MCH 32.8 26.0 - 34.0 pg   MCHC 34.5 32.0 - 36.0 g/dL   RDW 13.5 11.5 - 14.5 %   Platelets 191 150 - 440 K/uL   Neutrophils Relative % 45 %   Neutro Abs 1.2 (L) 1.4 - 6.5 K/uL   Lymphocytes Relative 42 %   Lymphs Abs 1.1 1.0 - 3.6 K/uL   Monocytes Relative 9 %   Monocytes Absolute 0.2 0.2 - 0.9 K/uL   Eosinophils Relative 3 %   Eosinophils Absolute 0.1 0 - 0.7 K/uL   Basophils Relative 1 %   Basophils Absolute 0.0 0 - 0.1 K/uL  Urine Drug Screen, Qualitative     Status: Abnormal   Collection Time: 09/09/15 11:29 PM  Result Value Ref Range   Tricyclic, Ur Screen POSITIVE (A) NONE DETECTED   Amphetamines, Ur Screen NONE DETECTED NONE DETECTED   MDMA (Ecstasy)Ur Screen  NONE DETECTED NONE DETECTED   Cocaine Metabolite,Ur [place] NONE DETECTED NONE DETECTED   Opiate, Ur Screen NONE DETECTED NONE DETECTED   Phencyclidine (PCP) Ur S NONE DETECTED NONE DETECTED   Cannabinoid 50 Ng, Ur [place] NONE DETECTED NONE DETECTED   Barbiturates, Ur Screen NONE DETECTED NONE DETECTED   Benzodiazepine, Ur Scrn POSITIVE (A) NONE DETECTED    Methadone Scn, Ur NONE DETECTED NONE DETECTED    Comment: (NOTE) 093  Tricyclics, urine               Cutoff 1000 ng/mL 200  Amphetamines, urine             Cutoff 1000 ng/mL 300  MDMA (Ecstasy), urine           Cutoff 500 ng/mL 400  Cocaine Metabolite, urine       Cutoff 300 ng/mL 500  Opiate, urine                   Cutoff 300 ng/mL 600  Phencyclidine (PCP), urine      Cutoff 25 ng/mL 700  Cannabinoid, urine              Cutoff 50 ng/mL 800  Barbiturates, urine             Cutoff 200 ng/mL 900  Benzodiazepine, urine           Cutoff 200 ng/mL 1000 Methadone, urine                Cutoff 300 ng/mL 1100 1200 The urine drug screen provides only a preliminary, unconfirmed 1300 analytical test result and should not be used for non-medical 1400 purposes. Clinical consideration and professional judgment should 1500 be applied to any positive drug screen result due to possible 1600 interfering substances. A more specific alternate chemical method 1700 must be used in order to obtain a confirmed analytical result.  1800 Gas chromato graphy / mass spectrometry (GC/MS) is the preferred 1900 confirmatory method.     No current facility-administered medications for this encounter.   Current Outpatient Prescriptions  Medication Sig Dispense Refill  . LORazepam (ATIVAN) 1 MG tablet Take 0.5 mg by mouth 2 (two) times daily.    Marland Kitchen LORazepam (ATIVAN) 1 MG tablet Take 1 mg by mouth at bedtime.    . megestrol (MEGACE) 40 MG tablet Take 40 mg by mouth at bedtime.     . metoprolol succinate (TOPROL-XL) 25 MG 24 hr tablet Take 1 tablet (25 mg total) by mouth daily. 90 tablet 3  . mirtazapine (REMERON) 15 MG tablet Take 15 mg by mouth at bedtime.    . mirtazapine (REMERON) 30 MG tablet Take 30 mg by mouth at bedtime.    Marland Kitchen QUEtiapine (SEROQUEL XR) 200 MG 24 hr tablet Take 200 mg by mouth at bedtime.    Marland Kitchen QUEtiapine (SEROQUEL XR) 50 MG TB24 24 hr tablet Take 50 mg by mouth at bedtime.       Musculoskeletal: Strength & Muscle Tone: decreased and atrophy Gait & Station: unable to stand Patient leans: N/A  Psychiatric Specialty Exam: Review of Systems  Unable to perform ROS: mental acuity    Blood pressure 111/73, pulse 85, temperature 95.7 F (35.4 C), temperature source Rectal, resp. rate 17, weight 35.381 kg (78 lb), SpO2 100 %.Body mass index is 12.98 kg/(m^2).  General Appearance: Disheveled and Chronically ill and malnourished  Eye Contact::  None  Speech:  Negative  Volume:  Decreased  Mood:  Negative  Affect:  Negative  Thought Process:  Negative  Orientation:  Negative  Thought Content:  Negative  Suicidal Thoughts:  No  Homicidal Thoughts:  No  Memory:  Immediate;   Negative Recent;   Negative Remote;   Negative  Judgement:  Negative  Insight:  Negative  Psychomotor Activity:  Negative  Concentration:  Negative  Recall:  Negative  Fund of Knowledge:Negative  Language: Negative  Akathisia:  Negative  Handed:  Right  AIMS (if indicated):     Assets:  Resilience  ADL's:  Impaired  Cognition: Impaired,  Severe  Sleep:      Treatment Plan Summary: Daily contact with patient to assess and evaluate symptoms and progress in treatment, Medication management and Plan This 54 year old woman with a history of multiple severe medical and psychiatric problems is currently unconscious. Her blood pressure is adequate although low and her oxygenation is stable but she is completely unarousable. There is a suggestion that she may have overdosed on medicine but I don't see that we really have enough proof of that yet. Certainly she needs hospital treatment whether it be initially on the medical service or psychiatric service I'm not sure. Case discussed with Dr. Rip Harbour. He indicated to me that he was leaning towards admission to medical service in which case I will follow her as a Optometrist. For now on going to hold off on restarting any medicine at least until she  wakes up. I anticipate likely psychiatric admission in the longer run.  Disposition: Recommend psychiatric Inpatient admission when medically cleared.  John Clapacs 09/10/2015 1:22 PM

## 2015-09-10 NOTE — ED Provider Notes (Signed)
[facility] Emergency Department Provider Note  ____________________________________________  Time seen: 11:05 PM on arrival by EMS  I have reviewed the triage vital signs and the nursing notes.   HISTORY  Chief Complaint Ingestion  History Limited by patient's altered mental status  HPI [name] [name] is a 54 y.o. female brought to the ED by EMS due to altered mental status. Patient was found unresponsive in her home after an apparent ingestion of benzodiazepines which she does take at home.Her brother reports that he thinks this is a suicide attempt. The patient does have a known ental health history.     No past medical history on file. See below  Patient Active Problem List   Diagnosis Date Noted  . Schizophrenia ([facility]) 08/27/2015  . Anorexia nervosa 08/05/2015  . Underweight 08/05/2015  . Hypokalemia 08/05/2015  . Depression 08/05/2015  . 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 PALPITATIONS 02/07/2010  . HERPES ZOSTER 02/08/2008  . MIGRAINE HEADACHE 02/08/2008  . 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 IBD 02/08/2008  . CONSTIPATION, CHRONIC 02/08/2008  . ANEMIA, IRON DEFICIENCY 01/25/2008  . CROHN'S DISEASE, LARGE INTESTINE 01/25/2008  . OSTEOARTHRITIS 01/25/2008  . POLYARTHRALGIA 01/25/2008  . OSTEOPOROSIS 01/25/2008     Past Surgical History  Procedure Laterality Date  . Breast biopsy Bilateral   . Colonoscopy  1993    crohn's disease     Current Outpatient Rx  Name  Route  Sig  Dispense  Refill  . LORazepam (ATIVAN) 1 MG tablet   Oral   Take 0.5 mg by mouth 2 (two) times daily.         Marland Kitchen LORazepam (ATIVAN) 1 MG tablet   Oral   Take 1 mg by mouth at bedtime.         . megestrol (MEGACE) 40 MG tablet   Oral   Take 40 mg by mouth at bedtime.          . metoprolol succinate (TOPROL-XL) 25 MG 24 hr tablet   Oral   Take 1 tablet (25 mg total) by mouth daily.   90 tablet   3   . mirtazapine (REMERON) 15 MG  tablet   Oral   Take 15 mg by mouth at bedtime.         . mirtazapine (REMERON) 30 MG tablet   Oral   Take 30 mg by mouth at bedtime.         . potassium chloride (K-DUR) 10 MEQ tablet   Oral   Take 10 mEq by mouth 2 (two) times daily.         . QUEtiapine (SEROQUEL XR) 200 MG 24 hr tablet   Oral   Take 200 mg by mouth at bedtime.         Marland Kitchen QUEtiapine (SEROQUEL XR) 50 MG TB24 24 hr tablet   Oral   Take 50 mg by mouth at bedtime.            Allergies Acyclovir; Amoxicillin-pot clavulanate; Cefadroxil; Ciprofloxacin; Clarithromycin; Doxycycline; Erythromycin; Levofloxacin; Sulfamethoxazole-trimethoprim; Albumin (human); and Penicillins   No family history on file.  Social History Social History  Substance Use Topics  . Smoking status: Never Smoker   . Smokeless tobacco: Not on file  . Alcohol Use: No    Review of Systems Unable to obtain due to altered mental status and stupor ____________________________________________   PHYSICAL EXAM:  VITAL SIGNS: ED Triage Vitals  Enc Vitals Group     BP 09/09/15 2314 145/85 mmHg  Pulse Rate 09/09/15 2314 73     Resp 09/09/15 2314 16     Temp 09/09/15 2330 95.7 F (35.4 C)     Temp Source 09/09/15 2330 Rectal     SpO2 09/09/15 2314 100 %     Weight 09/09/15 2314 78 lb (35.381 kg)     Height --      Head Cir --      Peak Flow --      Pain Score --      Pain Loc --      Pain Edu? --      Excl. in GC? --      Constitutional:   Stuporous Eyes:   No scleral icterus. No conjunctival pallor. Pupils equal at 5-6 mm, sluggish, no purposeful movement of gaze ENT   Head:   Normocephalic and atraumatic.   Nose:   No congestion/rhinnorhea. No septal hematoma   Mouth/Throat:   MMM, no pharyngeal erythema. No peritonsillar mass. No uvula shift.   Neck:   No stridor. No SubQ emphysema. No meningismus. Hematological/Lymphatic/Immunilogical:   No cervical lymphadenopathy. Cardiovascular:   RRR. Normal  and symmetric distal pulses are present in all extremities. No murmurs, rubs, or gallops. Respiratory:   Normal respiratory effort without tachypnea nor retractions. Breath sounds are clear and equal bilaterally. No wheezes/rales/rhonchi. Gastrointestinal:   Soft and nontender. No distention. There is no CVA tenderness.  No rebound, rigidity, or guarding. Genitourinary:   deferred Musculoskeletal:   Nontender with normal range of motion in all extremities. No joint effusions.  No lower extremity tenderness.  No edema. Neurologic:   Stuporous. With stimulation the patient will move all 4 extremities and turn her head.  GCS = [id number] = 5 Skin:    Skin is warm, dry and intact. No rash noted.  No petechiae, purpura, or bullae.  ____________________________________________    LABS (pertinent positives/negatives) (all labs ordered are listed, but only abnormal results are displayed) Labs Reviewed  URINALYSIS COMPLETEWITH MICROSCOPIC ([facility] ONLY) - Abnormal; Notable for the following:    Color, Urine STRAW (*)    APPearance CLEAR (*)    Specific Gravity, Urine 1.003 (*)    Leukocytes, UA TRACE (*)    All other components within normal limits  COMPREHENSIVE METABOLIC PANEL - Abnormal; Notable for the following:    Potassium 3.2 (*)    Anion gap 4 (*)    All other components within normal limits  ACETAMINOPHEN LEVEL - Abnormal; Notable for the following:    Acetaminophen (Tylenol), Serum <10 (*)    All other components within normal limits  CBC WITH DIFFERENTIAL/PLATELET - Abnormal; Notable for the following:    WBC 2.7 (*)    Neutro Abs 1.2 (*)    All other components within normal limits  URINE DRUG SCREEN, QUALITATIVE ([facility] ONLY) - Abnormal; Notable for the following:    Tricyclic, Ur Screen POSITIVE (*)    Benzodiazepine, Ur Scrn POSITIVE (*)    All other components within normal limits  ETHANOL  LIPASE, BLOOD  TROPONIN I  SALICYLATE LEVEL    ____________________________________________   EKG  Interpreted by me Normal sinus rhythm rate of 84, normal axis and intervals. Poor R-wave progression in anterior precordial leads. Normal ST segments and T waves.  ____________________________________________    RADIOLOGY  Chest x-ray unremarkable  ____________________________________________   PROCEDURES CRITICAL CARE Performed by: Scotty Court, Tieara Flitton   Total critical care time: 35 minutes  Critical care time was exclusive of separately billable procedures and treating  other patients.  Critical care was necessary to treat or prevent imminent or life-threatening deterioration.  Critical care was time spent personally by me on the following activities: development of treatment plan with patient and/or surrogate as well as nursing, discussions with consultants, evaluation of patient's response to treatment, examination of patient, obtaining history from patient or surrogate, ordering and performing treatments and interventions, ordering and review of laboratory studies, ordering and review of radiographic studies, pulse oximetry and re-evaluation of patient's condition.   ____________________________________________   INITIAL IMPRESSION / ASSESSMENT AND PLAN / ED COURSE  Pertinent labs & imaging results that were available during my care of the patient were reviewed by me and considered in my medical decision making (see chart for details).  Patient presents with altered mental status and stupor and an apparent suicide attempt by benzodiazepine overdose. She is hemodynamically stable and does not have any evidence of significant trauma. She is maintaining her respirations. We will monitor her closely due to the altered level of consciousness impairing her function and sensorium and initiated an IVC petition due to the possible attempt at self-harm.  ----------------------------------------- 3:50 AM on  09/10/2015 ----------------------------------------- Vital signs remained stable. Lab workup was unremarkable. Patient is slightly more arousable with stimulation and moves vigorously with light touch but still unable to focus and interactive. We'll continue to monitor. U tox positive for benzos. As the patient has not significantly improved in the 5 or 6 hours since her ingestion, we'll go ahead and get a CT scan of the head to evaluate for hemorrhage that is confounded by her overdose presentation.  ----------------------------------------- 5:44 AM on 09/10/2015 -----------------------------------------  CT head unremarkable. Vital signs remained stable and unremarkable. We'll continue to observe and monitor in the emergency department pending improvement in mental status to allow for psychiatric evaluation. Care of the patient will be signed out to the oncoming physician at 7:00. Patient remains under involuntary commitment pending psychiatric clearance.    ____________________________________________   FINAL CLINICAL IMPRESSION(S) / ED DIAGNOSES  Final diagnoses:  Benzodiazepine overdose of undetermined intent, initial encounter      Sharman Cheek, MD 09/10/15 [phone]

## 2015-09-10 NOTE — ED Notes (Signed)
Per MD pt does not require 1:1 sitter; verbal order given for q15 min checks; pt placed on posey alarm for safety

## 2015-09-10 NOTE — ED Notes (Addendum)
Pt alert, responsive to voice.  Oriented to person, place, and situation.  Answers appropriately but speech is mostly mumbled and incomprehensible.  Pt placed on bedpan.  Patient said she took Remeron when asked.

## 2015-09-10 NOTE — ED Notes (Signed)
TTS, [name] [name], attempted to speak with patient.

## 2015-09-10 NOTE — ED Notes (Signed)
Pt changed into maroon scrubs; remains responsive to pain only; again said "huh" once when name called; belongings including clothing and rollers/pins placed in belongings bag to be locked in locker room in [place];

## 2015-09-10 NOTE — Consult Note (Addendum)
[facility] [place] at [facility]   PATIENT NAME: [name] [name]    MR#:  [id number]  DATE OF BIRTH:  Jan 25, 1961  DATE OF ADMISSION:  09/09/2015  PRIMARY CARE PHYSICIAN: No primary care provider on file.   REQUESTING/REFERRING PHYSICIAN: Dr. Juliette Alcide.  CHIEF COMPLAINT:   Chief Complaint  Patient presents with  . Ingestion   unresponsive  HISTORY OF PRESENT ILLNESS:  [name] [name]  is a 54 y.o. female with a known history of schizophrenia, depression, migraine headache, IBD and anemia. The patient was sent to ED last night due to unresponsiveness possible due to drug overdose. Dr. [name] evaluated the patient, suggesting the patient may have a drug overdose but no prof so far. He anticipate likely psychiatric admission in the longer run. Since the patient has been unresponsive since last night, Dr. Juliette Alcide request a medical consult.  PAST MEDICAL HISTORY:  No past medical history on file. schizophrenia, depression, underweight, anorexia nervosa, migraine headache, rhinosinusitis, IBD, anemia, osteoarthritis, osteoporosis, polyarthralgia.  PAST SURGICAL HISTOIRY:   Past Surgical History  Procedure Laterality Date  . Breast biopsy Bilateral   . Colonoscopy  1993    crohn's disease    SOCIAL HISTORY:   Social History  Substance Use Topics  . Smoking status: Never Smoker   . Smokeless tobacco: Not on file  . Alcohol Use: No    FAMILY HISTORY:  No family history on file. father has dementia, mother has had allergic rhinitis.  DRUG ALLERGIES:   Allergies  Allergen Reactions  . Acyclovir     Other reaction(s): Other (See Comments) Orally, triggers a migraine  . Amoxicillin-Pot Clavulanate     REACTION: GI Upset  . Cefadroxil     Other reaction(s): Unknown  . Ciprofloxacin     REACTION: GI Upset.Also avelox and floxin  . Clarithromycin     REACTION: GI Upset  . Doxycycline     Other reaction(s): Unknown  . Erythromycin   .  Levofloxacin   . Sulfamethoxazole-Trimethoprim     REACTION: itching  . Albumin (Human) Nausea And Vomiting  . Penicillins Nausea Only    REVIEW OF SYSTEMS:  The patient is alert and awake but looks lethargic and confused. She only answered limited questions. CONSTITUTIONAL: No fever, fatigue or weakness.  RESPIRATORY: No cough, shortness of breath, wheezing or hemoptysis.  CARDIOVASCULAR: No chest pain, orthopnea, edema.  GASTROINTESTINAL: No nausea, vomiting, diarrhea or abdominal pain.   HEMATOLOGY: No bleeding MUSCULOSKELETAL: No joint pain. NEUROLOGIC: No tingling, numbness, weakness.  PSYCHIATRY: Has anxiety but no depression. The patient didn't answer whether she has suicidal ideation.  MEDICATIONS AT HOME:   Prior to Admission medications   Medication Sig Start Date End Date Taking? Authorizing Provider  LORazepam (ATIVAN) 1 MG tablet Take 0.5 mg by mouth 2 (two) times daily.   Yes Historical Provider, MD  LORazepam (ATIVAN) 1 MG tablet Take 1 mg by mouth at bedtime.   Yes Historical Provider, MD  megestrol (MEGACE) 40 MG tablet Take 40 mg by mouth at bedtime.    Yes Historical Provider, MD  metoprolol succinate (TOPROL-XL) 25 MG 24 hr tablet Take 1 tablet (25 mg total) by mouth daily. 06/25/15  Yes Schuyler Amor, MD  mirtazapine (REMERON) 15 MG tablet Take 15 mg by mouth at bedtime.   Yes Historical Provider, MD  mirtazapine (REMERON) 30 MG tablet Take 30 mg by mouth at bedtime.   Yes Historical Provider, MD  QUEtiapine (SEROQUEL XR) 200 MG 24 hr tablet  Take 200 mg by mouth at bedtime.   Yes Historical Provider, MD  QUEtiapine (SEROQUEL XR) 50 MG TB24 24 hr tablet Take 50 mg by mouth at bedtime.   Yes Historical Provider, MD      VITAL SIGNS:  Blood pressure 114/72, pulse 94, temperature 95.7 F (35.4 C), temperature source Rectal, resp. rate 18, weight 35.381 kg (78 lb), SpO2 99 %.  PHYSICAL EXAMINATION:  GENERAL:  54 y.o.-year-old patient lying in the bed with no acute  distress. Very thin. EYES: Pupils equal, round, reactive to light and accommodation. No scleral icterus. Extraocular muscles intact.  HEENT: Head atraumatic, normocephalic. Oropharynx and nasopharynx clear.  NECK:  Supple, no jugular venous distention. No thyroid enlargement, no tenderness.  LUNGS: Normal breath sounds bilaterally, no wheezing, rales,rhonchi or crepitation. No use of accessory muscles of respiration.  CARDIOVASCULAR: S1, S2 normal. No murmurs, rubs, or gallops.  ABDOMEN: Soft, nontender, nondistended. Bowel sounds present. No organomegaly or mass.  EXTREMITIES: No pedal edema, cyanosis, or clubbing.  NEUROLOGIC: Cranial nerves II through XII are intact. Muscle strength 4/5 in all extremities. Sensation intact. Gait not checked.  PSYCHIATRIC: The patient is alert and oriented x 3.  SKIN: No obvious rash, lesion, or ulcer.   LABORATORY PANEL:   CBC  Recent Labs Lab 09/09/15 2329  WBC 2.7*  HGB 14.1  HCT 40.8  PLT 191   ------------------------------------------------------------------------------------------------------------------  Chemistries   Recent Labs Lab 09/09/15 2329  NA 136  K 3.2*  CL 101  CO2 31  GLUCOSE 91  BUN 7  CREATININE 0.63  CALCIUM 9.4  AST 25  ALT 18  ALKPHOS 58  BILITOT 0.6   ------------------------------------------------------------------------------------------------------------------  Cardiac Enzymes  Recent Labs Lab 09/09/15 2329  TROPONINI <0.03   ------------------------------------------------------------------------------------------------------------------  RADIOLOGY:  Ct Head Wo Contrast  09/10/2015  CLINICAL DATA:  Patient responsive only to painful stimuli. Intermittent apnea. Initial encounter. EXAM: CT HEAD WITHOUT CONTRAST TECHNIQUE: Contiguous axial images were obtained from the base of the skull through the vertex without intravenous contrast. COMPARISON:  CT of the head performed 01/28/2010, and MRI of  the brain performed 02/08/2011 FINDINGS: There is no evidence of acute infarction, mass lesion, or intra- or extra-axial hemorrhage on CT. The posterior fossa, including the cerebellum, brainstem and fourth ventricle, is within normal limits. The third and lateral ventricles, and basal ganglia are unremarkable in appearance. The cerebral hemispheres are symmetric in appearance, with normal gray-white differentiation. No mass effect or midline shift is seen. There is no evidence of fracture; visualized osseous structures are unremarkable in appearance. The orbits are within normal limits. The paranasal sinuses and mastoid air cells are well-aerated. No significant soft tissue abnormalities are seen. IMPRESSION: Unremarkable noncontrast CT of the head. Electronically Signed   By: Roanna Raider M.D.   On: 09/10/2015 05:19   Dg Chest Port 1 View  09/10/2015  CLINICAL DATA:  55 year old female unresponsive patient. Probable overdose. EXAM: PORTABLE CHEST 1 VIEW COMPARISON:  Chest x-ray 01/19/2011. FINDINGS: Lung volumes are normal. No consolidative airspace disease. No pleural effusions. No pneumothorax. No pulmonary nodule or mass noted. Pulmonary vasculature and the cardiomediastinal silhouette are within normal limits. Atherosclerosis in the thoracic aorta. Radiopaque foreign bodies are projecting over the lower cervical region, presumably exterior to the patient. IMPRESSION: 1. No radiographic evidence of acute cardiopulmonary disease. 2. Atherosclerosis. Electronically Signed   By: Trudie Reed M.D.   On: 09/10/2015 00:11    EKG:   Orders placed or performed during the hospital encounter  of 09/09/15  . ED EKG  . ED EKG    IMPRESSION AND PLAN:   Unresponsiveness due to drug overdose. Hypokalemia Leukopenia Malnutrition Schizophrenia  Reccommodation:  The patient has unresponsiveness due to drug overdose. Neuro exam is unremarkable. CAT scan of head is unremarkable. Now she is alert awake  oriented 3. For hypokalemia, I will give potassium supplement. It looks like that the patient has chronic leukopenia. She can follow-up CBC as outpatient. Malnutrition is possible due to Anorexia nervosa, the patient needs psychiatric evaluation for further recommendation. The patient is medically stable. She doesn't meet criteria for medical admission at this time. Thank you for the consult. We will sign off.  All the records are reviewed and case discussed with Consulting provider. Management plans discussed with the patient, family and they are in agreement.  CODE STATUS: Full code  TOTAL TIME TAKING CARE OF THIS PATIENT: 50 minutes.    Shaune Pollack M.D on 09/10/2015 at 2:21 PM  Between 7am to 6pm - Pager - [phone]  After 6pm go to [email or web address] - password [email or web address]  [facility]  Office  [phone]  CC: Primary care Physician: No primary care provider on file.

## 2015-09-10 NOTE — ED Notes (Addendum)
Pt more alert, family at bedside (brother/guardian).  Brother states pt took Lorazepam, Seroquel, and Remeron but unsure of quantity.  Calvin, TTS, notified.

## 2015-09-10 NOTE — ED Notes (Signed)
Pt returned from CT °

## 2015-09-10 NOTE — ED Notes (Signed)
Dr.Clapacs at bedside  

## 2015-09-10 NOTE — [facility] Counselor (Signed)
Pt heavily sedated; unable to complete TTS assessment.

## 2015-09-10 NOTE — ED Notes (Signed)
Pt IVC and unable to sign discharge paperwork.

## 2015-09-10 NOTE — Consult Note (Signed)
[facility]   Reason for Consult:  Follow-up consult for this 54 year old woman with a recurrent severe mental illness brought in after a presumed overdose. We have a little more information to go on at this point Referring Physician:  Rip Harbour Patient Identification: [name] [name] MRN:  [id number] Principal Diagnosis: Sedative intoxication [facility]) Diagnosis:   Patient Active Problem List   Diagnosis Date Noted  . 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 Schizophrenia ([place]) [F20.9] 08/27/2015  . Anorexia nervosa [F50.00] 08/05/2015  . Underweight [R63.6] 08/05/2015  . Hypokalemia [E87.6] 08/05/2015  . Depression [F32.9] 08/05/2015  . 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 PALPITATIONS [R00.2] 02/07/2010  . HERPES ZOSTER [B02.9] 02/08/2008  . MIGRAINE HEADACHE [G43.909] 02/08/2008  . 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 IBD [K52.89] 02/08/2008  . CONSTIPATION, CHRONIC [K59.09] 02/08/2008  . ANEMIA, IRON DEFICIENCY [D50.9] 01/25/2008  . 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 OSTEOARTHRITIS [M19.90] 01/25/2008  . POLYARTHRALGIA [M25.50] 01/25/2008  . OSTEOPOROSIS [M81.0] 01/25/2008    Total Time spent with patient: 30 minutes  Subjective:   [name] [name] is a 54 y.o. female patient admitted with "I have been feeling bad, I have had some bad thoughts".  HPI:  This is a 54 year old woman well-known to the psychiatry service who is brought in today after her brother found her unconscious. Patient is more awake now but still is able to give only limited information. He states that she has been feeling worse lately and that she has had "bad thoughts". When I suggested she means thoughts about killing herself she rather passively acknowledges that. Patient is selective and answering questions. Flat admitted that she's taken an overdose but does state that she took some pills. Her brother gives a more clear recent  history. Yesterday they had gone to [facility] to try and get further psychiatric treatment. Last evening brother noticed her being much more quiet than what she usually is. Went to check on her and found with altered mental status. Evidence of taking a large amount of lorazepam as well as her Remeron and Seroquel. Had her brought into the hospital. Patient has been continuing to function very bad. Brother describes frequent episodes of chanting and bizarre behavior.  See previous note for social history. Lives with her brother. On history of living with her father who died recently which has certainly put her into a downward spin  Medical history: Patient remains at a dangerously low weight. She is obviously not eating properly. He has several other medical problems.  Past Psychiatric History: History of chronic recurrent mental illness. Really has both psychotic and depressive components to her illness. Refuses to eat but not out of any belief that she is overweight but rather out of what appears to be an obsessive) paranoia about food  Risk to Self: Suicidal Ideation: Yes-Currently Present Suicidal Intent: Yes-Currently Present Is patient at risk for suicide?: Yes Suicidal Plan?: Yes-Currently Present Specify Current Suicidal Plan: Medication Overdose Access to Means: Yes Specify Access to Suicidal Means: Overdose on medication What has been your use of drugs/alcohol within the last 12 months?: None Reported How many times?: 2 Other Self Harm Risks: Refusing to eat an medication overdose Triggers for Past Attempts: None known Intentional Self Injurious Behavior:  (Not eatting) Risk to Others: Homicidal Ideation: No Thoughts of Harm to Others: No Current Homicidal Intent: No Current Homicidal Plan: No Access to Homicidal Means: No Identified Victim: None Reported History of harm to others?: No Assessment  of Violence: None Noted Violent Behavior Description: None Reported Does patient have  access to weapons?: No Criminal Charges Pending?: No Does patient have a court date: No Prior Inpatient Therapy: Prior Inpatient Therapy: Yes Prior Therapy Dates: 01/2015 & 01/2013 Prior Therapy Facilty/Provider(s): [facility] Reason for Treatment: Suicidal & Depression Prior Outpatient Therapy: Prior Outpatient Therapy: Yes Prior Therapy Dates: Current Prior Therapy Facilty/Provider(s): [facility] Reason for Treatment: Depression Does patient have an ACCT team?: No Does patient have Intensive In-House Services?  : No Does patient have [facility] services? : No Does patient have P4[facility] services?: No  Past Medical History: No past medical history on file.  Past Surgical History  Procedure Laterality Date  . Breast biopsy Bilateral   . Colonoscopy  1993    crohn's disease   Family History: No family history on file. Family Psychiatric  History: History of depression Social History:  History  Alcohol Use No     History  Drug Use No    Social History   Social History  . Marital Status: Single    Spouse Name: N/A  . Number of Children: N/A  . Years of Education: N/A   Social History Main Topics  . Smoking status: Never Smoker   . Smokeless tobacco: Not on file  . Alcohol Use: No  . Drug Use: No  . Sexual Activity: Not on file   Other Topics Concern  . Not on file   Social History Narrative   Additional Social History:                          Allergies:   Allergies  Allergen Reactions  . Acyclovir     Other reaction(s): Other (See Comments) Orally, triggers a migraine  . Amoxicillin-Pot Clavulanate     REACTION: GI Upset  . Cefadroxil     Other reaction(s): Unknown  . Ciprofloxacin     REACTION: GI Upset.Also avelox and floxin  . Clarithromycin     REACTION: GI Upset  . Doxycycline     Other reaction(s): Unknown  . Erythromycin   . Levofloxacin   . Sulfamethoxazole-Trimethoprim     REACTION: itching  . Albumin (Human) Nausea And Vomiting  . Penicillins  Nausea Only    Labs:  Results for orders placed or performed during the hospital encounter of 09/09/15 (from the past 48 hour(s))  Urinalysis complete, with microscopic     Status: Abnormal   Collection Time: 09/09/15 11:29 PM  Result Value Ref Range   Color, Urine STRAW (A) YELLOW   APPearance CLEAR (A) CLEAR   Glucose, UA NEGATIVE NEGATIVE mg/dL   Bilirubin Urine NEGATIVE NEGATIVE   Ketones, ur NEGATIVE NEGATIVE mg/dL   Specific Gravity, Urine 1.003 (L) 1.005 - 1.030   Hgb urine dipstick NEGATIVE NEGATIVE   pH 8.0 5.0 - 8.0   Protein, ur NEGATIVE NEGATIVE mg/dL   Nitrite NEGATIVE NEGATIVE   Leukocytes, UA TRACE (A) NEGATIVE   RBC / HPF 0-5 0 - 5 RBC/hpf   WBC, UA 0-5 0 - 5 WBC/hpf   Bacteria, UA NONE SEEN NONE SEEN   Squamous Epithelial / LPF NONE SEEN NONE SEEN  Comprehensive metabolic panel     Status: Abnormal   Collection Time: 09/09/15 11:29 PM  Result Value Ref Range   Sodium 136 135 - 145 mmol/L   Potassium 3.2 (L) 3.5 - 5.1 mmol/L   Chloride 101 101 - 111 mmol/L   CO2 31 22 - 32 mmol/L  Glucose, Bld 91 65 - 99 mg/dL   BUN 7 6 - 20 mg/dL   Creatinine, Ser 0.63 0.44 - 1.00 mg/dL   Calcium 9.4 8.9 - 10.3 mg/dL   Total Protein 7.0 6.5 - 8.1 g/dL   Albumin 4.4 3.5 - 5.0 g/dL   AST 25 15 - 41 U/L   ALT 18 14 - 54 U/L   Alkaline Phosphatase 58 38 - 126 U/L   Total Bilirubin 0.6 0.3 - 1.2 mg/dL   GFR calc non Af Amer >60 >60 mL/min   GFR calc Af Amer >60 >60 mL/min    Comment: (NOTE) The eGFR has been calculated using the CKD EPI equation. This calculation has not been validated in all clinical situations. eGFR's persistently <60 mL/min signify possible Chronic Kidney Disease.    Anion gap 4 (L) 5 - 15  Ethanol     Status: None   Collection Time: 09/09/15 11:29 PM  Result Value Ref Range   Alcohol, Ethyl (B) <5 <5 mg/dL    Comment:        LOWEST DETECTABLE LIMIT FOR SERUM ALCOHOL IS 5 mg/dL FOR MEDICAL PURPOSES ONLY   Lipase, blood     Status: None    Collection Time: 09/09/15 11:29 PM  Result Value Ref Range   Lipase 31 11 - 51 U/L    Comment: Please note change in reference range.  Troponin I     Status: None   Collection Time: 09/09/15 11:29 PM  Result Value Ref Range   Troponin I <0.03 <0.031 ng/mL    Comment:        NO INDICATION OF MYOCARDIAL INJURY.   Salicylate level     Status: None   Collection Time: 09/09/15 11:29 PM  Result Value Ref Range   Salicylate Lvl <7.3 2.8 - 30.0 mg/dL  Acetaminophen level     Status: Abnormal   Collection Time: 09/09/15 11:29 PM  Result Value Ref Range   Acetaminophen (Tylenol), Serum <10 (L) 10 - 30 ug/mL    Comment:        THERAPEUTIC CONCENTRATIONS VARY SIGNIFICANTLY. A RANGE OF 10-30 ug/mL MAY BE AN EFFECTIVE CONCENTRATION FOR MANY PATIENTS. HOWEVER, SOME ARE BEST TREATED AT CONCENTRATIONS OUTSIDE THIS RANGE. ACETAMINOPHEN CONCENTRATIONS >150 ug/mL AT 4 HOURS AFTER INGESTION AND >50 ug/mL AT 12 HOURS AFTER INGESTION ARE OFTEN ASSOCIATED WITH TOXIC REACTIONS.   CBC with Differential     Status: Abnormal   Collection Time: 09/09/15 11:29 PM  Result Value Ref Range   WBC 2.7 (L) 3.6 - 11.0 K/uL   RBC 4.29 3.80 - 5.20 MIL/uL   Hemoglobin 14.1 12.0 - 16.0 g/dL   HCT 40.8 35.0 - 47.0 %   MCV 95.2 80.0 - 100.0 fL   MCH 32.8 26.0 - 34.0 pg   MCHC 34.5 32.0 - 36.0 g/dL   RDW 13.5 11.5 - 14.5 %   Platelets 191 150 - 440 K/uL   Neutrophils Relative % 45 %   Neutro Abs 1.2 (L) 1.4 - 6.5 K/uL   Lymphocytes Relative 42 %   Lymphs Abs 1.1 1.0 - 3.6 K/uL   Monocytes Relative 9 %   Monocytes Absolute 0.2 0.2 - 0.9 K/uL   Eosinophils Relative 3 %   Eosinophils Absolute 0.1 0 - 0.7 K/uL   Basophils Relative 1 %   Basophils Absolute 0.0 0 - 0.1 K/uL  Urine Drug Screen, Qualitative     Status: Abnormal   Collection Time: 09/09/15 11:29 PM  Result  Value Ref Range   Tricyclic, Ur Screen POSITIVE (A) NONE DETECTED   Amphetamines, Ur Screen NONE DETECTED NONE DETECTED   MDMA  (Ecstasy)Ur Screen NONE DETECTED NONE DETECTED   Cocaine Metabolite,Ur [place] NONE DETECTED NONE DETECTED   Opiate, Ur Screen NONE DETECTED NONE DETECTED   Phencyclidine (PCP) Ur S NONE DETECTED NONE DETECTED   Cannabinoid 50 Ng, Ur  NONE DETECTED NONE DETECTED   Barbiturates, Ur Screen NONE DETECTED NONE DETECTED   Benzodiazepine, Ur Scrn POSITIVE (A) NONE DETECTED   Methadone Scn, Ur NONE DETECTED NONE DETECTED    Comment: (NOTE) 378  Tricyclics, urine               Cutoff 1000 ng/mL 200  Amphetamines, urine             Cutoff 1000 ng/mL 300  MDMA (Ecstasy), urine           Cutoff 500 ng/mL 400  Cocaine Metabolite, urine       Cutoff 300 ng/mL 500  Opiate, urine                   Cutoff 300 ng/mL 600  Phencyclidine (PCP), urine      Cutoff 25 ng/mL 700  Cannabinoid, urine              Cutoff 50 ng/mL 800  Barbiturates, urine             Cutoff 200 ng/mL 900  Benzodiazepine, urine           Cutoff 200 ng/mL 1000 Methadone, urine                Cutoff 300 ng/mL 1100 1200 The urine drug screen provides only a preliminary, unconfirmed 1300 analytical test result and should not be used for non-medical 1400 purposes. Clinical consideration and professional judgment should 1500 be applied to any positive drug screen result due to possible 1600 interfering substances. A more specific alternate chemical method 1700 must be used in order to obtain a confirmed analytical result.  1800 Gas chromato graphy / mass spectrometry (GC/MS) is the preferred 1900 confirmatory method.     No current facility-administered medications for this encounter.   Current Outpatient Prescriptions  Medication Sig Dispense Refill  . LORazepam (ATIVAN) 1 MG tablet Take 0.5 mg by mouth 2 (two) times daily.    Marland Kitchen LORazepam (ATIVAN) 1 MG tablet Take 1 mg by mouth at bedtime.    . megestrol (MEGACE) 40 MG tablet Take 40 mg by mouth at bedtime.     . metoprolol succinate (TOPROL-XL) 25 MG 24 hr tablet Take 1 tablet  (25 mg total) by mouth daily. 90 tablet 3  . mirtazapine (REMERON) 15 MG tablet Take 15 mg by mouth at bedtime.    . mirtazapine (REMERON) 30 MG tablet Take 30 mg by mouth at bedtime.    Marland Kitchen QUEtiapine (SEROQUEL XR) 200 MG 24 hr tablet Take 200 mg by mouth at bedtime.    Marland Kitchen QUEtiapine (SEROQUEL XR) 50 MG TB24 24 hr tablet Take 50 mg by mouth at bedtime.      Musculoskeletal: Strength & Muscle Tone: decreased Gait & Station: unable to stand Patient leans: N/A  Psychiatric Specialty Exam: Review of Systems  Unable to perform ROS: mental acuity    Blood pressure 132/86, pulse 89, temperature 95.7 F (35.4 C), temperature source Rectal, resp. rate 20, weight 35.381 kg (78 lb), SpO2 100 %.Body mass index is 12.98 kg/(m^2).  General Appearance:  Disheveled  Eye Contact::  Minimal  Speech:  Slurred  Volume:  Decreased  Mood:  Dysphoric  Affect:  Depressed  Thought Process:  Loose  Orientation:  Full (Time, Place, and Person)  Thought Content:  Rumination  Suicidal Thoughts:  Yes.  with intent/plan  Homicidal Thoughts:  No  Memory:  Immediate;   Fair Recent;   Poor Remote;   Poor  Judgement:  Impaired  Insight:  Lacking  Psychomotor Activity:  Decreased  Concentration:  Poor  Recall:  [place] of Knowledge:Fair  Language: Fair  Akathisia:  No  Handed:  Right  AIMS (if indicated):     Assets:  Social Support  ADL's:  Impaired  Cognition: Impaired,  Moderate  Sleep:      Treatment Plan Summary: Daily contact with patient to assess and evaluate symptoms and progress in treatment, Medication management and Plan Patient clearly needs hospitalization and her primary underlying problem is clearly her mental illness. Labs reviewed. She had a little bit of a low potassium and has a low white count but there is nothing else on her labs. Looks like it indicates severe acute metabolic problems. Her blood pressure and pulse are currently stable although she probably is dehydrated. Internal  medicine has consulted and does not feel that she requires admission to the medical unit. I am going to go ahead and put in orders to admit this patient to the psychiatry ward. In my opinion ECT would be an appropriate treatment for what I think is probably a recurrent psychotic depression. Mention of this to the brother. For now continue outpatient medicines.  Disposition: Recommend psychiatric Inpatient admission when medically cleared. Discussed crisis plan, support from social network, calling 911, coming to the Emergency Department, and calling Suicide Hotline.  John Clapacs 09/10/2015 4:47 PM

## 2015-09-10 NOTE — ED Notes (Signed)
Bedside report recvd from [place], [place]. Pt brought in unresponsive with reports of taking an unknown amount of ativan. Pt remains asleep throughout the night with vss. Pt is IVC at this time. Pt was placed on posey bed alarm pad for safety measures.  vss at this time, continue to monitor,

## 2015-09-11 ENCOUNTER — Encounter: Payer: Self-pay | Admitting: Psychiatry

## 2015-09-11 DIAGNOSIS — R627 Adult failure to thrive: Secondary | ICD-10-CM | POA: Diagnosis present

## 2015-09-11 DIAGNOSIS — F333 Major depressive disorder, recurrent, severe with psychotic symptoms: Principal | ICD-10-CM

## 2015-09-11 MED ORDER — DOCUSATE SODIUM 100 MG PO CAPS
100.0000 mg | ORAL_CAPSULE | Freq: Two times a day (BID) | ORAL | Status: DC
  Administered 2015-09-11 – 2015-09-21 (×21): 100 mg via ORAL
  Filled 2015-09-11 (×20): qty 1

## 2015-09-11 MED ORDER — TAB-A-VITE/IRON PO TABS
1.0000 | ORAL_TABLET | Freq: Every day | ORAL | Status: DC
  Filled 2015-09-11: qty 1

## 2015-09-11 MED ORDER — PRENATAL MULTIVITAMIN CH
1.0000 | ORAL_TABLET | Freq: Every day | ORAL | Status: DC
  Administered 2015-09-12 – 2015-09-21 (×10): 1 via ORAL
  Filled 2015-09-11 (×21): qty 1

## 2015-09-11 MED ORDER — SENNA 8.6 MG PO TABS
1.0000 | ORAL_TABLET | Freq: Every day | ORAL | Status: DC
  Administered 2015-09-11 – 2015-09-21 (×11): 8.6 mg via ORAL
  Filled 2015-09-11 (×11): qty 1

## 2015-09-11 MED ORDER — MEGESTROL ACETATE 40 MG PO TABS
40.0000 mg | ORAL_TABLET | Freq: Every day | ORAL | Status: DC
  Administered 2015-09-12 – 2015-09-21 (×10): 40 mg via ORAL
  Filled 2015-09-11 (×12): qty 1

## 2015-09-11 NOTE — Progress Notes (Signed)
Patient with depressed affect and withdrawn behavior. No SI/HI at this time. Low energy and low interest. Minimal interaction with peers. Appropriate when staff initiates interaction. Fair appetite, meals and po fluids monitored and recorded. MD into assess and evaluate and new orders placed. Patient ambulates with slow and steady gait. Walker available for patient use, patient noted to ambulate without walker at times. Remains on fall protocol. Aware to call staff when getting up with call bell. Safety maintained at this time.

## 2015-09-11 NOTE — [facility] Group Notes (Signed)
[facility] LCSW Group Therapy  09/11/2015 5:05 PM  Type of Therapy:  Group Therapy  Participation Level:  Did Not Attend   Dayami Taitt T, MSW, LCSWA 09/11/2015, 5:05 PM  

## 2015-09-11 NOTE — [facility] Suicide Risk Assessment (Addendum)
[facility] Admission Suicide Risk Assessment   Nursing information obtained from:    Demographic factors:    Current Mental Status:    Loss Factors:    Historical Factors:    Risk Reduction Factors:    Total Time spent with patient: 1 hour Principal Problem: Severe recurrent major depression with psychotic features [facility]) Diagnosis:   Patient Active Problem List   Diagnosis Date Noted  . Failure to thrive in adult [R62.7] 09/11/2015  . 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 Severe recurrent major depression with psychotic features (HCC) [F33.3] 09/10/2015  . Schizophrenia (HCC) [F20.9] 08/27/2015  . Anorexia nervosa [F50.00] 08/05/2015  . Hypokalemia [E87.6] 08/05/2015  . 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 PALPITATIONS [R00.2] 02/07/2010  . HERPES ZOSTER [B02.9] 02/08/2008  . MIGRAINE HEADACHE [G43.909] 02/08/2008  . 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 IBD [K52.89] 02/08/2008  . CONSTIPATION, CHRONIC [K59.09] 02/08/2008  . ANEMIA, IRON DEFICIENCY [D50.9] 01/25/2008  . 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 OSTEOARTHRITIS [M19.90] 01/25/2008  . POLYARTHRALGIA [M25.50] 01/25/2008  . OSTEOPOROSIS [M81.0] 01/25/2008     Continued Clinical Symptoms:  Alcohol Use Disorder Identification Test Final Score (AUDIT): 0 The "Alcohol Use Disorders Identification Test", Guidelines for Use in Primary Care, Second Edition.  World Science writer [facility]). Score between 0-7:  no or low risk or alcohol related problems. Score between 8-15:  moderate risk of alcohol related problems. Score between 16-19:  high risk of alcohol related problems. Score 20 or above:  warrants further diagnostic evaluation for alcohol dependence and treatment.   CLINICAL FACTORS:   Depression:   Delusional Severe Currently Psychotic Previous Psychiatric Diagnoses and Treatments Medical Diagnoses and Treatments/Surgeries   Musculoskeletal: Strength & Muscle  Tone: within normal limits Gait & Station: normal Patient leans: N/A  Psychiatric Specialty Exam: I reviewed physical exam performed in the emergency room and agree with the findings. Physical Exam  Nursing note and vitals reviewed.   Review of Systems  Constitutional: Positive for weight loss.  Gastrointestinal: Positive for constipation.  All other systems reviewed and are negative.   Blood pressure 105/72, pulse 105, temperature 97.9 F (36.6 C), temperature source Oral, resp. rate 20, height  (1.6 m), weight 29.937 kg (66 lb), SpO2 97 %.Body mass index is 11.69 kg/(m^2).  General Appearance: Casual  Eye Contact::  Minimal  Speech:  Slow  Volume:  Decreased  Mood:  Anxious and Depressed  Affect:  Flat  Thought Process:  Linear  Orientation:  Full (Time, Place, and Person)  Thought Content:  Delusions, Hallucinations: Auditory and Paranoid Ideation  Suicidal Thoughts:  Yes.  with intent/plan  Homicidal Thoughts:  No  Memory:  Immediate;   Fair Recent;   Fair Remote;   Fair  Judgement:  Fair  Insight:  Fair  Psychomotor Activity:  Decreased  Concentration:  Fair  Recall:  [employer] of Knowledge:Fair  Language: Fair  Akathisia:  No  Handed:  Right  AIMS (if indicated):     Assets:  Social Support  Sleep:  Number of Hours: 5.3  Cognition: WNL  ADL's:  Intact     COGNITIVE FEATURES THAT CONTRIBUTE TO RISK:  None    SUICIDE RISK:   Severe:  Frequent, intense, and enduring suicidal ideation, specific plan, no subjective intent, but some objective markers of intent (i.e., choice of lethal method), the method is accessible, some limited preparatory behavior, evidence of impaired self-control, severe dysphoria/symptomatology, multiple risk factors present, and few if any protective  factors, particularly a lack of social support.  PLAN OF CARE: Hospital admission, medication management, ECT consult, discharge planning.  Medical Decision Making:  New problem, with  additional work up planned, Review of Psycho-Social Stressors (1), Review or order clinical lab tests (1), Review of Medication Regimen & Side Effects (2) and Review of New Medication or Change in Dosage (2)   [name] [name] is a 54 year old female with a history of psychotic depression and failure to thrive admitted after suicide attempt by medication overdose in the context of major loss.  1. Suicidal ideation. Patient still suicidal. Secondary school teacher at bedside.  2. Mood and psychosis. She was restarted on Seroquel and Remeron for depression and mood stabilization.  3. Constipation. She is on bowel regimen.  4. Failure to thrive. He weighs 66 pounds only. During her previous admission she weighed 73 pounds. Her normal weight is 90 pounds. We will start Megace and Ensure Plus.  5. Tachycardia. In the past she was given metoprolol for tachycardia. Her heart rate is not elevated. We'll monitor.   6. ECT consult. Will ask Dr. [name] to evaluate this patient for ECT.  7. Disposition. She will return home with her brother. She will follow up with [facility]. This is her fourth admission this year. She may qualify for ACT team.   I certify that inpatient services furnished can reasonably be expected to improve the patient's condition.   Helma Argyle 09/11/2015, 12:17 PM

## 2015-09-11 NOTE — Plan of Care (Addendum)
Problem: Consults Goal: [facility] General Treatment Patient Education Outcome: Not Progressing General tx education completed, pt working on accomplishing the goals.

## 2015-09-11 NOTE — Progress Notes (Signed)
Recreation Therapy Notes  INPATIENT RECREATION THERAPY ASSESSMENT  Patient Details Name: [name] [name] MRN: [id number] DOB: 1961-07-06 Today's Date: 09/11/2015  Patient Stressors: Family, Death, Friends, Other (Comment) (Stressful relationship with brother; father passed away; lack of friends; brother is her legal guardian)  Coping Skills:   Isolate, Avoidance, Exercise, Art/Dance, Music, Other (Comment) (Relaxation techniques)  Personal Challenges: Anger, Communication, Concentration, Decision-Making, Expressing Yourself, Problem-Solving, Relationships, Self-Esteem/Confidence, Social Interaction, Stress Management, Trusting Others  Leisure Interests (2+):  Individual - Other (Comment) (Go out to eat, be outside)  Awareness of Community Resources:  Yes  Community Resources:  Library  Current Use: Yes  If no, Barriers?:    Patient Strengths:  "I don't feel too good about my self-esteem"  Patient Identified Areas of Improvement:  To get out of here  Current Recreation Participation:  Can't remember  Patient Goal for Hospitalization:  To get a new roommate  [place] of Residence:  [place] of Residence:  [place]   Current SI (including self-harm):  No  Current HI:  No  Consent to Intern Participation: N/A   Jacquelynn Cree, LRT/CTRS 09/11/2015, 2:54 PM

## 2015-09-11 NOTE — Plan of Care (Signed)
Problem: Consults Goal: Suicide Risk Patient Education (See Patient Education module for education specifics)  Outcome: Progressing No SI/HI at this time.      

## 2015-09-11 NOTE — Progress Notes (Signed)
Recreation Therapy Notes  Date: 10.28.16 Time: 3:00 pm Location: Craft Room  Group Topic: Communication, Problem Solving, Teamwork  Goal Area(s) Addresses:  Patient will effectively work with peers towards shared goal. Patient will identify skills used to make activity successful. Patient will identify benefit of using group skills effectively post d/c.  Behavioral Response: Did not attend  Intervention: Pipe Cleaner Tower  Activity: Patients were given 15 pipe cleanser and instructed to build the tallest free standing tower. Patients were given 2 minutes to strategize. After approximately 5 minutes of building, patients were instructed to put their dominant hand behind their back. After approximately 3 minutes of building, patients were instructed to stop talking to each other.    Education: LRT educated patients on how communication, problem solving, and teamwork goes in to building a healthy support system.  Education Outcome: Patient did not attend group.  Clinical Observations/Feedback: Patient did not attend group.  Tiburcio Linder M, LRT/CTRS 09/11/2015 4:17 PM 

## 2015-09-11 NOTE — Progress Notes (Signed)
Patient ID: [name] [name], female   DOB: 08-08-1961, 54 y.o.   MRN: [id number] PER STATE REGULATIONS 482.30  THIS CHART WAS REVIEWED FOR MEDICAL NECESSITY WITH RESPECT TO THE PATIENT'S ADMISSION/DURATION OF STAY.  NEXT REVIEW DATE:09/14/15  Loura Halt, RN, BSN CASE MANAGER

## 2015-09-11 NOTE — Progress Notes (Addendum)
Initial Nutrition Assessment     INTERVENTION:  Meals and snacks: Cater to pt preferences. Medical Nutrition Supplement Therapy: Agree with ensure for added nutrition Nutrition related lab values: recommend checking mg and phosphorus, K in am and supplementing if low. Recommend supplement K at this time Coordination of care: Recommend referring pt to RD that specializes in eating disorders for further counseling. Discussed with RN, Victorino Dike.    NUTRITION DIAGNOSIS:   Inadequate oral intake related to  (eating disorder and severe depression) as evidenced by  (BMI of 11).    GOAL:   Patient will meet greater than or equal to 90% of their needs    MONITOR:    (Energy intake, Anthropometric)  REASON FOR ASSESSMENT:   Consult Poor PO  ASSESSMENT:      Pt admitted with severe depression, weight loss, sucide attempt  History reviewed. No pertinent past medical history.   Current Nutrition: noted per I and O sheet intake of 50% of meals     Scheduled Medications:  . docusate sodium  100 mg Oral BID  . feeding supplement (ENSURE ENLIVE)  237 mL Oral TID BM  . megestrol  40 mg Oral Daily  . mirtazapine  45 mg Oral QHS  . prenatal multivitamin  1 tablet Oral Q1200  . QUEtiapine  200 mg Oral QHS  . senna  1 tablet Oral Daily       Electrolyte/Renal Profile and Glucose Profile:   Recent Labs Lab 09/09/15 2329  NA 136  K 3.2*  CL 101  CO2 31  BUN 7  CREATININE 0.63  CALCIUM 9.4  GLUCOSE 91   Protein Profile:  Recent Labs Lab 09/09/15 2329  ALBUMIN 4.4    Gastrointestinal Profile: Last BM: WDL   Nutrition-Focused Physical Exam Findings:  Unable to complete Nutrition-Focused physical exam at this time.     Weight Change: noted 27% wt loss in the last 6 months    Diet Order:  Diet regular Room service appropriate?: Yes; Fluid consistency:: Thin  Skin:   reviewed   Height:   Ht Readings from Last 1 Encounters:  09/10/15  (1.6 m)     Weight:   Wt Readings from Last 1 Encounters:  09/10/15 66 lb (29.937 kg)        BMI:  Body mass index is 11.69 kg/(m^2).  Estimated Nutritional Needs:   Kcal:  1200-1400 kcals/d   Protein:  (0.8-1.0 g/kg) 42-52 g/d (Using IBW of 52kg)  Fluid:     EDUCATION NEEDS:   No education needs identified at this time  HIGH Care Level  Rozina Pointer B. Freida Busman, RD, LDN [phone] (pager)

## 2015-09-11 NOTE — Tx Team (Signed)
Initial Interdisciplinary Treatment Plan   PATIENT STRESSORS: Financial difficulties Health problems   PATIENT STRENGTHS: Average or above average intelligence General fund of knowledge Supportive family/friends   PROBLEM LIST: Problem List/Patient Goals Date to be addressed Date deferred Reason deferred Estimated date of resolution  Suicide Ideation      Depression      Anorexia                                           DISCHARGE CRITERIA:  Adequate post-discharge living arrangements Improved stabilization in mood, thinking, and/or behavior Motivation to continue treatment in a less acute level of care  PRELIMINARY DISCHARGE PLAN: Outpatient therapy Participate in family therapy Return to previous living arrangement  PATIENT/FAMIILY INVOLVEMENT: This treatment plan has been presented to and reviewed with the patient, [name] [name]. The patient have been given the opportunity to ask questions and make suggestions.  Justina A Okonkwo 09/11/2015, 12:30 AM
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
## 2015-09-11 NOTE — Progress Notes (Signed)
54 yr female who presents IVC in no acute distress for the treatment of SI and Depression. Pt overdosed on unknown amount of drugs and found unresponsive at home. Pt is alert and oriented to person and place, disoriented to situation. Flat and depressed mood. Vital sign is stable, pt somewhat impulsive and requiring frequent redirection. Pt currently denies any SI/HI/VAH. Pt searched for contraband, non found. Skin assessment reveals stage one pressure ulcer to the mid coccyx, blister to the left pedal, reddened area to the right ankle. Bruises to the left elbow from IV site, skin dry and scaly skin.  Pt unable to sign belonging sheet and admission docs due to mental status. Pt's upper and lower extremity weak, gait very unsteady, high fall risk protocol initiated, pt being monitored on a 1:1 bases for not following instructions and threatening to get up from bed without assistance. Pt complained of abdominal pain 4/10, refused prn for the concern; will continue to monitor closely for safety.

## 2015-09-11 NOTE — Progress Notes (Signed)
Phoned PT and Dietician to notify of consult orders.

## 2015-09-11 NOTE — [facility] Group Notes (Signed)
[facility] Group Notes:  (Nursing/MHT/Case Management/Adjunct)  Date:  09/11/2015  Time:  10:28 PM  Type of Therapy:  Nursing Wrap-up Group  Participation Level:  Did Not Attend  Participation Quality:  N/A  Affect:  N/A  Cognitive:  N/A  Insight:  None  Engagement in Group:  None  Modes of Intervention:  Activity  Summary of Progress/Problems:  Tomasita Morrow 09/11/2015, 10:28 PM

## 2015-09-11 NOTE — [facility] Group Notes (Signed)
[facility] LCSW Aftercare Discharge Planning Group Note   09/11/2015 3:25 PM  Participation Quality:  Did not attend.   Lawan Nanez L Alaiyah Bollman MSW, LCSWA     

## 2015-09-12 ENCOUNTER — Encounter: Payer: Self-pay | Admitting: Internal Medicine

## 2015-09-12 LAB — PHOSPHORUS: Phosphorus: 2 mg/dL — ABNORMAL LOW (ref 2.5–4.6)

## 2015-09-12 LAB — TSH: TSH: 2.276 u[IU]/mL (ref 0.350–4.500)

## 2015-09-12 LAB — MAGNESIUM: MAGNESIUM: 1.7 mg/dL (ref 1.7–2.4)

## 2015-09-12 LAB — POTASSIUM: Potassium: 3.8 mmol/L (ref 3.5–5.1)

## 2015-09-12 MED ORDER — POTASSIUM & SODIUM PHOSPHATES 280-160-250 MG PO PACK
1.0000 | PACK | Freq: Three times a day (TID) | ORAL | Status: AC
  Administered 2015-09-12 – 2015-09-13 (×5): 1 via ORAL
  Filled 2015-09-12 (×6): qty 1

## 2015-09-12 MED ORDER — LORAZEPAM 0.5 MG PO TABS
0.5000 mg | ORAL_TABLET | Freq: Two times a day (BID) | ORAL | Status: DC
  Administered 2015-09-12 – 2015-09-14 (×4): 0.5 mg via ORAL
  Filled 2015-09-12 (×4): qty 1

## 2015-09-12 MED ORDER — METOPROLOL TARTRATE 25 MG PO TABS
25.0000 mg | ORAL_TABLET | Freq: Every day | ORAL | Status: DC
  Administered 2015-09-12 – 2015-09-21 (×10): 25 mg via ORAL
  Filled 2015-09-12 (×10): qty 1

## 2015-09-12 MED ORDER — OXYMETAZOLINE HCL 0.05 % NA SOLN
1.0000 | Freq: Two times a day (BID) | NASAL | Status: DC
Start: 2015-09-12 — End: 2015-09-21
  Administered 2015-09-12 – 2015-09-21 (×18): 1 via NASAL
  Filled 2015-09-12 (×2): qty 15

## 2015-09-12 NOTE — Consult Note (Signed)
[facility]   PATIENT NAME: [name] [name]    MR#:  [id number]  DATE OF BIRTH:  07-07-1961  DATE OF ADMISSION:  09/10/2015  PRIMARY CARE PHYSICIAN: No primary care provider on file.   CONSULT REQUESTING/REFERRING PHYSICIAN: Dr. Mayford Knife of psychiatry  REASON FOR CONSULT: Hypophosphatemia  CHIEF COMPLAINT:  No chief complaint on file.  anorexia  HISTORY OF PRESENT ILLNESS:  [name] [name]  is a 54 y.o. female with a known history of anorexia, ulcerative colitis, osteoporosis presented to the hospital and admitted to behavioral health unit for severe anorexia. She was initially found to have mild hypokalemia but seems to have resolved. But today on routine labs her phosphate level was found to be low at 2 mg/dl and hospitalist team has been consult it. She has no thyroid/parathyroid problems, diarrhea. Not on any diuretics. No history of hypophosphatemia. No history of vitamin D deficiency. Calcium level was normal during recent blood work. No palpitations, chest pain. She does complain of generalized weakness. Has had poor oral intake.  PAST MEDICAL HISTORY:   Past Medical History  Diagnosis Date  . Ulcerative colitis (HCC)   . Osteoporosis   . Malnutrition (HCC)   . Alcoholism in remission (HCC)     PAST SURGICAL HISTOIRY:   Past Surgical History  Procedure Laterality Date  . Breast biopsy Bilateral   . Colonoscopy  1993    crohn's disease    SOCIAL HISTORY:   Social History  Substance Use Topics  . Smoking status: Never Smoker   . Smokeless tobacco: Not on file  . Alcohol Use: No    FAMILY HISTORY:   Family History  Problem Relation Age of Onset  . Colon cancer    . Diabetes    . Hypothyroidism      DRUG ALLERGIES:   Allergies  Allergen Reactions  . Acyclovir     Other reaction(s): Other (See Comments) Orally, triggers a migraine  . Amoxicillin-Pot Clavulanate     REACTION: GI Upset  . Cefadroxil      Other reaction(s): Unknown  . Ciprofloxacin     REACTION: GI Upset.Also avelox and floxin  . Clarithromycin     REACTION: GI Upset  . Doxycycline     Other reaction(s): Unknown  . Erythromycin   . Levofloxacin   . Sulfamethoxazole-Trimethoprim     REACTION: itching  . Albumin (Human) Nausea And Vomiting  . Penicillins Nausea Only    REVIEW OF SYSTEMS:   ROS  CONSTITUTIONAL: No fever . Complains of fatigue and weakness EYES: No blurred or double vision.  EARS, NOSE, AND THROAT: No tinnitus or ear pain.  RESPIRATORY: No cough, shortness of breath, wheezing or hemoptysis.  CARDIOVASCULAR: No chest pain, orthopnea, edema.  GASTROINTESTINAL: No nausea, vomiting, diarrhea or abdominal pain.  GENITOURINARY: No dysuria, hematuria.  ENDOCRINE: No polyuria, nocturia,  HEMATOLOGY: No anemia, easy bruising or bleeding SKIN: No rash or lesion. MUSCULOSKELETAL: No joint pain or arthritis.   NEUROLOGIC: No tingling, numbness, weakness.  PSYCHIATRY: No anxiety or depression.   MEDICATIONS AT HOME:   Prior to Admission medications   Medication Sig Start Date End Date Taking? Authorizing Provider  LORazepam (ATIVAN) 1 MG tablet Take 0.5 mg by mouth 2 (two) times daily.    Historical Provider, MD  LORazepam (ATIVAN) 1 MG tablet Take 1 mg by mouth at bedtime.    Historical Provider, MD  megestrol (MEGACE) 40 MG tablet Take 40 mg by mouth at bedtime.  Historical Provider, MD  metoprolol succinate (TOPROL-XL) 25 MG 24 hr tablet Take 1 tablet (25 mg total) by mouth daily. 06/25/15   Schuyler Amor, MD  mirtazapine (REMERON) 15 MG tablet Take 15 mg by mouth at bedtime.    Historical Provider, MD  mirtazapine (REMERON) 30 MG tablet Take 30 mg by mouth at bedtime.    Historical Provider, MD  QUEtiapine (SEROQUEL XR) 200 MG 24 hr tablet Take 200 mg by mouth at bedtime.    Historical Provider, MD  QUEtiapine (SEROQUEL XR) 50 MG TB24 24 hr tablet Take 50 mg by mouth at bedtime.    Historical  Provider, MD      VITAL SIGNS:  Blood pressure 108/63, pulse 128, temperature 97.6 F (36.4 C), temperature source Oral, resp. rate 20, height  (1.6 m), weight 29.937 kg (66 lb), SpO2 97 %.  PHYSICAL EXAMINATION:  GENERAL:  54 y.o.-year-old patient lying in the bed with no acute distress. Thin EYES: Pupils equal, round, reactive to light and accommodation. No scleral icterus. Extraocular muscles intact.  HEENT: Head atraumatic, normocephalic. Oropharynx and nasopharynx clear.  NECK:  Supple, no jugular venous distention. No thyroid enlargement, no tenderness.  LUNGS: Normal breath sounds bilaterally, no wheezing, rales,rhonchi or crepitation. No use of accessory muscles of respiration.  CARDIOVASCULAR: S1, S2 normal. No murmurs, rubs, or gallops.  ABDOMEN: Soft, nontender, nondistended. Bowel sounds present. No organomegaly or mass.  EXTREMITIES: No pedal edema, cyanosis, or clubbing.  NEUROLOGIC: Cranial nerves II through XII are intact. Muscle strength 5/5 in all extremities. Sensation intact. Gait not checked.  PSYCHIATRIC: The patient is alert and oriented x 3. Flat affect. SKIN: No obvious rash, lesion, or ulcer.   LABORATORY PANEL:   CBC  Recent Labs Lab 09/09/15 2329  WBC 2.7*  HGB 14.1  HCT 40.8  PLT 191   ------------------------------------------------------------------------------------------------------------------  Chemistries   Recent Labs Lab 09/09/15 2329 09/12/15 0950  NA 136  --   K 3.2* 3.8  CL 101  --   CO2 31  --   GLUCOSE 91  --   BUN 7  --   CREATININE 0.63  --   CALCIUM 9.4  --   MG  --  1.7  AST 25  --   ALT 18  --   ALKPHOS 58  --   BILITOT 0.6  --    ------------------------------------------------------------------------------------------------------------------  Cardiac Enzymes  Recent Labs Lab 09/09/15 2329  TROPONINI <0.03    ------------------------------------------------------------------------------------------------------------------  RADIOLOGY:  No results found.  EKG:   Orders placed or performed during the hospital encounter of 09/09/15  . ED EKG  . ED EKG    IMPRESSION AND PLAN:   * Mild hypophosphatemia likely due to decreased oral intake. Will replace orally 3 times a day. Most patients at level of 2 are asymptomatic. We'll check TSH, PTH, calcium, vitamin D levels. Repeat phosphate level in the morning.  * Anorexia  * Hypokalemia resolved.  All the records are reviewed and case discussed with Consulting provider. Management plans discussed with the patient, and  in agreement.  TOTAL TIME TAKING CARE OF THIS PATIENT: 40 minutes.    Milagros Loll R M.D on 09/12/2015 at 5:44 PM  Between 7am to 6pm - Pager - [phone]  After 6pm go to [email or web address] - password [email or web address]  [facility]  Office  [phone]  CC: Primary care Physician: No primary care provider on file.     Note: This dictation was prepared with Dragon dictation along  with smaller phrase technology. Any transcriptional errors that result from this process are unintentional.

## 2015-09-12 NOTE — Plan of Care (Signed)
Problem: Diagnosis: Increased Risk For Suicide Attempt Goal: LTG-Patient Will Show Positive Response to Medication LTG (by discharge) : Patient will show positive response to medication and will participate in the development of the discharge plan.  Outcome: Progressing Pt progressing toward achieving this goal.

## 2015-09-12 NOTE — Progress Notes (Addendum)
[facility] MD Progress Note  09/12/2015 3:58 PM [name] [name]  MRN:  [id number] Subjective:  Patient discussed wanting to deal with the bad things that are happening where she is living. However she was not specific about what they were. She denied any suicidal ideation at this time. She was somewhat proactive today and trying to get various medication issues addressed. For example she stated she wanted to make sure she was prescribed her Lopressor as well as her lorazepam. Staff researcher prior doses and it does appear she was prescribed lorazepam 0.5 mg twice daily and Lopressor 25 mg daily. Out and about and in the dining room at time of lunch. She seemed to deny any physical problems at this time but then seemed to be tangential and asked this writer if he believed in "the [place]." Principal Problem: Severe recurrent major depression with psychotic features [facility]) Diagnosis:   Patient Active Problem List   Diagnosis Date Noted  . Failure to thrive in adult [R62.7] 09/11/2015  . 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 Severe recurrent major depression with psychotic features (HCC) [F33.3] 09/10/2015  . Schizophrenia (HCC) [F20.9] 08/27/2015  . Anorexia nervosa [F50.00] 08/05/2015  . Hypokalemia [E87.6] 08/05/2015  . 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 PALPITATIONS [R00.2] 02/07/2010  . HERPES ZOSTER [B02.9] 02/08/2008  . MIGRAINE HEADACHE [G43.909] 02/08/2008  . 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 IBD [K52.89] 02/08/2008  . CONSTIPATION, CHRONIC [K59.09] 02/08/2008  . ANEMIA, IRON DEFICIENCY [D50.9] 01/25/2008  . 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 OSTEOARTHRITIS [M19.90] 01/25/2008  . POLYARTHRALGIA [M25.50] 01/25/2008  . OSTEOPOROSIS [M81.0] 01/25/2008   Total Time spent with patient: 20 minutes  Past Psychiatric History:   Past Medical History: History reviewed. No pertinent past medical history.  Past Surgical History   Procedure Laterality Date  . Breast biopsy Bilateral   . Colonoscopy  1993    crohn's disease   Family History: History reviewed. No pertinent family history. Family Psychiatric  History:  Social History:  History  Alcohol Use No     History  Drug Use No    Social History   Social History  . Marital Status: Single    Spouse Name: N/A  . Number of Children: N/A  . Years of Education: N/A   Social History Main Topics  . Smoking status: Never Smoker   . Smokeless tobacco: None  . Alcohol Use: No  . Drug Use: No  . Sexual Activity: No   Other Topics Concern  . None   Social History Narrative   Additional Social History:                         Sleep: Good  Appetite:  Poor  Current Medications: Current Facility-Administered Medications  Medication Dose Route Frequency Provider Last Rate Last Dose  . acetaminophen (TYLENOL) tablet 650 mg  650 mg Oral Q6H PRN Audery Amel, MD      . alum & mag hydroxide-simeth (MAALOX/MYLANTA) 200-200-20 MG/5ML suspension 30 mL  30 mL Oral Q4H PRN Audery Amel, MD      . docusate sodium (COLACE) capsule 100 mg  100 mg Oral BID Shari Prows, MD   100 mg at 09/12/15 0857  . feeding supplement (ENSURE ENLIVE) (ENSURE ENLIVE) liquid 237 mL  237 mL Oral TID BM Audery Amel, MD   237 mL at 09/12/15 1101  . LORazepam (ATIVAN) tablet 0.5 mg  0.5 mg Oral  BID Kerin Salen, MD      . magnesium hydroxide (MILK OF MAGNESIA) suspension 30 mL  30 mL Oral Daily PRN Audery Amel, MD      . megestrol (MEGACE) tablet 40 mg  40 mg Oral Daily Shari Prows, MD   40 mg at 09/12/15 0857  . metoprolol tartrate (LOPRESSOR) tablet 25 mg  25 mg Oral Daily Kerin Salen, MD      . mirtazapine (REMERON) tablet 45 mg  45 mg Oral QHS Audery Amel, MD   45 mg at 09/11/15 2239  . oxymetazoline (AFRIN) 0.05 % nasal spray 1 spray  1 spray Each Nare BID Kerin Salen, MD      . prenatal multivitamin tablet 1 tablet  1 tablet  Oral Q1200 Shari Prows, MD   1 tablet at 09/12/15 1216  . QUEtiapine (SEROQUEL XR) 24 hr tablet 200 mg  200 mg Oral QHS Audery Amel, MD   200 mg at 09/11/15 2239  . senna (SENOKOT) tablet 8.6 mg  1 tablet Oral Daily Shari Prows, MD   8.6 mg at 09/12/15 0857    Lab Results:  Results for orders placed or performed during the hospital encounter of 09/10/15 (from the past 48 hour(s))  Magnesium     Status: None   Collection Time: 09/12/15  9:50 AM  Result Value Ref Range   Magnesium 1.7 1.7 - 2.4 mg/dL  Phosphorus     Status: Abnormal   Collection Time: 09/12/15  9:50 AM  Result Value Ref Range   Phosphorus 2.0 (L) 2.5 - 4.6 mg/dL  Potassium     Status: None   Collection Time: 09/12/15  9:50 AM  Result Value Ref Range   Potassium 3.8 3.5 - 5.1 mmol/L    Physical Findings: AIMS:  , ,  ,  ,    CIWA:    COWS:     Musculoskeletal: Strength & Muscle Tone: Thin Gait & Station: normal Patient leans: N/A  Psychiatric Specialty Exam: Review of Systems  Constitutional: Positive for weight loss.  Psychiatric/Behavioral: Positive for depression. Negative for suicidal ideas, hallucinations, memory loss and substance abuse. The patient is nervous/anxious. The patient does not have insomnia.   All other systems reviewed and are negative.   Blood pressure 108/63, pulse 128, temperature 97.6 F (36.4 C), temperature source Oral, resp. rate 20, height 5\' 3"  (1.6 m), weight 29.937 kg (66 lb), SpO2 97 %.Body mass index is 11.69 kg/(m^2).  General Appearance: Thin and disheveled  Eye Contact::  Fair  Speech:  Slow  Volume:  Decreased  Mood:  Depressed  Affect:  Congruent  Thought Process:  Tangential  Orientation:  Full (Time, Place, and Person)  Thought Content:  Delusions paranoid  Suicidal Thoughts:  No Asians presently denying any suicidal ideation.   Homicidal Thoughts:  No  Memory:  Immediate;   Good Recent;   Good Remote;   Good  Judgement:  Poor  Insight:   Lacking  Psychomotor Activity:  Increased  Concentration:  Fair  Recall:  Poor  Fund of Knowledge:Poor  Language: Fair  Akathisia:  Negative  Handed:    AIMS (if indicated):     Assets:  Others:  limited, some family   ADL's:  Intact  Cognition: Impaired,  Severe  Sleep:  Number of Hours: 6.15   Treatment Plan Summary: Daily contact with patient to assess and evaluate symptoms and progress in treatment, Medication management and Plan  Daily  contact with patient to assess and evaluate symptoms and progress in treatment and Medication management   [name] is a 54 year old female with a history of psychotic depression and failure to thrive admitted after suicide attempt by medication overdose in the context of major loss.  1. Suicidal ideation.   2. Mood and psychosis. She was restarted on Seroquel and Remeron for depression and mood stabilization.  3. Constipation. She is on bowel regimen.  4. Failure to thrive. He weighs 66 pounds only. During her previous admission she weighed 73 pounds. Her normal weight is 90 pounds. We will start Megace and Ensure Plus.  5. Tachycardia. In the past she was given metoprolol for tachycardia. Patient was taking Lopressor 25 mg daily we'll start this today.  6. ECT consult. Will ask Dr. [name] to evaluate this patient for ECT.  7. Fall risk. PT consult to evaluate and treat.  8. Disposition. She will return home with her brother. She will follow up with [facility]. This is her fourth admission this year. She may qualify for ACT team.  9. Hypophosphatemia. Patient's phosphorous level is low at 2 Will consult medicine for management. Spoke with Dr. Elisabeth Pigeon.  10. Nasal congestion-we'll start Afrin which patient states she is used in the past.  11. Anxiety-we will start Ativan 0.5 mg twice daily which is what is on patient's medication list.  Wallace Going 09/12/2015, 3:58 PM

## 2015-09-12 NOTE — [facility] Group Notes (Signed)
[facility] LCSW Group Therapy  09/12/2015 2:32 PM  Type of Therapy:  Group Therapy  Participation Level:  Did Not Attend  Modes of Intervention:  Discussion, Education, Socialization and Support  Summary of Progress/Problems: Pt will identify unhealthy thoughts and how they impact their emotions and behavior. Pt will be encouraged to discuss these thoughts, emotions and behaviors with the group.   Srikar Chiang L Jeniece Hannis MSW, LCSWA  09/12/2015, 2:32 PM  

## 2015-09-12 NOTE — Plan of Care (Signed)
Problem: Consults Goal: [facility] Treatment Patient Education Outcome: Progressing Pt's mood is improved on this 2nd day of admission.

## 2015-09-12 NOTE — Evaluation (Signed)
Physical Therapy Evaluation Patient Details Name: [name] [name] MRN: [id number] DOB: Jul 02, 1961 Today's Date: 09/12/2015   History of Present Illness  Pt was admitted to behavioral med unit with severe major depression and suicideal idiations. Pt also with psychosis, constipation, FTT, and tachycardia. Pt with eating disorder and weighs 66#. She has had tachycardia at rest per medical record and per RN has had labs drawn to check electrolytes. PMH: schizophrenia, recurrent major depression, hypokalemia, and Crohn's disease. Pt endorses one fall in the last 12 months.  Clinical Impression  Pt demonstrates severe muscle atrophy due to very low BMI and poor PO intake. Pt is relatively unsteady on her feet and is a high fall risk. She would benefit from use of rolling walker for all ambulation however pt has low/no motivation to utilize during gait. Her narrow and single leg balance is very poor and five time sit to stand is above normative values for her age and indicative of increased fall risk. Pt would benefit from [facility] PT if she is agreeable. Currently she is considering. Pt will benefit from skilled PT services to address deficits in strength, balance, and mobility in order to return to full function at home.     Follow Up Recommendations Home health PT;Supervision - Intermittent (Assist for IADLs)    Equipment Recommendations  Rolling walker with 5" wheels (Pt with minimal motivation to utilize during gait)    Recommendations for Other Services       Precautions / Restrictions Precautions Precautions: None Restrictions Weight Bearing Restrictions: No      Mobility  Bed Mobility Overal bed mobility: Independent                Transfers Overall transfer level: Independent               General transfer comment: Able to perform sit to stand without UE support. 5TSTS: 20 seconds. Decreased LE power noted however pt is steady and stable in  standing  Ambulation/Gait Ambulation/Gait assistance: Min guard Ambulation Distance (Feet): 150 Feet Assistive device: None Gait Pattern/deviations: Step-through pattern Gait velocity: Decreased Gait velocity interpretation: Below normal speed for age/gender General Gait Details: Pt is able to ambulate without assistive device. However with horizontal and vertical head turns pt demonstrates moderate lateral gait deviations. Minimal change in speed observed with cues. Poor scanning of visual environment. Practiced with walker in room however pt refuses to use stating she doesn't like it. Walker use Associate Professor Rankin (Stroke Patients Only)       Balance Overall balance assessment: Needs assistance Sitting-balance support: No upper extremity supported Sitting balance-Leahy Scale: Good     Standing balance support: No upper extremity supported Standing balance-Leahy Scale: Fair Standing balance comment: Able to maintain wide stance without UE support. Falls when attempting to achieve narrow stance. Once in narrow stance [place]omberg with eyes closed in 3-4 seconds prior to LOB. Single leg balance <2 seconds bilateral.                             Pertinent Vitals/Pain Pain Assessment: 0-10 Pain Score: 5  Pain Location: Chronic stomach pain Pain Intervention(s): Monitored during session    Home Living Family/patient expects to be discharged to:: Private residence Living Arrangements: Other relatives;Other (Comment) ([place] she lives with her brother) Available Help at Discharge: Family (Unclear how much help  he can provide) Type of Home: House Home Access: Stairs to enter Entrance Stairs-Rails: None Entrance Stairs-Number of Steps: 1 Home Layout: One level Home Equipment: None      Prior Function Level of Independence: Needs assistance   Gait / Transfers Assistance Needed: Independent limited community  ambulator without assistive device. Does not drive  ADL's / Homemaking Assistance Needed: Independent with ADLs but requires assist for IADLs        Hand Dominance        Extremity/Trunk Assessment   Upper Extremity Assessment: Generalized weakness           Lower Extremity Assessment: Generalized weakness         Communication   Communication: No difficulties  Cognition Arousal/Alertness: Awake/alert Behavior During Therapy: Flat affect Overall Cognitive Status: Within Functional Limits for tasks assessed (AOx4)                      General Comments      Exercises        Assessment/Plan    PT Assessment Patient needs continued PT services  PT Diagnosis Difficulty walking;Generalized weakness;Abnormality of gait   PT Problem List Decreased strength;Decreased activity tolerance;Decreased balance;Decreased knowledge of use of DME;Decreased safety awareness  PT Treatment Interventions DME instruction;Gait training;Stair training;Therapeutic activities;Therapeutic exercise;Balance training;Neuromuscular re-education;Patient/family education;Cognitive remediation   PT Goals (Current goals can be found in the Care Plan section) Acute Rehab PT Goals Patient Stated Goal: Pt unable to provide a goal at this time    Frequency Min 2X/week   Barriers to discharge Decreased caregiver support Unclear how much support brother can provide at home    Co-evaluation               End of Session Equipment Utilized During Treatment: Gait belt Activity Tolerance: Patient tolerated treatment well Patient left: in bed Nurse Communication: Mobility status (Discharge recommendations)    Functional Assessment Tool Used: clinical judgement, single leg balance Functional Limitation: Mobility: Walking and moving around Mobility: Walking and Moving Around Current Status ([id number]): At least 20 percent but less than 40 percent impaired, limited or restricted Mobility:  Walking and Moving Around Goal Status [phone]): At least 20 percent but less than 40 percent impaired, limited or restricted    Time: 1004-1019 PT Time Calculation (min) (ACUTE ONLY): 15 min   Charges:   PT Evaluation $Initial PT Evaluation Tier I: 1 Procedure     PT G Codes:   PT G-Codes **NOT FOR INPATIENT CLASS** Functional Assessment Tool Used: clinical judgement, single leg balance Functional Limitation: Mobility: Walking and moving around Mobility: Walking and Moving Around Current Status (U9811): At least 20 percent but less than 40 percent impaired, limited or restricted Mobility: Walking and Moving Around Goal Status [phone]): At least 20 percent but less than 40 percent impaired, limited or restricted   Lynnea Maizes PT, DPT   Alphonse Asbridge 09/12/2015, 12:16 PM

## 2015-09-12 NOTE — Progress Notes (Signed)
Pt's is visible in milieu, calm and cooperative; denies any SI/HI/VAH;compliant with medication, pt wants to know details of her medications. Pt is requesting for snack and food, no concerns voiced. Safety maintained.

## 2015-09-12 NOTE — Progress Notes (Signed)
Patient with depressed affect, low energy, low interest. Patient encouraged to eat all meals and all snacks in dayroom with peers. Resistant but does cooperate with meal, snacks in dayroom. Cooperative with meds and plan of care. Minimal interaction with peers. Slightly confused as to who, why and how she arrived at hospital. Reorient to hospitalization and current plan of care. No SI/HI at this time. Safety maintained.

## 2015-09-13 LAB — LIPID PANEL
CHOLESTEROL: 135 mg/dL (ref 0–200)
HDL: 44 mg/dL (ref >40)
LDL CALC: 77 mg/dL (ref 0–99)
TRIGLYCERIDES: 70 mg/dL (ref <150)
Total CHOL/HDL Ratio: 3.1 RATIO
VLDL: 14 mg/dL (ref 0–40)

## 2015-09-13 LAB — HEMOGLOBIN A1C: Hgb A1c MFr Bld: 5 % (ref 4.0–6.0)

## 2015-09-13 LAB — PHOSPHORUS: Phosphorus: 2.2 mg/dL — ABNORMAL LOW (ref 2.5–4.6)

## 2015-09-13 NOTE — [facility] Group Notes (Signed)
[facility] Group Notes:  (Nursing/MHT/Case Management/Adjunct)  Date:  09/13/2015  Time:  9:47 AM  Type of Therapy:  Goal-Setting  Participation Level:  Did not attend  Isabelle Course 09/13/2015, 9:47 AM

## 2015-09-13 NOTE — Progress Notes (Signed)
[facility] - [place] at [facility]   PATIENT NAME: [name] [name]    MR#:  [id number]  DATE OF BIRTH:  03-Nov-1961  SUBJECTIVE: Medical consult follow-up . followng for hypophosphatemia. Patient has malnutrition and poor po intake, anorexia.   CHIEF COMPLAINT:  No chief complaint on file.   REVIEW OF SYSTEMS:    Review of Systems  Constitutional: Negative for fever and chills.  HENT: Negative for hearing loss.   Eyes: Negative for blurred vision, double vision and photophobia.  Respiratory: Negative for cough, hemoptysis and shortness of breath.   Cardiovascular: Negative for palpitations, orthopnea and leg swelling.  Gastrointestinal: Negative for vomiting, abdominal pain and diarrhea.  Genitourinary: Negative for dysuria and urgency.  Musculoskeletal: Negative for myalgias and neck pain.  Skin: Negative for rash.  Neurological: Negative for dizziness, focal weakness, seizures, weakness and headaches.  Psychiatric/Behavioral: Negative for memory loss. The patient does not have insomnia.     Nutrition: Tolerating Diet: Tolerating PT:      DRUG ALLERGIES:   Allergies  Allergen Reactions  . Acyclovir     Other reaction(s): Other (See Comments) Orally, triggers a migraine  . Amoxicillin-Pot Clavulanate     REACTION: GI Upset  . Cefadroxil     Other reaction(s): Unknown  . Ciprofloxacin     REACTION: GI Upset.Also avelox and floxin  . Clarithromycin     REACTION: GI Upset  . Doxycycline     Other reaction(s): Unknown  . Erythromycin   . Levofloxacin   . Sulfamethoxazole-Trimethoprim     REACTION: itching  . Albumin (Human) Nausea And Vomiting  . Penicillins Nausea Only    VITALS:  Blood pressure 106/72, pulse 92, temperature 97.9 F (36.6 C), temperature source Oral, resp. rate 20, height  (1.6 m), weight 33.566 kg (74 lb), SpO2 99 %.  PHYSICAL EXAMINATION:   Physical Exam  GENERAL:  54 y.o.-year-old patient lying in the  bed with no acute distress.  EYES: Pupils equal, round, reactive to light and accommodation. No scleral icterus. Extraocular muscles intact.  HEENT: Head atraumatic, normocephalic. Oropharynx and nasopharynx clear.  NECK:  Supple, no jugular venous distention. No thyroid enlargement, no tenderness.  LUNGS: Normal breath sounds bilaterally, no wheezing, rales,rhonchi or crepitation. No use of accessory muscles of respiration.  CARDIOVASCULAR: S1, S2 normal. No murmurs, rubs, or gallops.  ABDOMEN: Soft, nontender, nondistended. Bowel sounds present. No organomegaly or mass.  EXTREMITIES: No pedal edema, cyanosis, or clubbing.  NEUROLOGIC: Cranial nerves II through XII are intact. Muscle strength 5/5 in all extremities. Sensation intact. Gait not checked.  PSYCHIATRIC: The patient is alert and oriented x 3.  SKIN: No obvious rash, lesion, or ulcer.    LABORATORY PANEL:   CBC  Recent Labs Lab 09/09/15 2329  WBC 2.7*  HGB 14.1  HCT 40.8  PLT 191   ------------------------------------------------------------------------------------------------------------------  Chemistries   Recent Labs Lab 09/09/15 2329 09/12/15 0950  NA 136  --   K 3.2* 3.8  CL 101  --   CO2 31  --   GLUCOSE 91  --   BUN 7  --   CREATININE 0.63  --   CALCIUM 9.4  --   MG  --  1.7  AST 25  --   ALT 18  --   ALKPHOS 58  --   BILITOT 0.6  --    ------------------------------------------------------------------------------------------------------------------  Cardiac Enzymes  Recent Labs Lab 09/09/15 2329  TROPONINI <0.03   ------------------------------------------------------------------------------------------------------------------  RADIOLOGY:  No  results found.   ASSESSMENT AND PLAN:   Principal Problem:   Severe recurrent major depression with psychotic features (HCC) Active Problems:   CONSTIPATION, CHRONIC   Failure to thrive in adult  #1 hypophosphatemia, ; secondary to poor by  mouth intake; improving his supplements continue to supplement, dietary consult advised tsh  Is normal,check vitamin D levels..    All the records are reviewed and case discussed with Care Management/Social Workerr. Management plans discussed with the patient, family and they are in agreement.  CODE STATUS: full  TOTAL TIME TAKING CARE OF THIS PATIENT: 15 minutes.    Katha Hamming M.D on 09/13/2015 at 10:16 AM  Between 7am to 6pm - Pager - [phone]  After 6pm go to [email or web address] - password [email or web address] [facility]  [facility]  Office  [phone]  CC: Primary care physician; No primary care provider on file.

## 2015-09-13 NOTE — [facility] Group Notes (Signed)
[facility] LCSW Group Therapy  09/13/2015 6:34 PM  Type of Therapy:  Group Therapy  Participation Level:  Did Not Attend  Modes of Intervention:  Discussion, Education, Socialization and Support  Summary of Progress/Problems: Todays topic: Grudges  Patients will be encouraged to discuss their thoughts, feelings, and behaviors as to why one holds on to grudges and reasons why people have grudges. Patients will process the impact of grudges on their daily lives and identify thoughts and feelings related to holding grudges. Patients will identify feelings and thoughts related to what life would look like without grudges.   Fay Swider L Taran Haynesworth MSW, LCSWA  09/13/2015, 6:34 PM 

## 2015-09-13 NOTE — Progress Notes (Signed)
In hallway walking halls at onset of shift. Was medication compliant and compliant with Ensure. Encouraged to have PM snack. Was pleasant to interact with. Had an uneventful night.

## 2015-09-13 NOTE — [facility] Counselor (Signed)
Adult Comprehensive Assessment  Patient ID: [name] [name], female   DOB: 1961/07/06, 54 y.o.   MRN: [id number]  Information Source: Information source: Patient  Current Stressors:  Educational / Learning stressors: None reported  Employment / Job issues: Unemployed but receives SSDI  Family Relationships: Strain relationship with Midwife / Lack of resources (include bankruptcy): Limited income.  Housing / Lack of housing: Pt's brother moved in and she does not want him there.  Physical health (include injuries & life threatening diseases): Pt currrent weighs only 74lbs.  Social relationships: None reported  Substance abuse: None reported  Bereavement / Loss: Pt's father died in 08/07/2015, Her mother died in March 06, 2012, loss of independance   Living/Environment/Situation:  Living Arrangements: Other relatives Living conditions (as described by patient or guardian): Pt reports her brother moved in and "took over everything."  How long has patient lived in current situation?: "all my life."  What is atmosphere in current home: Supportive  Family History:  Marital status: Single Does patient have children?: No  Childhood History:  By whom was/is the patient raised?: Both parents Description of patient's relationship with caregiver when they were a child: Very close with parents  Patient's description of current relationship with people who raised him/her: Father died in 08-07-15, Mother died in 06-Mar-2012 Does patient have siblings?: Yes Number of Siblings: 1 Description of patient's current relationship with siblings: Brother who is now her guardian. She states they do not get along.  Did patient suffer any verbal/emotional/physical/sexual abuse as a child?: No Did patient suffer from severe childhood neglect?: No Has patient ever been sexually abused/assaulted/raped as an adolescent or adult?: No Was the patient ever a victim of a crime or a disaster?: No Witnessed  domestic violence?: No Has patient been effected by domestic violence as an adult?: No  Education:  Highest grade of school patient has completed: Some Automotive engineer Currently a student?: No Learning disability?: No  Employment/Work Situation:   Employment situation: On disability Why is patient on disability: Mental health  How long has patient been on disability: 2 years  Patient's job has been impacted by current illness: No What is the longest time patient has a held a job?: 20 years  Where was the patient employed at that time?: Engineer, mining  Has patient ever been in the [employer]?: No  Financial Resources:   Financial resources: Insurance claims handler Does patient have a Lawyer or guardian?: Yes Name of representative payee or guardian: [name] [name] [phone] is her guardian   Alcohol/Substance Abuse:   What has been your use of drugs/alcohol within the last 12 months?: None reported  Alcohol/Substance Abuse Treatment Hx: Denies past history Has alcohol/substance abuse ever caused legal problems?: No  Social Support System:   Patient's Community Support System: Fair Development worker, community Support System: Brother  Type of faith/religion: Christianity  How does patient's faith help to cope with current illness?: prayer   Leisure/Recreation:   Leisure and Hobbies: reading  Strengths/Needs:   What things does the patient do well?: "I'm not sure"  In what areas does patient struggle / problems for patient: Lack of independance, depression   Discharge Plan:   Does patient have access to transportation?: Yes Will patient be returning to same living situation after discharge?: Yes Currently receiving community mental health services: Yes (From Whom) ([facility]) Does patient have financial barriers related to discharge medications?: No  Summary/Recommendations:   [name] [name] is a 54 year old female who presented to [facility] after  an overdose. Her brother found her unresponsive at their home.  She reports increased depression since her father died in Sep 08, 2015. She reports living with her brother in [place]. Her brother is also her guardian [name] [name] [phone]). She receives outpatient services at [facility]. Pt plans to return home and follow up with outpatient. Recommendations include; crisis stabilization, medication management, therapeutic milieu, and encourage group attendance and participation.   [name] [name]. MSW, LCSWA  09/13/2015

## 2015-09-13 NOTE — Progress Notes (Signed)
[facility] MD Progress Note  09/13/2015 1:30 PM [name] [name]  MRN:  [id number] Subjective:   Patient was requesting a change to her Ativan dosing. She wanted it to be prescribed 1 mg at bedtime and 5 mg daily as needed. Writer explained that her outside prescription was for 0.5 twice a day. Writer did offer that she could have 1 mg at bedtime as her only dose. Patient indicated she would prefer to just keep it twice a day as is currently written. Patient indicated that she last had any kind of suicidal thoughts 3 or 4 days ago. She did indicate that there might be some paranoia. When asked about who she was concerned with she indicated was people outside of the hospital and would only say she had and "uneasy feeling" about those people.  Medicine has been consulted in regards to the patient's low phosphorus. They have ordered phosphorus replacement. Principal Problem: Severe recurrent major depression with psychotic features [facility]) Diagnosis:   Patient Active Problem List   Diagnosis Date Noted  . Failure to thrive in adult [R62.7] 09/11/2015  . 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 Severe recurrent major depression with psychotic features (HCC) [F33.3] 09/10/2015  . Schizophrenia (HCC) [F20.9] 08/27/2015  . Anorexia nervosa [F50.00] 08/05/2015  . Hypokalemia [E87.6] 08/05/2015  . 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 PALPITATIONS [R00.2] 02/07/2010  . HERPES ZOSTER [B02.9] 02/08/2008  . MIGRAINE HEADACHE [G43.909] 02/08/2008  . 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 IBD [K52.89] 02/08/2008  . CONSTIPATION, CHRONIC [K59.09] 02/08/2008  . ANEMIA, IRON DEFICIENCY [D50.9] 01/25/2008  . 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 OSTEOARTHRITIS [M19.90] 01/25/2008  . POLYARTHRALGIA [M25.50] 01/25/2008  . OSTEOPOROSIS [M81.0] 01/25/2008   Total Time spent with patient: 20 minutes  Past Psychiatric History:   Past Medical History:  Past Medical  History  Diagnosis Date  . Ulcerative colitis (HCC)   . Osteoporosis   . Malnutrition (HCC)   . Alcoholism in remission [facility])     Past Surgical History  Procedure Laterality Date  . Breast biopsy Bilateral   . Colonoscopy  1993    crohn's disease   Family History:  Family History  Problem Relation Age of Onset  . Colon cancer    . Diabetes    . Hypothyroidism     Family Psychiatric  History:  Social History:  History  Alcohol Use No     History  Drug Use No    Social History   Social History  . Marital Status: Single    Spouse Name: N/A  . Number of Children: N/A  . Years of Education: N/A   Social History Main Topics  . Smoking status: Never Smoker   . Smokeless tobacco: None  . Alcohol Use: No  . Drug Use: No  . Sexual Activity: No   Other Topics Concern  . None   Social History Narrative   Additional Social History:                         Sleep: Good  Appetite:  Poor  Current Medications: Current Facility-Administered Medications  Medication Dose Route Frequency Provider Last Rate Last Dose  . acetaminophen (TYLENOL) tablet 650 mg  650 mg Oral Q6H PRN Audery Amel, MD      . alum & mag hydroxide-simeth (MAALOX/MYLANTA) 200-200-20 MG/5ML suspension 30 mL  30 mL Oral Q4H PRN Audery Amel, MD   30 mL at 09/12/15 2203  .  docusate sodium (COLACE) capsule 100 mg  100 mg Oral BID Shari Prows, MD   100 mg at 09/13/15 0958  . feeding supplement (ENSURE ENLIVE) (ENSURE ENLIVE) liquid 237 mL  237 mL Oral TID BM Audery Amel, MD   237 mL at 09/13/15 1137  . LORazepam (ATIVAN) tablet 0.5 mg  0.5 mg Oral BID Kerin Salen, MD   0.5 mg at 09/13/15 0959  . magnesium hydroxide (MILK OF MAGNESIA) suspension 30 mL  30 mL Oral Daily PRN Audery Amel, MD      . megestrol (MEGACE) tablet 40 mg  40 mg Oral Daily Jolanta B Pucilowska, MD   40 mg at 09/13/15 0959  . metoprolol tartrate (LOPRESSOR) tablet 25 mg  25 mg Oral Daily Kerin Salen, MD   25 mg at 09/13/15 0959  . mirtazapine (REMERON) tablet 45 mg  45 mg Oral QHS Audery Amel, MD   45 mg at 09/12/15 2113  . oxymetazoline (AFRIN) 0.05 % nasal spray 1 spray  1 spray Each Nare BID Kerin Salen, MD   1 spray at 09/13/15 1000  . potassium & sodium phosphates (PHOS-NAK) 280-160-250 MG packet 1 packet  1 packet Oral TID WC & HS Milagros Loll, MD   1 packet at 09/13/15 1224  . prenatal multivitamin tablet 1 tablet  1 tablet Oral Q1200 Shari Prows, MD   1 tablet at 09/13/15 1223  . QUEtiapine (SEROQUEL XR) 24 hr tablet 200 mg  200 mg Oral QHS Audery Amel, MD   200 mg at 09/12/15 2113  . senna (SENOKOT) tablet 8.6 mg  1 tablet Oral Daily Shari Prows, MD   8.6 mg at 09/13/15 2633    Lab Results:  Results for orders placed or performed during the hospital encounter of 09/10/15 (from the past 48 hour(s))  Magnesium     Status: None   Collection Time: 09/12/15  9:50 AM  Result Value Ref Range   Magnesium 1.7 1.7 - 2.4 mg/dL  Phosphorus     Status: Abnormal   Collection Time: 09/12/15  9:50 AM  Result Value Ref Range   Phosphorus 2.0 (L) 2.5 - 4.6 mg/dL  Potassium     Status: None   Collection Time: 09/12/15  9:50 AM  Result Value Ref Range   Potassium 3.8 3.5 - 5.1 mmol/L  TSH     Status: None   Collection Time: 09/12/15  9:50 AM  Result Value Ref Range   TSH 2.276 0.350 - 4.500 uIU/mL  Phosphorus     Status: Abnormal   Collection Time: 09/13/15  6:28 AM  Result Value Ref Range   Phosphorus 2.2 (L) 2.5 - 4.6 mg/dL  Lipid panel     Status: None   Collection Time: 09/13/15  6:28 AM  Result Value Ref Range   Cholesterol 135 0 - 200 mg/dL   Triglycerides 70 <354 mg/dL   HDL 44 >56 mg/dL   Total CHOL/HDL Ratio 3.1 RATIO   VLDL 14 0 - 40 mg/dL   LDL Cholesterol 77 0 - 99 mg/dL    Comment:        Total Cholesterol/HDL:CHD Risk Coronary Heart Disease Risk Table                     Men   Women  1/2 Average Risk   3.4   3.3  Average Risk        5.0   4.4  2 X Average Risk   9.6   7.1  3 X Average Risk  23.4   11.0        Use the calculated Patient Ratio above and the CHD Risk Table to determine the patient's CHD Risk.        ATP III CLASSIFICATION (LDL):  <100     mg/dL   Optimal  161-096  mg/dL   Near or Above                    Optimal  130-159  mg/dL   Borderline  045-409  mg/dL   High  >811     mg/dL   Very High     Physical Findings: AIMS:  , ,  ,  ,    CIWA:    COWS:     Musculoskeletal: Strength & Muscle Tone: Thin Gait & Station: normal Patient leans: N/A  Psychiatric Specialty Exam: Review of Systems  Constitutional: Positive for weight loss.  Psychiatric/Behavioral: Positive for depression. Negative for suicidal ideas, hallucinations, memory loss and substance abuse. The patient is nervous/anxious. The patient does not have insomnia.   All other systems reviewed and are negative.   Blood pressure 106/72, pulse 92, temperature 97.9 F (36.6 C), temperature source Oral, resp. rate 20, height  (1.6 m), weight 33.566 kg (74 lb), SpO2 99 %.Body mass index is 13.11 kg/(m^2).  General Appearance: Thin and disheveled  Eye Contact::  Fair  Speech:  Slow  Volume:  Decreased  Mood:  Depressed  Affect:  Congruent  Thought Process:  Tangential  Orientation:  Full (Time, Place, and Person)  Thought Content:  Delusions paranoid  Suicidal Thoughts:  No Asians presently denying any suicidal ideation.   Homicidal Thoughts:  No  Memory:  Immediate;   Good Recent;   Good Remote;   Good  Judgement:  Poor  Insight:  Lacking  Psychomotor Activity:  Increased  Concentration:  Fair  Recall:  Poor  Fund of Knowledge:Poor  Language: Fair  Akathisia:  Negative  Handed:    AIMS (if indicated):     Assets:  Others:  limited, some family   ADL's:  Intact  Cognition: Impaired,  Severe  Sleep:  Number of Hours: 7.25   Treatment Plan Summary: Daily contact with patient to assess and evaluate symptoms and  progress in treatment, Medication management and Plan  Daily contact with patient to assess and evaluate symptoms and progress in treatment and Medication management   Ms. Sifuentes is a 54 year old female with a history of psychotic depression and failure to thrive admitted after suicide attempt by medication overdose in the context of major loss.  1. Suicidal ideation.   2. Mood and psychosis. She was restarted on Seroquel and Remeron for depression and mood stabilization.  3. Constipation. She is on bowel regimen.  4. Failure to thrive. He weighs 66 pounds only. During her previous admission she weighed 73 pounds. Her normal weight is 90 pounds. We will start Megace and Ensure Plus.  5. Tachycardia. In the past she was given metoprolol for tachycardia. Patient was taking Lopressor 25 mg daily we'll start this today.  6. ECT consult. Will ask Dr. [name] to evaluate this patient for ECT.  7. Fall risk. PT consult to evaluate and treat.  8. Disposition. She will return home with her brother. She will follow up with [facility]. This is her fourth admission this year. She may qualify for ACT team.  9. Hypophosphatemia. Patient's phosphorous  level is low at 2. Medicine consult has ordered phosphorus replacement for 6 doses and it appears her phosphorus today as increased from 2 to 2.2.  10. Nasal congestion-we'll start Afrin which patient states she is used in the past.  11. Anxiety-we will start Ativan 0.5 mg twice daily which is what is on patient's medication list.  Wallace Going 09/13/2015, 1:30 PM

## 2015-09-14 LAB — COMPREHENSIVE METABOLIC PANEL
ALBUMIN: 3.8 g/dL (ref 3.5–5.0)
ALK PHOS: 52 U/L (ref 38–126)
ALT: 16 U/L (ref 14–54)
AST: 20 U/L (ref 15–41)
Anion gap: 11 (ref 5–15)
BILIRUBIN TOTAL: 0.2 mg/dL — AB (ref 0.3–1.2)
BUN: 13 mg/dL (ref 6–20)
CALCIUM: 9.6 mg/dL (ref 8.9–10.3)
CO2: 22 mmol/L (ref 22–32)
CREATININE: 0.4 mg/dL — AB (ref 0.44–1.00)
Chloride: 107 mmol/L (ref 101–111)
GFR calc Af Amer: >60 mL/min (ref >60)
GLUCOSE: 89 mg/dL (ref 65–99)
Potassium: 3.7 mmol/L (ref 3.5–5.1)
Sodium: 140 mmol/L (ref 135–145)
TOTAL PROTEIN: 6.6 g/dL (ref 6.5–8.1)

## 2015-09-14 LAB — PHOSPHORUS: Phosphorus: 3 mg/dL (ref 2.5–4.6)

## 2015-09-14 LAB — MAGNESIUM: MAGNESIUM: 1.7 mg/dL (ref 1.7–2.4)

## 2015-09-14 MED ORDER — MIRTAZAPINE 30 MG PO TABS
30.0000 mg | ORAL_TABLET | Freq: Every day | ORAL | Status: DC
  Administered 2015-09-14 – 2015-09-20 (×7): 30 mg via ORAL
  Filled 2015-09-14 (×7): qty 1

## 2015-09-14 MED ORDER — LORAZEPAM 0.5 MG PO TABS
0.5000 mg | ORAL_TABLET | Freq: Two times a day (BID) | ORAL | Status: DC | PRN
  Administered 2015-09-14 – 2015-09-20 (×9): 0.5 mg via ORAL
  Filled 2015-09-14 (×9): qty 1

## 2015-09-14 MED ORDER — PANTOPRAZOLE SODIUM 40 MG PO TBEC
40.0000 mg | DELAYED_RELEASE_TABLET | Freq: Every day | ORAL | Status: DC
Start: 2015-09-14 — End: 2015-09-21
  Administered 2015-09-14 – 2015-09-21 (×8): 40 mg via ORAL
  Filled 2015-09-14 (×8): qty 1

## 2015-09-14 MED ORDER — LORAZEPAM 1 MG PO TABS
1.0000 mg | ORAL_TABLET | Freq: Every day | ORAL | Status: DC
  Administered 2015-09-14 – 2015-09-20 (×7): 1 mg via ORAL
  Filled 2015-09-14 (×7): qty 1

## 2015-09-14 MED ORDER — ONDANSETRON HCL 4 MG PO TABS
4.0000 mg | ORAL_TABLET | Freq: Three times a day (TID) | ORAL | Status: DC | PRN
  Administered 2015-09-19 – 2015-09-21 (×3): 4 mg via ORAL
  Filled 2015-09-14 (×3): qty 1

## 2015-09-14 NOTE — Progress Notes (Signed)
[facility] MD Progress Note  09/14/2015 2:34 PM [name] [name]  MRN:  [id number]  Subjective:  [name] [name] does not feel well physically today. She complains of nausea. She did not have lunch today. She was unable to sleep well last night. She again is asking additional lorazepam at bedtime." She also uses lorazepam as needed for anxiety. She reports heightened anxiety for the past 2 days with some tremors. I wonder if high-dose of Remeron is a contributing factor. She is in her room without a sitter. She is very pleasant and conversational. She reports worsening of depression most likely due to physical complaints. She is not suicidal or homicidal. She is still paranoid. She reports that auditory hallucinations are improving. She does not participate in groups.  Principal Problem: Severe recurrent major depression with psychotic features [facility]) Diagnosis:   Patient Active Problem List   Diagnosis Date Noted  . Failure to thrive in adult [R62.7] 09/11/2015  . Sedative intoxication ([place]) [H54.562] 09/10/2015  . Severe recurrent major depression with psychotic features ([place]) [F33.3] 09/10/2015  . Schizophrenia ([place]) [F20.9] 08/27/2015  . Anorexia nervosa [F50.00] 08/05/2015  . Hypokalemia [E87.6] 08/05/2015  . 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 PALPITATIONS [R00.2] 02/07/2010  . HERPES ZOSTER [B02.9] 02/08/2008  . MIGRAINE HEADACHE [G43.909] 02/08/2008  . 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 IBD [K52.89] 02/08/2008  . CONSTIPATION, CHRONIC [K59.09] 02/08/2008  . ANEMIA, IRON DEFICIENCY [D50.9] 01/25/2008  . 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 OSTEOARTHRITIS [M19.90] 01/25/2008  . POLYARTHRALGIA [M25.50] 01/25/2008  . OSTEOPOROSIS [M81.0] 01/25/2008   Total Time spent with patient: 20 minutes  Past Psychiatric History: Severe depression.  Past Medical History:  Past Medical History  Diagnosis Date  . Ulcerative colitis ([place])   . Osteoporosis    . Malnutrition ([place])   . Alcoholism in remission [facility])     Past Surgical History  Procedure Laterality Date  . Breast biopsy Bilateral   . Colonoscopy  1993    crohn's disease   Family History:  Family History  Problem Relation Age of Onset  . Colon cancer    . Diabetes    . Hypothyroidism     Family Psychiatric  History: None reported. Social History:  History  Alcohol Use No     History  Drug Use No    Social History   Social History  . Marital Status: Single    Spouse Name: N/A  . Number of Children: N/A  . Years of Education: N/A   Social History Main Topics  . Smoking status: Never Smoker   . Smokeless tobacco: None  . Alcohol Use: No  . Drug Use: No  . Sexual Activity: No   Other Topics Concern  . None   Social History Narrative   Additional Social History:                         Sleep: Fair  Appetite:  Poor  Current Medications: Current Facility-Administered Medications  Medication Dose Route Frequency Provider Last Rate Last Dose  . acetaminophen (TYLENOL) tablet 650 mg  650 mg Oral Q6H PRN Gonzella Lex, MD      . alum & mag hydroxide-simeth (MAALOX/MYLANTA) 200-200-20 MG/5ML suspension 30 mL  30 mL Oral Q4H PRN Gonzella Lex, MD   30 mL at 09/14/15 1126  . docusate sodium (COLACE) capsule 100 mg  100 mg Oral BID Clovis Fredrickson, MD   100 mg at 09/14/15  0920  . feeding supplement (ENSURE ENLIVE) (ENSURE ENLIVE) liquid 237 mL  237 mL Oral TID BM Audery Amel, MD   237 mL at 09/14/15 1025  . LORazepam (ATIVAN) tablet 0.5 mg  0.5 mg Oral BID PRN Jolanta B Pucilowska, MD      . LORazepam (ATIVAN) tablet 1 mg  1 mg Oral QHS Jolanta B Pucilowska, MD      . magnesium hydroxide (MILK OF MAGNESIA) suspension 30 mL  30 mL Oral Daily PRN Audery Amel, MD      . megestrol (MEGACE) tablet 40 mg  40 mg Oral Daily Jolanta B Pucilowska, MD   40 mg at 09/14/15 0920  . metoprolol tartrate (LOPRESSOR) tablet 25 mg  25 mg Oral Daily Kerin Salen, MD   25 mg at 09/14/15 0920  . mirtazapine (REMERON) tablet 30 mg  30 mg Oral QHS Jolanta B Pucilowska, MD      . ondansetron (ZOFRAN) tablet 4 mg  4 mg Oral Q8H PRN Jolanta B Pucilowska, MD      . oxymetazoline (AFRIN) 0.05 % nasal spray 1 spray  1 spray Each Nare BID Kerin Salen, MD   1 spray at 09/14/15 0920  . pantoprazole (PROTONIX) EC tablet 40 mg  40 mg Oral Daily Jolanta B Pucilowska, MD      . prenatal multivitamin tablet 1 tablet  1 tablet Oral Q1200 Shari Prows, MD   1 tablet at 09/14/15 1229  . QUEtiapine (SEROQUEL XR) 24 hr tablet 200 mg  200 mg Oral QHS Audery Amel, MD   200 mg at 09/13/15 2109  . senna (SENOKOT) tablet 8.6 mg  1 tablet Oral Daily Shari Prows, MD   8.6 mg at 09/14/15 0920    Lab Results:  Results for orders placed or performed during the hospital encounter of 09/10/15 (from the past 48 hour(s))  Phosphorus     Status: Abnormal   Collection Time: 09/13/15  6:28 AM  Result Value Ref Range   Phosphorus 2.2 (L) 2.5 - 4.6 mg/dL  Lipid panel     Status: None   Collection Time: 09/13/15  6:28 AM  Result Value Ref Range   Cholesterol 135 0 - 200 mg/dL   Triglycerides 70 <487 mg/dL   HDL 44 >93 mg/dL   Total CHOL/HDL Ratio 3.1 RATIO   VLDL 14 0 - 40 mg/dL   LDL Cholesterol 77 0 - 99 mg/dL    Comment:        Total Cholesterol/HDL:CHD Risk Coronary Heart Disease Risk Table                     Men   Women  1/2 Average Risk   3.4   3.3  Average Risk       5.0   4.4  2 X Average Risk   9.6   7.1  3 X Average Risk  23.4   11.0        Use the calculated Patient Ratio above and the CHD Risk Table to determine the patient's CHD Risk.        ATP III CLASSIFICATION (LDL):  <100     mg/dL   Optimal  962-824  mg/dL   Near or Above                    Optimal  130-159  mg/dL   Borderline  942-919  mg/dL   High  >615  mg/dL   Very High   Hemoglobin A1c     Status: None   Collection Time: 09/13/15  6:28 AM  Result Value Ref  Range   Hgb A1c MFr Bld 5.0 4.0 - 6.0 %  Comprehensive metabolic panel     Status: Abnormal   Collection Time: 09/14/15 11:15 AM  Result Value Ref Range   Sodium 140 135 - 145 mmol/L   Potassium 3.7 3.5 - 5.1 mmol/L   Chloride 107 101 - 111 mmol/L   CO2 22 22 - 32 mmol/L   Glucose, Bld 89 65 - 99 mg/dL   BUN 13 6 - 20 mg/dL   Creatinine, Ser 0.40 (L) 0.44 - 1.00 mg/dL   Calcium 9.6 8.9 - 10.3 mg/dL   Total Protein 6.6 6.5 - 8.1 g/dL   Albumin 3.8 3.5 - 5.0 g/dL   AST 20 15 - 41 U/L   ALT 16 14 - 54 U/L   Alkaline Phosphatase 52 38 - 126 U/L   Total Bilirubin 0.2 (L) 0.3 - 1.2 mg/dL   GFR calc non Af Amer >60 >60 mL/min   GFR calc Af Amer >60 >60 mL/min    Comment: (NOTE) The eGFR has been calculated using the CKD EPI equation. This calculation has not been validated in all clinical situations. eGFR's persistently <60 mL/min signify possible Chronic Kidney Disease.    Anion gap 11 5 - 15  Magnesium     Status: None   Collection Time: 09/14/15 11:15 AM  Result Value Ref Range   Magnesium 1.7 1.7 - 2.4 mg/dL  Phosphorus     Status: None   Collection Time: 09/14/15 11:15 AM  Result Value Ref Range   Phosphorus 3.0 2.5 - 4.6 mg/dL    Physical Findings: AIMS:  , ,  ,  ,    CIWA:    COWS:     Musculoskeletal: Strength & Muscle Tone: within normal limits Gait & Station: normal Patient leans: N/A  Psychiatric Specialty Exam: Review of Systems  Gastrointestinal: Positive for nausea.  All other systems reviewed and are negative.   Blood pressure 113/74, pulse 99, temperature 97.9 F (36.6 C), temperature source Oral, resp. rate 20, height $RemoveBe'5\' 3"'LQOVcJtBI$  (1.6 m), weight 34.02 kg (75 lb), SpO2 99 %.Body mass index is 13.29 kg/(m^2).  General Appearance: Casual  Eye Contact::  Fair  Speech:  Clear and Coherent  Volume:  Decreased  Mood:  Anxious and Depressed  Affect:  Constricted  Thought Process:  Goal Directed  Orientation:  Full (Time, Place, and Person)  Thought Content:   WDL, Delusions and Paranoid Ideation  Suicidal Thoughts:  No  Homicidal Thoughts:  No  Memory:  Immediate;   Fair Recent;   Fair Remote;   Fair  Judgement:  Impaired  Insight:  Shallow  Psychomotor Activity:  Decreased  Concentration:  Fair  Recall:  [place]: Fair  Akathisia:  No  Handed:  Right  AIMS (if indicated):     Assets:  Communication Skills Desire for Improvement Financial Resources/Insurance Housing Resilience Social Support  ADL's:  Intact  Cognition: WNL  Sleep:  Number of Hours: 6.25   Treatment Plan Summary: Daily contact with patient to assess and evaluate symptoms and progress in treatment and Medication management   Ms. Samara is a 53 year old female with a history of psychotic depression and failure to thrive admitted after suicide attempt by medication overdose in the context of major loss.  1. Suicidal ideation.  She still has passive suicidal thoughts but is able to contract for safety in the hospital  2. Mood and psychosis. She was restarted on Seroquel and Remeron for depression and mood stabilization. We will lower Remeron 30 mg at bedtime to address restlessness.   3. Constipation. She is on bowel regimen.  4. Failure to thrive. He weighs 66 pounds only. During her previous admission she weighed 73 pounds. Her normal weight is 90 pounds. We started Megace and Ensure Plus. Dietary consult pending.  5. Tachycardia. We restarted Lopressor 25 mg daily  for tachycardia.   6. ECT consult. Will ask Dr. Weber Cooks to evaluate this patient for ECT. The patient is afraid of anesthesia and does not want to consider ECT.  7. Fall risk. PT consult completed. Patient refuses to use a walker.   8. Hypophosphatemia. Patient's phosphorous level is low at 2. Medicine consult has ordered phosphorus replacement for 6 doses and it appears her phosphorus today as increased from 2 to 2.2.  9. Nasal congestion-we'll start Afrin which  patient states she is used in the past.  10. Nausea. She will have Zofran available as needed. Will start Protonix as well.  11. Anxiety. We restarted Ativan 0.5 mg twice daily as needed for anxiety with additional dose at bedtime.   12. Disposition. She will return home with her brother. She will follow up with [facility]. This is her fourth admission this year. She may qualify for ACT team.     Jolanta Pucilowska 09/14/2015, 2:34 PM

## 2015-09-14 NOTE — Progress Notes (Signed)
Nutrition Follow-up  INTERVENTION:   Meals and Snacks: Cater to patient preferences Medical Food Supplement Therapy: continue Ensure Enlive po TID, each supplement provides 350 kcal and 20 grams of protein Coordination of Care: continue to recommend referral to RD and/or treatment team that specializes in eating disorders for further counseling   NUTRITION DIAGNOSIS:   Inadequate oral intake related to  (eating disorder and severe depression) as evidenced by  (BMI of 11).  GOAL:   Patient will meet greater than or equal to 90% of their needs  MONITOR:    (Energy intake, Anthropometric)  REASON FOR ASSESSMENT:   Consult Poor PO  ASSESSMENT:    Pt is still paranoid, reports auditory hallucinations are improving  Diet Order:  Diet regular Room service appropriate?: Yes; Fluid consistency:: Thin  Energy Intake: noted pt drinking Ensure, recorded po intake 69% of meals on average  Electrolyte and Renal Profile:  Recent Labs Lab 09/09/15 2329 09/12/15 0950 09/13/15 0628 09/14/15 1115  BUN 7  --   --  13  CREATININE 0.63  --   --  0.40*  NA 136  --   --  140  K 3.2* 3.8  --  3.7  MG  --  1.7  --  1.7  PHOS  --  2.0* 2.2* 3.0   Glucose Profile: No results for input(s): GLUCAP in the last 72 hours.  Height:   Ht Readings from Last 1 Encounters:  09/10/15 5\' 3"  (1.6 m)    Weight: noted weight trending up  Wt Readings from Last 1 Encounters:  09/14/15 75 lb (34.02 kg)    Filed Weights   09/10/15 2330 09/13/15 0706 09/14/15 0656  Weight: 66 lb (29.937 kg) 74 lb (33.566 kg) 75 lb (34.02 kg)    BMI:  Body mass index is 13.29 kg/(m^2).  Estimated Nutritional Needs:   Kcal:  1200-1400 kcals/d   Protein:  (0.8-1.0 g/kg) 42-52 g/d (Using IBW of 52kg)  EDUCATION NEEDS:   No education needs identified at this time   LOW Care Level  Romelle Starcher MS, RD, LDN [phone] Pager

## 2015-09-14 NOTE — Progress Notes (Signed)
Recreation Therapy Notes  Date: 10.31.16 Time: 3:00 pm Location: Craft Room  Group Topic: Wellness  Goal Area(s) Addresses:  Patient will identify at least one item per dimension of health. Patient will examine areas they are deficient in.  Behavioral Response: Did not attend  Intervention: 6 Dimensions of Health  Activity: Patients were given a worksheet with the definitions of the 6 dimensions of health. Patients were given a second worksheet with the 6 dimensions of health on it and instructed to write out at least one item they are currently doing in each dimension.  Education: LRT educated patients on way they can improve each dimension.  Education Outcome: Patient did not attend group.  Clinical Observations/Feedback: Patient did not attend group.  Jacquelynn Cree, LRT/CTRS 09/14/2015 3:57 PM

## 2015-09-14 NOTE — Progress Notes (Signed)
She is with depressed affect & hypoactive.With encouragement she came to day room for all meals.No interaction with peers.So anxious this evening states "the police is coming to arrest me."Reassured her and she agreed that she did not do anything wrong.Compliant with meds.Did not attend groups.

## 2015-09-14 NOTE — Progress Notes (Signed)
[facility]   PATIENT NAME: [name] [name]    MR#:  [id number]  DATE OF BIRTH:  05-05-1961  SUBJECTIVE: Medical consult follow-up . followng for hypophosphatemia. Patient has malnutrition and poor po intake, anorexia. Admitted for Suicidal ideation,  CHIEF COMPLAINT:  No chief complaint on file.   REVIEW OF SYSTEMS:    Review of Systems  Constitutional: Negative for fever and chills.  HENT: Negative for hearing loss.   Eyes: Negative for blurred vision, double vision and photophobia.  Respiratory: Negative for cough, hemoptysis and shortness of breath.   Cardiovascular: Negative for palpitations, orthopnea and leg swelling.  Gastrointestinal: Negative for vomiting, abdominal pain and diarrhea.  Genitourinary: Negative for dysuria and urgency.  Musculoskeletal: Negative for myalgias and neck pain.  Skin: Negative for rash.  Neurological: Negative for dizziness, focal weakness, seizures, weakness and headaches.  Psychiatric/Behavioral: Negative for memory loss. The patient does not have insomnia.     Nutrition: Tolerating Diet: Tolerating PT:      DRUG ALLERGIES:   Allergies  Allergen Reactions  . Acyclovir     Other reaction(s): Other (See Comments) Orally, triggers a migraine  . Amoxicillin-Pot Clavulanate     REACTION: GI Upset  . Cefadroxil     Other reaction(s): Unknown  . Ciprofloxacin     REACTION: GI Upset.Also avelox and floxin  . Clarithromycin     REACTION: GI Upset  . Doxycycline     Other reaction(s): Unknown  . Erythromycin   . Levofloxacin   . Sulfamethoxazole-Trimethoprim     REACTION: itching  . Albumin (Human) Nausea And Vomiting  . Penicillins Nausea Only    VITALS:  Blood pressure 113/74, pulse 99, temperature 97.9 F (36.6 C), temperature source Oral, resp. rate 20, height 5\' 3"  (1.6 m), weight 34.02 kg (75 lb), SpO2 99 %.  PHYSICAL EXAMINATION:   Physical Exam  GENERAL:  54  y.o.-year-old patient lying in the bed with no acute distress.  EYES: Pupils equal, round, reactive to light and accommodation. No scleral icterus. Extraocular muscles intact.  HEENT: Head atraumatic, normocephalic. Oropharynx and nasopharynx clear.  NECK:  Supple, no jugular venous distention. No thyroid enlargement, no tenderness.  LUNGS: Normal breath sounds bilaterally, no wheezing, rales,rhonchi or crepitation. No use of accessory muscles of respiration.  CARDIOVASCULAR: S1, S2 normal. No murmurs, rubs, or gallops.  ABDOMEN: Soft, nontender, nondistended. Bowel sounds present. No organomegaly or mass.  EXTREMITIES: No pedal edema, cyanosis, or clubbing.  NEUROLOGIC: Cranial nerves II through XII are intact. Muscle strength 5/5 in all extremities. Sensation intact. Gait not checked.  PSYCHIATRIC: The patient is alert and oriented x 3.  SKIN: No obvious rash, lesion, or ulcer.    LABORATORY PANEL:   CBC  Recent Labs Lab 09/09/15 2329  WBC 2.7*  HGB 14.1  HCT 40.8  PLT 191   ------------------------------------------------------------------------------------------------------------------  Chemistries   Recent Labs Lab 09/09/15 2329 09/12/15 0950  NA 136  --   K 3.2* 3.8  CL 101  --   CO2 31  --   GLUCOSE 91  --   BUN 7  --   CREATININE 0.63  --   CALCIUM 9.4  --   MG  --  1.7  AST 25  --   ALT 18  --   ALKPHOS 58  --   BILITOT 0.6  --    ------------------------------------------------------------------------------------------------------------------  Cardiac Enzymes  Recent Labs Lab 09/09/15 2329  TROPONINI <0.03   ------------------------------------------------------------------------------------------------------------------  RADIOLOGY:  No results found.   ASSESSMENT AND PLAN:   Principal Problem:   Severe recurrent major depression with psychotic features (HCC) Active Problems:   CONSTIPATION, CHRONIC   Failure to thrive in adult  #1  hypophosphatemia, ; secondary to poor by mouth intake; improving his supplements continue to supplement, dietary consult advised Pharmacy   To replace electrolytes., will sign off.  2. History of ulcerative colitis: Patient has a G-tube, gets the bolus feeding from G-tube. Restart them. #3. Malnutrition secondary to GI issues right now she is gaining weight, . Continue nutritional supplements,  All the records are reviewed and case discussed with Care Management/Social Workerr. Management plans discussed with the patient, family and they are in agreement.  CODE STATUS: full  TOTAL TIME TAKING CARE OF THIS PATIENT: 15 minutes.    Katha Hamming M.D on 09/14/2015 at 12:03 PM  Between 7am to 6pm - Pager - [phone]  After 6pm go to [email or web address] - password [email or web address] [facility]  [place] [facility]  Office  [phone]  CC: Primary care physician; No primary care provider on file.

## 2015-09-14 NOTE — Progress Notes (Signed)
Pleasant to interact with and medication compliant. Denied A,V,H and SI, HI. Consumed ensure with no issues. Encouraged to also have PM snack.  Had an uneventful night.

## 2015-09-14 NOTE — [facility] Group Notes (Signed)
[facility] LCSW Group Therapy  09/14/2015 3:35 PM  Type of Therapy:  Group Therapy  Participation Level:  Did Not Attend   Lulu Riding, MSW, LCSWA 09/14/2015, 3:35 PM

## 2015-09-14 NOTE — Plan of Care (Signed)
Problem: [facility] Participation in Recreation Therapeutic Interventions Goal: STG-Patient will demonstrate improved self esteem by identif STG: Self-Esteem - Within 4 treatment sessions, patient will verbalize at least 5 positive affirmation statements in each of 2 treatment sessions to increase self-esteem post d/c.  Outcome: Progressing Treatment Session 1; Completed 1 out of 2: At approximately 1:30 pm, LRT met with patient in patient room. Patient verbalized 5 positive affirmation statements. Patient reported it felt "so-so". LRT encouraged patient to continue saying positive affirmation statements.  Leonette Monarch, LRT/CTRS 10.31.16 4:31 pm Goal: STG-Other Recreation Therapy Goal (Specify) STG: Stress Management - Within 4 treatment sessions, patient will verbalize understanding of the stress management techniques in each of 2 treatment sessions to increase stress management skills post d/c.  Outcome: Progressing Treatment Session 1; Completed 1 out of 2: At approximately 1:30 pm, LRT met with patient in patient room. LRT educated and provided patient with handouts on stress management techniques. Patient verbalized understanding. LRT encouraged patient to read over and practice the stress management techniques.  Leonette Monarch, LRT/CTRS 10.31.16 4:35 pm

## 2015-09-14 NOTE — Progress Notes (Signed)
MEDICATION RELATED CONSULT NOTE - INITIAL   Pharmacy Consult for electrolyte replacement Indication: Low Phos  Allergies  Allergen Reactions  . Acyclovir     Other reaction(s): Other (See Comments) Orally, triggers a migraine  . Amoxicillin-Pot Clavulanate     REACTION: GI Upset  . Cefadroxil     Other reaction(s): Unknown  . Ciprofloxacin     REACTION: GI Upset.Also avelox and floxin  . Clarithromycin     REACTION: GI Upset  . Doxycycline     Other reaction(s): Unknown  . Erythromycin   . Levofloxacin   . Sulfamethoxazole-Trimethoprim     REACTION: itching  . Albumin (Human) Nausea And Vomiting  . Penicillins Nausea Only    Patient Measurements: Height: 5\' 3"  (160 cm) Weight: 75 lb (34.02 kg) IBW/kg (Calculated) : 52.4   Vital Signs: BP: 113/74 mmHg (10/31 0651) Pulse Rate: 99 (10/31 0651) Intake/Output from previous day: 10/30 0701 - 10/31 0700 In: 1217 [P.O.:980] Out: -  Intake/Output from this shift:    Labs:  Recent Labs  09/12/15 0950 09/13/15 0628 09/14/15 1115  CREATININE  --   --  0.40*  MG 1.7  --  1.7  PHOS 2.0* 2.2* 3.0  ALBUMIN  --   --  3.8  PROT  --   --  6.6  AST  --   --  20  ALT  --   --  16  ALKPHOS  --   --  52  BILITOT  --   --  0.2*   Estimated Creatinine Clearance: 43.1 mL/min (by C-G formula based on Cr of 0.4).   Microbiology: No results found for this or any previous visit (from the past 720 hour(s)).  Medical History: Past Medical History  Diagnosis Date  . Ulcerative colitis (HCC)   . Osteoporosis   . Malnutrition (HCC)   . Alcoholism in remission [facility])      Assessment: 54 yo female currently s/p 5 doses of Phos-NaK packets. K 3.7, Phos 3.0, Mag 1.7  Plan:  Electrolytes are WNL today - no supplementation needed at this time.  Will recheck Mag, Phos, and K in AM. If labs are WNL may consider signing off.   Pharmacy will continue to follow.   [name] L 09/14/2015,4:33 PM

## 2015-09-15 LAB — COMPREHENSIVE METABOLIC PANEL
ALT: 20 U/L (ref 14–54)
AST: 23 U/L (ref 15–41)
Albumin: 3.9 g/dL (ref 3.5–5.0)
Alkaline Phosphatase: 52 U/L (ref 38–126)
Anion gap: 7 (ref 5–15)
BILIRUBIN TOTAL: 0.4 mg/dL (ref 0.3–1.2)
BUN: 15 mg/dL (ref 6–20)
CO2: 26 mmol/L (ref 22–32)
CREATININE: 0.5 mg/dL (ref 0.44–1.00)
Calcium: 9.4 mg/dL (ref 8.9–10.3)
Chloride: 105 mmol/L (ref 101–111)
Glucose, Bld: 92 mg/dL (ref 65–99)
POTASSIUM: 3.9 mmol/L (ref 3.5–5.1)
Sodium: 138 mmol/L (ref 135–145)
TOTAL PROTEIN: 6.8 g/dL (ref 6.5–8.1)

## 2015-09-15 LAB — URINALYSIS COMPLETE WITH MICROSCOPIC (ARMC ONLY)
BILIRUBIN URINE: NEGATIVE
Bacteria, UA: NONE SEEN
GLUCOSE, UA: NEGATIVE mg/dL
HGB URINE DIPSTICK: NEGATIVE
Ketones, ur: NEGATIVE mg/dL
NITRITE: NEGATIVE
Protein, ur: NEGATIVE mg/dL
SPECIFIC GRAVITY, URINE: 1.005 (ref 1.005–1.030)
Squamous Epithelial / LPF: NONE SEEN
pH: 6 (ref 5.0–8.0)

## 2015-09-15 LAB — CBC
HEMATOCRIT: 40.2 % (ref 35.0–47.0)
Hemoglobin: 13.4 g/dL (ref 12.0–16.0)
MCH: 31.8 pg (ref 26.0–34.0)
MCHC: 33.3 g/dL (ref 32.0–36.0)
MCV: 95.7 fL (ref 80.0–100.0)
PLATELETS: 229 10*3/uL (ref 150–440)
RBC: 4.2 MIL/uL (ref 3.80–5.20)
RDW: 13.6 % (ref 11.5–14.5)
WBC: 3.1 10*3/uL — AB (ref 3.6–11.0)

## 2015-09-15 LAB — LIPASE, BLOOD: Lipase: 32 U/L (ref 11–51)

## 2015-09-15 LAB — PHOSPHORUS: Phosphorus: 3.4 mg/dL (ref 2.5–4.6)

## 2015-09-15 LAB — MAGNESIUM: MAGNESIUM: 2.1 mg/dL (ref 1.7–2.4)

## 2015-09-15 LAB — POTASSIUM: POTASSIUM: 3.9 mmol/L (ref 3.5–5.1)

## 2015-09-15 NOTE — Progress Notes (Signed)
[facility] MD Progress Note  09/15/2015 11:55 AM [name] [name]  MRN:  [id number]  Subjective:  [name] is much worse today. Yesterday she was cooperative and well organized physically fit to get dressed and walk around the unit. This morning she is weak, unsteady on her feet, complaining of abdominal pain, anxious and unable to speak clearly. She seems confused and unable to answer simple questions. She has sitter at bedside. She denies thoughts of hurting herself or others. She is in the room. She does not interact with peers or staff. She accepts medications as prescribed. She denies any side effects. Her electrolytes stabilized after phosphorus replacement. There is a history of hypomania. The patient denies excessive drinking back in the past few days she did not have sitter to observe her. Will order labs to rule out hyponatremia. She slept better with the addition of Ativan. She did not eat much today or yesterday due to nausea.   Principal Problem: Severe recurrent major depression with psychotic features [facility]) Diagnosis:   Patient Active Problem List   Diagnosis Date Noted  . Failure to thrive in adult [R62.7] 09/11/2015  . Sedative intoxication ([place]) [W96.759] 09/10/2015  . Severe recurrent major depression with psychotic features ([place]) [F33.3] 09/10/2015  . Schizophrenia ([place]) [F20.9] 08/27/2015  . Anorexia nervosa [F50.00] 08/05/2015  . Hypokalemia [E87.6] 08/05/2015  . 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 PALPITATIONS [R00.2] 02/07/2010  . HERPES ZOSTER [B02.9] 02/08/2008  . MIGRAINE HEADACHE [G43.909] 02/08/2008  . 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 IBD [K52.89] 02/08/2008  . CONSTIPATION, CHRONIC [K59.09] 02/08/2008  . ANEMIA, IRON DEFICIENCY [D50.9] 01/25/2008  . 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 OSTEOARTHRITIS [M19.90] 01/25/2008  . POLYARTHRALGIA [M25.50] 01/25/2008  . OSTEOPOROSIS [M81.0] 01/25/2008   Total Time spent with  patient: 20 minutes  Past Psychiatric History: Severe depression.  Past Medical History:  Past Medical History  Diagnosis Date  . Ulcerative colitis ([place])   . Osteoporosis   . Malnutrition ([place])   . Alcoholism in remission [facility])     Past Surgical History  Procedure Laterality Date  . Breast biopsy Bilateral   . Colonoscopy  1993    crohn's disease   Family History:  Family History  Problem Relation Age of Onset  . Colon cancer    . Diabetes    . Hypothyroidism     Family Psychiatric  History: None reported. Social History:  History  Alcohol Use No     History  Drug Use No    Social History   Social History  . Marital Status: Single    Spouse Name: N/A  . Number of Children: N/A  . Years of Education: N/A   Social History Main Topics  . Smoking status: Never Smoker   . Smokeless tobacco: None  . Alcohol Use: No  . Drug Use: No  . Sexual Activity: No   Other Topics Concern  . None   Social History Narrative   Additional Social History:                         Sleep: Fair  Appetite:  Poor  Current Medications: Current Facility-Administered Medications  Medication Dose Route Frequency Provider Last Rate Last Dose  . acetaminophen (TYLENOL) tablet 650 mg  650 mg Oral Q6H PRN Gonzella Lex, MD      . alum & mag hydroxide-simeth (MAALOX/MYLANTA) 200-200-20 MG/5ML suspension 30 mL  30 mL Oral Q4H PRN Jenny Reichmann  T Clapacs, MD   30 mL at 09/14/15 1126  . docusate sodium (COLACE) capsule 100 mg  100 mg Oral BID [place] Pizzimenti B Takira Sherrin, MD   100 mg at 09/15/15 1028  . feeding supplement (ENSURE ENLIVE) (ENSURE ENLIVE) liquid 237 mL  237 mL Oral TID BM Gonzella Lex, MD   237 mL at 09/15/15 1030  . LORazepam (ATIVAN) tablet 0.5 mg  0.5 mg Oral BID PRN Clovis Fredrickson, MD   0.5 mg at 09/15/15 1029  . LORazepam (ATIVAN) tablet 1 mg  1 mg Oral QHS Clovis Fredrickson, MD   1 mg at 09/14/15 2122  . magnesium hydroxide (MILK OF MAGNESIA) suspension 30 mL   30 mL Oral Daily PRN Gonzella Lex, MD      . megestrol (MEGACE) tablet 40 mg  40 mg Oral Daily Tayshaun Kroh B Aaralyn Kil, MD   40 mg at 09/15/15 1028  . metoprolol tartrate (LOPRESSOR) tablet 25 mg  25 mg Oral Daily Marjie Skiff, MD   25 mg at 09/15/15 1028  . mirtazapine (REMERON) tablet 30 mg  30 mg Oral QHS Clovis Fredrickson, MD   30 mg at 09/14/15 2122  . ondansetron (ZOFRAN) tablet 4 mg  4 mg Oral Q8H PRN Chassie Pennix B Adlene Adduci, MD      . oxymetazoline (AFRIN) 0.05 % nasal spray 1 spray  1 spray Each Nare BID Marjie Skiff, MD   1 spray at 09/15/15 1028  . pantoprazole (PROTONIX) EC tablet 40 mg  40 mg Oral Daily Ritchard Paragas B An Schnabel, MD   40 mg at 09/15/15 1028  . prenatal multivitamin tablet 1 tablet  1 tablet Oral Q1200 Clovis Fredrickson, MD   1 tablet at 09/14/15 1229  . QUEtiapine (SEROQUEL XR) 24 hr tablet 200 mg  200 mg Oral QHS Gonzella Lex, MD   200 mg at 09/14/15 2122  . senna (SENOKOT) tablet 8.6 mg  1 tablet Oral Daily Clovis Fredrickson, MD   8.6 mg at 09/15/15 1028    Lab Results:  Results for orders placed or performed during the hospital encounter of 09/10/15 (from the past 48 hour(s))  Comprehensive metabolic panel     Status: Abnormal   Collection Time: 09/14/15 11:15 AM  Result Value Ref Range   Sodium 140 135 - 145 mmol/L   Potassium 3.7 3.5 - 5.1 mmol/L   Chloride 107 101 - 111 mmol/L   CO2 22 22 - 32 mmol/L   Glucose, Bld 89 65 - 99 mg/dL   BUN 13 6 - 20 mg/dL   Creatinine, Ser 0.40 (L) 0.44 - 1.00 mg/dL   Calcium 9.6 8.9 - 10.3 mg/dL   Total Protein 6.6 6.5 - 8.1 g/dL   Albumin 3.8 3.5 - 5.0 g/dL   AST 20 15 - 41 U/L   ALT 16 14 - 54 U/L   Alkaline Phosphatase 52 38 - 126 U/L   Total Bilirubin 0.2 (L) 0.3 - 1.2 mg/dL   GFR calc non Af Amer >60 >60 mL/min   GFR calc Af Amer >60 >60 mL/min    Comment: (NOTE) The eGFR has been calculated using the CKD EPI equation. This calculation has not been validated in all clinical situations. eGFR's  persistently <60 mL/min signify possible Chronic Kidney Disease.    Anion gap 11 5 - 15  Magnesium     Status: None   Collection Time: 09/14/15 11:15 AM  Result Value Ref Range   Magnesium 1.7 1.7 -  2.4 mg/dL  Phosphorus     Status: None   Collection Time: 09/14/15 11:15 AM  Result Value Ref Range   Phosphorus 3.0 2.5 - 4.6 mg/dL  Potassium     Status: None   Collection Time: 09/15/15  7:59 AM  Result Value Ref Range   Potassium 3.9 3.5 - 5.1 mmol/L  Magnesium     Status: None   Collection Time: 09/15/15  7:59 AM  Result Value Ref Range   Magnesium 2.1 1.7 - 2.4 mg/dL  Phosphorus     Status: None   Collection Time: 09/15/15  7:59 AM  Result Value Ref Range   Phosphorus 3.4 2.5 - 4.6 mg/dL  Comprehensive metabolic panel     Status: None   Collection Time: 09/15/15  7:59 AM  Result Value Ref Range   Sodium 138 135 - 145 mmol/L   Potassium 3.9 3.5 - 5.1 mmol/L   Chloride 105 101 - 111 mmol/L   CO2 26 22 - 32 mmol/L   Glucose, Bld 92 65 - 99 mg/dL   BUN 15 6 - 20 mg/dL   Creatinine, Ser 0.50 0.44 - 1.00 mg/dL   Calcium 9.4 8.9 - 10.3 mg/dL   Total Protein 6.8 6.5 - 8.1 g/dL   Albumin 3.9 3.5 - 5.0 g/dL   AST 23 15 - 41 U/L   ALT 20 14 - 54 U/L   Alkaline Phosphatase 52 38 - 126 U/L   Total Bilirubin 0.4 0.3 - 1.2 mg/dL   GFR calc non Af Amer >60 >60 mL/min   GFR calc Af Amer >60 >60 mL/min    Comment: (NOTE) The eGFR has been calculated using the CKD EPI equation. This calculation has not been validated in all clinical situations. eGFR's persistently <60 mL/min signify possible Chronic Kidney Disease.    Anion gap 7 5 - 15  CBC     Status: Abnormal   Collection Time: 09/15/15  7:59 AM  Result Value Ref Range   WBC 3.1 (L) 3.6 - 11.0 K/uL   RBC 4.20 3.80 - 5.20 MIL/uL   Hemoglobin 13.4 12.0 - 16.0 g/dL   HCT 40.2 35.0 - 47.0 %   MCV 95.7 80.0 - 100.0 fL   MCH 31.8 26.0 - 34.0 pg   MCHC 33.3 32.0 - 36.0 g/dL   RDW 13.6 11.5 - 14.5 %   Platelets 229 150 - 440  K/uL  Lipase, blood     Status: None   Collection Time: 09/15/15  7:59 AM  Result Value Ref Range   Lipase 32 11 - 51 U/L    Comment: Please note change in reference range.    Physical Findings: AIMS:  , ,  ,  ,    CIWA:    COWS:     Musculoskeletal: Strength & Muscle Tone: within normal limits Gait & Station: unsteady Patient leans: N/A  Psychiatric Specialty Exam: Review of Systems  Constitutional: Positive for weight loss and malaise/fatigue.  Gastrointestinal: Positive for abdominal pain.  Neurological: Positive for tremors.  All other systems reviewed and are negative.   Blood pressure 112/71, pulse 111, temperature 98.3 F (36.8 C), temperature source Oral, resp. rate 20, height _0  (1.6 m), weight 34.473 kg (76 lb), SpO2 99 %.Body mass index is 13.47 kg/(m^2).  General Appearance: Casual  Eye Contact::  Fair  Speech:  Garbled  Volume:  Decreased  Mood:  Anxious  Affect:  Depressed  Thought Process:  Disorganized  Orientation:  Full (Time, Place,  and Person)  Thought Content:  Delusions, Hallucinations: Auditory and Paranoid Ideation  Suicidal Thoughts:  No  Homicidal Thoughts:  No  Memory:  Immediate;   Fair Recent;   Fair Remote;   Fair  Judgement:  Impaired  Insight:  Shallow  Psychomotor Activity:  Psychomotor Retardation  Concentration:  Fair  Recall:  [place] of Knowledge:Fair  Language: Poor  Akathisia:  No  Handed:  Right  AIMS (if indicated):     Assets:  Communication Skills Desire for Improvement Financial Resources/Insurance Housing Resilience Social Support  ADL's:  Intact  Cognition: WNL  Sleep:  Number of Hours: 6.75   Treatment Plan Summary: Daily contact with patient to assess and evaluate symptoms and progress in treatment and Medication management   [name] is a 54 year old female with a history of psychotic depression and failure to thrive admitted after suicide attempt by medication overdose in the context of major  loss.  1. Suicidal ideation. She still has passive suicidal thoughts but is able to contract for safety in the hospital  2. Mood and psychosis. She was restarted on Seroquel and Remeron for depression and mood stabilization. We will lower Remeron 30 mg at bedtime to address restlessness.   3. Constipation. She is on bowel regimen.  4. Failure to thrive. He weighs 66 pounds only. During her previous admission she weighed 73 pounds. Her normal weight is 90 pounds. We started Megace and Ensure Plus. Dietary consult pending.  5. Tachycardia. We restarted Lopressor 25 mg daily for tachycardia.   6. ECT consult. Will ask Dr. Weber Cooks to evaluate this patient for ECT. The patient is afraid of anesthesia and does not want to consider ECT.  7. Fall risk. PT consult completed. Patient refuses to use a walker. She is weak today. 1:1 sitter at bed site.  8. Hypophosphatemia. Patient's phosphorous was replenished. Medicine and pharmacy consult is greatly appreciated.   9. Nasal congestion-we'll start Afrin which patient states she is used in the past.  10. Nausea. She will have Zofran available as needed. Will start Protonix as well.  11. Anxiety. We restarted Ativan 0.5 mg twice daily as needed for anxiety with additional dose at bedtime.   12. AMS. Will try to rule out UTI and hyponatremia.   13. Disposition. She will return home with her brother. She will follow up with [facility]. This is her fourth admission this year. She may qualify for ACT team.   Stevie Ertle 09/15/2015, 11:55 AM

## 2015-09-15 NOTE — Progress Notes (Signed)
D: Patient in room  Stumbling  About . Noted  To bable  When talking , catatonic movement. Patient lossing thought process when in conversation with Clinical research associate. Patient anxious and needy . Physical Therapy walked  With patient later in shift. Patient placed with 1:1 R: Instruction giving on medication , needing to continue to emphasis importance and indication . Encourage patient to come to staff for any concerns . A: Voice no concerns , continue to monitor

## 2015-09-15 NOTE — Progress Notes (Signed)
Patient denies SI/ HI and hallucinations. Patient is compliant with medications and is pleasant. Patient c/o of anxiety after night time medications. Nurse explained that ativan 1 mg was given as scheduled. Patient agreed that the medication needed time to work. Patient stated that she will work on deep breathing exercises and focus on positive thoughts. Q 15 min checks maintained. Will continue to monitor.

## 2015-09-15 NOTE — Plan of Care (Signed)
Problem: Ineffective individual coping Goal: LTG: Patient will report a decrease in negative feelings Outcome: Progressing Denies suicidal ideations      

## 2015-09-15 NOTE — Progress Notes (Signed)
Recreation Therapy Notes  Date: 11.01.16 Time: 3:00 pm Location: Craft Room  Group Topic: Self-expression  Goal Area(s) Addresses:  Patient will pick one color for each emotion listed. Patient will show where they feel that emotion on the worksheet.  Behavioral Response: Attentive, Left early  Intervention: Color Your Emotions  Activity: Patients were given a human body worksheet and instructed to pick a color for 5 different emotions and show on the worksheet where they feel those emotions.  Education: LRT educated patients on different forms of self-expression.  Education Outcome: Patient left before LRT educated group.  Clinical Observations/Feedback: Patient completed activity by picking a color for the 5 emotions and showing where she felt them on the worksheet. Patient did not contribute to group discussion. Patient left group at approximately 3:34 pm. Patient did not return to group.  Jacquelynn Cree, LRT/CTRS 09/15/2015 4:14 PM

## 2015-09-15 NOTE — Progress Notes (Signed)
Physical Therapy Treatment Patient Details Name: [name] [name] MRN: [id number] DOB: Oct 29, 1961 Today's Date: 09/15/2015    History of Present Illness Pt was admitted to behavioral med unit with severe major depression and suicideal idiations. Pt also with psychosis, constipation, FTT, and tachycardia. Pt with eating disorder and weighs 66#. She has had tachycardia at rest per medical record and per RN has had labs drawn to check electrolytes. PMH: schizophrenia, recurrent major depression, hypokalemia, and Crohn's disease. Pt endorses one fall in the last 12 months.    PT Comments    Pt demonstrates significant improvement in balance on this date. She scored 53/56 on BERG and 5TSTS decreased from 20 to 12 seconds. Balance issues and weakness appear to be more psychiatric in nature as objective measures would not improve in such a short time. Pt appears completely different than when previously evaluated. At this time pt has no further needs for physical therapy. She may still require intermittent assist from family with IADLs but no further PT follow-up. Pt will be discharged from PT services at this time.    Follow Up Recommendations  Supervision - Intermittent;No PT follow up (Assist for IADLs)     Equipment Recommendations  None recommended by PT    Recommendations for Other Services       Precautions / Restrictions Precautions Precautions: None Restrictions Weight Bearing Restrictions: No    Mobility  Bed Mobility Overal bed mobility: Independent                Transfers Overall transfer level: Independent               General transfer comment: Able to perform sit to stand without UE support. 5TSTS: 12.9 seconds. Decreased LE power noted however pt is steady and stable in standing  Ambulation/Gait Ambulation/Gait assistance: Supervision Ambulation Distance (Feet): 300 Feet Assistive device: None Gait Pattern/deviations: Step-through pattern Gait  velocity: WFL for full household mobility   General Gait Details: Pt is able to ambulate without assistive device. Able to perform horizontal and vertical head turns without LOB. Able to increase/decrease speed without lateral deviation. Modified DGI: 12/12. Pt appears to be paranoid during ambulation frequently looking over her shoulders   Stairs            Wheelchair Mobility    Modified Rankin (Stroke Patients Only)       Balance Overall balance assessment: Needs assistance   Sitting balance-Leahy Scale: Good       Standing balance-Leahy Scale: Good                 High Level Balance Comments: Performed modified DGI and BERG with patient to further assess balance    Cognition Arousal/Alertness: Awake/alert Behavior During Therapy: Flat affect;Restless Overall Cognitive Status: Difficult to assess                      Exercises      General Comments        Pertinent Vitals/Pain Pain Assessment: 0-10 Pain Location: Reports chronic stomach pain, does not rate    Home Living                      Prior Function            PT Goals (current goals can now be found in the care plan section) Acute Rehab PT Goals Patient Stated Goal: Pt unable to provide a goal at this time Progress towards PT goals:  Goals met/education completed, patient discharged from PT    Frequency  Other (Comment) (Discharge in house)    PT Plan Discharge plan needs to be updated    Co-evaluation             End of Session Equipment Utilized During Treatment: Gait belt Activity Tolerance: Patient tolerated treatment well Patient left: in chair;with nursing/sitter in room     Time: 7765-4868 PT Time Calculation (min) (ACUTE ONLY): 10 min  Charges:  $Neuromuscular Re-education: 8-22 mins                    G Codes:  Functional Assessment Tool Used: clinical judgement, single leg balance Functional Limitation: Mobility: Walking and moving  around Mobility: Walking and Moving Around Goal Status [phone]): At least 20 percent but less than 40 percent impaired, limited or restricted Mobility: Walking and Moving Around Discharge Status [phone]): At least 1 percent but less than 20 percent impaired, limited or restricted   Phillips Grout PT, DPT   Huprich,Jason 09/15/2015, 5:26 PM

## 2015-09-15 NOTE — Plan of Care (Signed)
Problem: Alteration in mood Goal: LTG-Patient reports reduction in suicidal thoughts (Patient reports reduction in suicidal thoughts and is able to verbalize a safety plan for whenever patient is feeling suicidal)  Outcome: Progressing Patient denies suicidal thoughts.     

## 2015-09-15 NOTE — Progress Notes (Signed)
MEDICATION RELATED CONSULT NOTE - Follow up  Pharmacy Consult for electrolyte replacement Indication: Low Phos  Allergies  Allergen Reactions  . Acyclovir     Other reaction(s): Other (See Comments) Orally, triggers a migraine  . Amoxicillin-Pot Clavulanate     REACTION: GI Upset  . Cefadroxil     Other reaction(s): Unknown  . Ciprofloxacin     REACTION: GI Upset.Also avelox and floxin  . Clarithromycin     REACTION: GI Upset  . Doxycycline     Other reaction(s): Unknown  . Erythromycin   . Levofloxacin   . Sulfamethoxazole-Trimethoprim     REACTION: itching  . Albumin (Human) Nausea And Vomiting  . Penicillins Nausea Only    Patient Measurements: Height:  (160 cm) Weight: 76 lb (34.473 kg) IBW/kg (Calculated) : 52.4   Vital Signs: Temp: 98.3 F (36.8 C) (11/01 0649) Temp Source: Oral (11/01 0649) BP: 112/71 mmHg (11/01 0649) Pulse Rate: 111 (11/01 0649) Intake/Output from previous day: 10/31 0701 - 11/01 0700 In: 240 [P.O.:240] Out: -  Intake/Output from this shift: Total I/O In: 240 [P.O.:240] Out: -   Labs:  Recent Labs  09/12/15 0950 09/13/15 0628 09/14/15 1115 09/15/15 0759  CREATININE  --   --  0.40*  --   MG 1.7  --  1.7 2.1  PHOS 2.0* 2.2* 3.0 3.4  ALBUMIN  --   --  3.8  --   PROT  --   --  6.6  --   AST  --   --  20  --   ALT  --   --  16  --   ALKPHOS  --   --  52  --   BILITOT  --   --  0.2*  --    Estimated Creatinine Clearance: 43.8 mL/min (by C-G formula based on Cr of 0.4).   Microbiology: No results found for this or any previous visit (from the past 720 hour(s)).  Medical History: Past Medical History  Diagnosis Date  . Ulcerative colitis ([facility])   . Osteoporosis   . Malnutrition ([facility])   . Alcoholism in remission [facility])      Assessment: 54 yo female currently s/p 5 doses of Phos-NaK packets. K 3.9, Phos 3.4, Mag 2.1  Plan:  Electrolytes are WNL today - no supplementation needed at this time.  Will sign off as  electrolytes WNL past two days.    [name] L 09/15/2015,9:33 AM

## 2015-09-15 NOTE — Plan of Care (Signed)
Problem: Diagnosis: Increased Risk For Suicide Attempt Goal: LTG-Patient Will Show Positive Response to Medication LTG (by discharge) : Patient will show positive response to medication and will participate in the development of the discharge plan.  Outcome: Progressing Patient is medication compliant and reports that voices have decreased. Goal: LTG-Patient Family Informed of Increased Suicide Risk LTG (by discharge) Patient's family or significant other informed of patient's increased risk for suicide and they will be able to verbalize ways they can help the patient stay safe after discharge.  Outcome: Progressing Patient has a sitter for safety at this time.

## 2015-09-16 MED ORDER — FOSFOMYCIN TROMETHAMINE 3 G PO PACK
3.0000 g | PACK | Freq: Once | ORAL | Status: AC
  Administered 2015-09-16: 3 g via ORAL
  Filled 2015-09-16: qty 3

## 2015-09-16 NOTE — Plan of Care (Signed)
Problem: Diagnosis: Increased Risk For Suicide Attempt Goal: LTG-Patient Will Show Positive Response to Medication LTG (by discharge) : Patient will show positive response to medication and will participate in the development of the discharge plan.  Outcome: Progressing Patient reports that medication is effective with decreasing her anxiety.  Goal: LTG-Patient Family Informed of Increased Suicide Risk LTG (by discharge) Patient's family or significant other informed of patient's increased risk for suicide and they will be able to verbalize ways they can help the patient stay safe after discharge.  Outcome: Progressing Patient denied SI during the shift.  Goal: LTG-Patient Will Report Improved Mood and Deny Suicidal LTG (by discharge) Patient will report improved mood and deny suicidal ideation.  Outcome: Progressing Patient reports decreased SI.  Goal: LTG-Patient Will Report Improvement in Psychotic Symptoms LTG (by discharge) : Patient will report improvement in psychotic symptoms.  Outcome: Progressing Patient report decreased anxiety symptoms.

## 2015-09-16 NOTE — Plan of Care (Signed)
Problem: Alteration in mood Goal: STG-Patient is able to discuss feelings and issues (Patient is able to discuss feelings and issues leading to depression)  Outcome: Not Progressing Pt. With little interest or energy in treatment plan, no improvement in mood.

## 2015-09-16 NOTE — Progress Notes (Signed)
[facility] MD Progress Note  09/16/2015 7:17 PM SHAQUASHA GERSTEL  MRN:  [id number]  Subjective:  Ms. Muzio feels better physically today. She no longer is nauseated. She ate 100% of her breakfast. She insists on eating in her room but we encourage meals at the dining area. She requests permission to drink more water. She is allowed 4 extra cups of water in addition to juice, milk and Ensure. Yesterday her electrolytes were unchanged. She still feels depressed and has passing suicidal thoughts. She is no longer confused. She is well groomed. She refused ECT on Monday in fear of anesthesia but is asking about treatment today. Her brother, the guardian, applied for Medicaid in order to place her. Sitter at bedside for confusion, falls, potomania, suicidal ideation.  Principal Problem: Severe recurrent major depression with psychotic features [facility]) Diagnosis:   Patient Active Problem List   Diagnosis Date Noted  . Failure to thrive in adult [R62.7] 09/11/2015  . Sedative intoxication ([place]) [T88.828] 09/10/2015  . Severe recurrent major depression with psychotic features ([place]) [F33.3] 09/10/2015  . Schizophrenia ([place]) [F20.9] 08/27/2015  . Anorexia nervosa [F50.00] 08/05/2015  . Hypokalemia [E87.6] 08/05/2015  . 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 PALPITATIONS [R00.2] 02/07/2010  . HERPES ZOSTER [B02.9] 02/08/2008  . MIGRAINE HEADACHE [G43.909] 02/08/2008  . 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 IBD [K52.89] 02/08/2008  . CONSTIPATION, CHRONIC [K59.09] 02/08/2008  . ANEMIA, IRON DEFICIENCY [D50.9] 01/25/2008  . 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 OSTEOARTHRITIS [M19.90] 01/25/2008  . POLYARTHRALGIA [M25.50] 01/25/2008  . OSTEOPOROSIS [M81.0] 01/25/2008   Total Time spent with patient: 20 minutes  Past Psychiatric History: depression.  Past Medical History:  Past Medical History  Diagnosis Date  . Ulcerative colitis ([place])   . Osteoporosis   .  Malnutrition ([place])   . Alcoholism in remission [facility])     Past Surgical History  Procedure Laterality Date  . Breast biopsy Bilateral   . Colonoscopy  1993    crohn's disease   Family History:  Family History  Problem Relation Age of Onset  . Colon cancer    . Diabetes    . Hypothyroidism     Family Psychiatric  History: none reported. Social History:  History  Alcohol Use No     History  Drug Use No    Social History   Social History  . Marital Status: Single    Spouse Name: N/A  . Number of Children: N/A  . Years of Education: N/A   Social History Main Topics  . Smoking status: Never Smoker   . Smokeless tobacco: None  . Alcohol Use: No  . Drug Use: No  . Sexual Activity: No   Other Topics Concern  . None   Social History Narrative   Additional Social History:                         Sleep: Good  Appetite:  Poor  Current Medications: Current Facility-Administered Medications  Medication Dose Route Frequency Provider Last Rate Last Dose  . acetaminophen (TYLENOL) tablet 650 mg  650 mg Oral Q6H PRN Gonzella Lex, MD      . alum & mag hydroxide-simeth (MAALOX/MYLANTA) 200-200-20 MG/5ML suspension 30 mL  30 mL Oral Q4H PRN Gonzella Lex, MD   30 mL at 09/14/15 1126  . docusate sodium (COLACE) capsule 100 mg  100 mg Oral BID Clovis Fredrickson, MD   100 mg at 09/16/15  0900  . feeding supplement (ENSURE ENLIVE) (ENSURE ENLIVE) liquid 237 mL  237 mL Oral TID BM Gonzella Lex, MD   237 mL at 09/16/15 1525  . LORazepam (ATIVAN) tablet 0.5 mg  0.5 mg Oral BID PRN Clovis Fredrickson, MD   0.5 mg at 09/16/15 1814  . LORazepam (ATIVAN) tablet 1 mg  1 mg Oral QHS Clovis Fredrickson, MD   1 mg at 09/15/15 2102  . magnesium hydroxide (MILK OF MAGNESIA) suspension 30 mL  30 mL Oral Daily PRN Gonzella Lex, MD      . megestrol (MEGACE) tablet 40 mg  40 mg Oral Daily Martavius Lusty B Copeland Neisen, MD   40 mg at 09/16/15 0900  . metoprolol tartrate (LOPRESSOR)  tablet 25 mg  25 mg Oral Daily Marjie Skiff, MD   25 mg at 09/16/15 0900  . mirtazapine (REMERON) tablet 30 mg  30 mg Oral QHS Clovis Fredrickson, MD   30 mg at 09/15/15 2103  . ondansetron (ZOFRAN) tablet 4 mg  4 mg Oral Q8H PRN Keaun Schnabel B Giancarlo Askren, MD      . oxymetazoline (AFRIN) 0.05 % nasal spray 1 spray  1 spray Each Nare BID Marjie Skiff, MD   1 spray at 09/16/15 0900  . pantoprazole (PROTONIX) EC tablet 40 mg  40 mg Oral Daily Arine Foley B Jelan Batterton, MD   40 mg at 09/16/15 0900  . prenatal multivitamin tablet 1 tablet  1 tablet Oral Q1200 Clovis Fredrickson, MD   1 tablet at 09/16/15 1126  . QUEtiapine (SEROQUEL XR) 24 hr tablet 200 mg  200 mg Oral QHS Gonzella Lex, MD   200 mg at 09/15/15 2103  . senna (SENOKOT) tablet 8.6 mg  1 tablet Oral Daily Latrelle Bazar B Armon Orvis, MD   8.6 mg at 09/16/15 0900    Lab Results:  Results for orders placed or performed during the hospital encounter of 09/10/15 (from the past 48 hour(s))  Potassium     Status: None   Collection Time: 09/15/15  7:59 AM  Result Value Ref Range   Potassium 3.9 3.5 - 5.1 mmol/L  Magnesium     Status: None   Collection Time: 09/15/15  7:59 AM  Result Value Ref Range   Magnesium 2.1 1.7 - 2.4 mg/dL  Phosphorus     Status: None   Collection Time: 09/15/15  7:59 AM  Result Value Ref Range   Phosphorus 3.4 2.5 - 4.6 mg/dL  Comprehensive metabolic panel     Status: None   Collection Time: 09/15/15  7:59 AM  Result Value Ref Range   Sodium 138 135 - 145 mmol/L   Potassium 3.9 3.5 - 5.1 mmol/L   Chloride 105 101 - 111 mmol/L   CO2 26 22 - 32 mmol/L   Glucose, Bld 92 65 - 99 mg/dL   BUN 15 6 - 20 mg/dL   Creatinine, Ser 0.50 0.44 - 1.00 mg/dL   Calcium 9.4 8.9 - 10.3 mg/dL   Total Protein 6.8 6.5 - 8.1 g/dL   Albumin 3.9 3.5 - 5.0 g/dL   AST 23 15 - 41 U/L   ALT 20 14 - 54 U/L   Alkaline Phosphatase 52 38 - 126 U/L   Total Bilirubin 0.4 0.3 - 1.2 mg/dL   GFR calc non Af Amer >60 >60 mL/min   GFR calc  Af Amer >60 >60 mL/min    Comment: (NOTE) The eGFR has been calculated using the CKD EPI equation.  This calculation has not been validated in all clinical situations. eGFR's persistently <60 mL/min signify possible Chronic Kidney Disease.    Anion gap 7 5 - 15  CBC     Status: Abnormal   Collection Time: 09/15/15  7:59 AM  Result Value Ref Range   WBC 3.1 (L) 3.6 - 11.0 K/uL   RBC 4.20 3.80 - 5.20 MIL/uL   Hemoglobin 13.4 12.0 - 16.0 g/dL   HCT 40.2 35.0 - 47.0 %   MCV 95.7 80.0 - 100.0 fL   MCH 31.8 26.0 - 34.0 pg   MCHC 33.3 32.0 - 36.0 g/dL   RDW 13.6 11.5 - 14.5 %   Platelets 229 150 - 440 K/uL  Lipase, blood     Status: None   Collection Time: 09/15/15  7:59 AM  Result Value Ref Range   Lipase 32 11 - 51 U/L    Comment: Please note change in reference range.  Urinalysis complete, with microscopic ([facility] only)     Status: Abnormal   Collection Time: 09/15/15 10:45 AM  Result Value Ref Range   Color, Urine STRAW (A) YELLOW   APPearance CLEAR (A) CLEAR   Glucose, UA NEGATIVE NEGATIVE mg/dL   Bilirubin Urine NEGATIVE NEGATIVE   Ketones, ur NEGATIVE NEGATIVE mg/dL   Specific Gravity, Urine 1.005 1.005 - 1.030   Hgb urine dipstick NEGATIVE NEGATIVE   pH 6.0 5.0 - 8.0   Protein, ur NEGATIVE NEGATIVE mg/dL   Nitrite NEGATIVE NEGATIVE   Leukocytes, UA 1+ (A) NEGATIVE   RBC / HPF 0-5 0 - 5 RBC/hpf   WBC, UA 0-5 0 - 5 WBC/hpf   Bacteria, UA NONE SEEN NONE SEEN   Squamous Epithelial / LPF NONE SEEN NONE SEEN    Physical Findings: AIMS:  , ,  ,  ,    CIWA:    COWS:     Musculoskeletal: Strength & Muscle Tone: within normal limits Gait & Station: unsteady Patient leans: N/A  Psychiatric Specialty Exam: Review of Systems  Psychiatric/Behavioral: The patient is nervous/anxious.   All other systems reviewed and are negative.   Blood pressure 111/75, pulse 105, temperature 98.6 F (37 C), temperature source Oral, resp. rate 20, height _0  (1.6 m), weight 35.381 kg (78  lb), SpO2 99 %.Body mass index is 13.82 kg/(m^2).  General Appearance: Casual  Eye Contact::  Fair  Speech:  Clear and Coherent  Volume:  Decreased  Mood:  Dysphoric  Affect:  Flat  Thought Process:  Goal Directed  Orientation:  Full (Time, Place, and Person)  Thought Content:  Delusions, Hallucinations: Auditory and Paranoid Ideation  Suicidal Thoughts:  Yes.  with intent/plan  Homicidal Thoughts:  No  Memory:  Immediate;   Fair Recent;   Fair Remote;   Fair  Judgement:  Impaired  Insight:  Shallow  Psychomotor Activity:  Decreased  Concentration:  Fair  Recall:  [place]: Fair  Akathisia:  No  Handed:  Right  AIMS (if indicated):     Assets:  Communication Skills Desire for Improvement Financial Resources/Insurance Housing Social Support  ADL's:  Intact  Cognition: WNL  Sleep:  Number of Hours: 7.25   Treatment Plan Summary: Daily contact with patient to assess and evaluate symptoms and progress in treatment and Medication management   Ms. Tabares is a 53 year old female with a history of psychotic depression and failure to thrive admitted after suicide attempt by medication overdose in the context of major loss.  1. Suicidal ideation. She still has passive suicidal thoughts but is able to contract for safety in the hospital  2. Mood and psychosis. She was restarted on Seroquel and Remeron for depression and mood stabilization. We will lower Remeron 30 mg at bedtime to address restlessness. Will offer Luvox for depression and anxiety.  3. Constipation. She is on bowel regimen.  4. Failure to thrive. He weighs 66 pounds only. During her previous admission she weighed 73 pounds. Her normal weight is 90 pounds. We started Megace and Ensure Plus. Dietary consult is appreciated.   5. Tachycardia. We restarted Lopressor 25 mg daily for tachycardia.   6. ECT consult. Will ask Dr. Weber Cooks to evaluate this patient for ECT since she is now  interested.   7. Fall risk. PT consult completed. Patient refuses to use a walker. 1:1 sitter at bedside.  8. Hypophosphatemia. Patient's phosphorous was replenished. Medicine and pharmacy consult is greatly appreciated.   9. Nasal congestion-we'll start Afrin which patient states she is used in the past.  10. Nausea. She will have Zofran available as needed. Will start Protonix as well.  11. Anxiety. We restarted Ativan 0.5 mg twice daily as needed for anxiety with additional dose at bedtime.   12. UTI. She received a single dose on Manurol.    13. Potomania. She has a history of excessive water intake and severe hyponatremia. Electroyites are normal.  She is on fluid restriction.  14. Disposition. TBE.     Stanislaw Acton 09/16/2015, 7:17 PM

## 2015-09-16 NOTE — Progress Notes (Signed)
Patient remains with depressed affect, low interest or plan of care and low energy. Minimal interaction with peers, must be encouraged to attend therapy groups. Encouraged to ambulate (slow and steady gait) to day room for meals with poor appetite. Ensure drinks as ordered are monitored and recorded. Patient takes prn Ativan 0.5 mg po for anxiety with good effect. Patient remains 1:1 to monitor water intake and is encouraged not to drink too much water. Brother calls and states he "would like to see sister this evening" and that he "would like to discuss discharge plan of care". MD aware. Safety maintained.

## 2015-09-16 NOTE — [facility] Group Notes (Signed)
[facility] LCSW Aftercare Discharge Planning Group Note   09/16/2015 12:59 PM  Participation Quality:  Minimal   Mood/Affect:  Flat  Depression Rating:  5  Anxiety Rating:  5  Thoughts of Suicide:  No Will you contract for safety?   NA  Current AVH:  NA  Plan for Discharge/Comments:  Pt plans to return home and follow up with [facility]. She reports feeling anxious today.   Transportation Means:  Brother    Supports:Brother/guardian   Jarin Cornfield [employer] MSW, [employer]

## 2015-09-16 NOTE — [facility] Group Notes (Signed)
[facility] Group Notes:  (Nursing/MHT/Case Management/Adjunct)  Date:  09/16/2015  Time:  5:12 AM  Type of Therapy:  Group Therapy  Participation Level:  Did Not Attend   Summary of Progress/Problems:  Veva Holes 09/16/2015, 5:12 AM

## 2015-09-16 NOTE — Progress Notes (Signed)
Patient alert oriented x3, calm and cooperative during the shift. Patient continues to need a 1:1 for safety concerns during the shift. Patient drank nutritional shake for bedtime snack. Patient endorsed feeling anxious and was administered  of ativan as scheduled. Patient was medication compliant during the shift.

## 2015-09-16 NOTE — Progress Notes (Signed)
Patient alert oriented x3, denies SI/HI/AVH during the shift. Patient was calm, cooperative and medication compliant during the shift. Patient interacted appropriately with peers and staff in the milieu. Patient continues to need sitter for safety and to monitor patient water intake.

## 2015-09-16 NOTE — Progress Notes (Signed)
Recreation Therapy Notes  Date: 11.02.16 Time: 3:00 pm Location: Craft Room  Group Topic: Self-esteem  Goal Area(s) Addresses:  Patient will identify at least one positive attribute about self. Patient will identify at least one coping skill.  Behavioral Response: Did not attend  Intervention: All About Me  Activity: Patients were instructed to make an All About Me pamphlet listing their life motto, positive traits, healthy coping skills, and their healthy support system.  Education: LRT educated patients on ways to increase their self-esteem.  Education Outcome: Patient did not attend group.  Clinical Observations/Feedback: Patient did not attend group.  Jacquelynn Cree, LRT/CTRS 09/16/2015 4:11 PM

## 2015-09-17 LAB — VITAMIN D 1,25 DIHYDROXY
VITAMIN D 1, 25 (OH) TOTAL: 61 pg/mL
Vitamin D2 1, 25 (OH)2: <10 pg/mL
Vitamin D3 1, 25 (OH)2: 60 pg/mL

## 2015-09-17 LAB — PTH, INTACT AND CALCIUM
Calcium, Total (PTH): 9.8 mg/dL (ref 8.7–10.2)
PTH: 15 pg/mL (ref 15–65)

## 2015-09-17 LAB — VITAMIN D 25 HYDROXY (VIT D DEFICIENCY, FRACTURES): Vit D, 25-Hydroxy: 39.4 ng/mL (ref 30.0–100.0)

## 2015-09-17 MED ORDER — QUETIAPINE FUMARATE 25 MG PO TABS
12.5000 mg | ORAL_TABLET | Freq: Every day | ORAL | Status: DC
  Administered 2015-09-17: 25 mg via ORAL
  Administered 2015-09-18: 12.5 mg via ORAL
  Filled 2015-09-17 (×2): qty 1

## 2015-09-17 MED ORDER — FLUVOXAMINE MALEATE 50 MG PO TABS
50.0000 mg | ORAL_TABLET | Freq: Every day | ORAL | Status: DC
  Administered 2015-09-17: 50 mg via ORAL
  Filled 2015-09-17: qty 1

## 2015-09-17 NOTE — Plan of Care (Signed)
Problem: Diagnosis: Increased Risk For Suicide Attempt Goal: LTG-Patient Will Report Absence of Withdrawal Symptoms LTG (by discharge): Patient will report absence of withdrawal symptoms.  Outcome: Progressing No symptoms of withdrawl Goal: STG-Patient Will Comply With Medication Regime Outcome: Progressing Med compliant

## 2015-09-17 NOTE — [facility] Group Notes (Signed)
[facility] Group Notes:  (Nursing/MHT/Case Management/Adjunct)  Date:  09/17/2015  Time:  7:18 AM  Type of Therapy:  Group Therapy  Participation Level:  Minimal  Participation Quality:  Appropriate  Affect:  Flat  Cognitive:  Appropriate  Insight:  Appropriate  Engagement in Group:  Engaged  Modes of Intervention:  n/a  Summary of Progress/Problems:  [name] [name] 09/17/2015, 7:18 AM

## 2015-09-17 NOTE — Progress Notes (Addendum)
[facility] MD Progress Note  09/17/2015 2:49 PM WANDALEE SPADEA  MRN:  [id number]  Subjective:  Ms. Beryl Meager reports severe anxiety today but overall she looks much better. She is not shaking or stuttering. She is completely aware of her surroundings and is able to hold and decent conversation. She accepted medication for urinary tract infection yesterday. He is asking for additional medication or anxiety. We started Luvox last night and we will increase it 200 mg today. She is inquiring about ECT. This was considered initially but it seems that the patient is making good progress in the hospital. The problem remains while at home. According to her brother who is the guardian she avoid any activity, takes no medication, and refuses food. The brother applied for Medicaid which is necessary to place this patient in the structured environment. I do not believe that she would do well at home. She has strained relationship with her brother who is her only support.  Principal Problem: Severe recurrent major depression with psychotic features [facility]) Diagnosis:   Patient Active Problem List   Diagnosis Date Noted  . Failure to thrive in adult [R62.7] 09/11/2015  . 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 Severe recurrent major depression with psychotic features ([facility]) [F33.3] 09/10/2015  . Schizophrenia ([facility]) [F20.9] 08/27/2015  . Anorexia nervosa [F50.00] 08/05/2015  . Hypokalemia [E87.6] 08/05/2015  . 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 PALPITATIONS [R00.2] 02/07/2010  . HERPES ZOSTER [B02.9] 02/08/2008  . MIGRAINE HEADACHE [G43.909] 02/08/2008  . 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 IBD [K52.89] 02/08/2008  . CONSTIPATION, CHRONIC [K59.09] 02/08/2008  . ANEMIA, IRON DEFICIENCY [D50.9] 01/25/2008  . 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 OSTEOARTHRITIS [M19.90] 01/25/2008  . POLYARTHRALGIA [M25.50] 01/25/2008  . OSTEOPOROSIS [M81.0] 01/25/2008    Total Time spent with patient: 20 minutes  Past Psychiatric History: Severe depression and anxiety.  Past Medical History:  Past Medical History  Diagnosis Date  . Ulcerative colitis ([facility])   . Osteoporosis   . Malnutrition ([facility])   . Alcoholism in remission [facility])     Past Surgical History  Procedure Laterality Date  . Breast biopsy Bilateral   . Colonoscopy  1993    crohn's disease   Family History:  Family History  Problem Relation Age of Onset  . Colon cancer    . Diabetes    . Hypothyroidism     Family Psychiatric  History: Father with mental illness. Social History:  History  Alcohol Use No     History  Drug Use No    Social History   Social History  . Marital Status: Single    Spouse Name: N/A  . Number of Children: N/A  . Years of Education: N/A   Social History Main Topics  . Smoking status: Never Smoker   . Smokeless tobacco: None  . Alcohol Use: No  . Drug Use: No  . Sexual Activity: No   Other Topics Concern  . None   Social History Narrative   Additional Social History:                         Sleep: Good  Appetite:  Fair  Current Medications: Current Facility-Administered Medications  Medication Dose Route Frequency Provider Last Rate Last Dose  . acetaminophen (TYLENOL) tablet 650 mg  650 mg Oral Q6H PRN Audery Amel, MD   650 mg at 09/17/15 0900  . alum & mag hydroxide-simeth (MAALOX/MYLANTA) 200-200-20 MG/5ML suspension  30 mL  30 mL Oral Q4H PRN Audery Amel, MD   30 mL at 09/14/15 1126  . docusate sodium (COLACE) capsule 100 mg  100 mg Oral BID Jolanta B Pucilowska, MD   100 mg at 09/17/15 0900  . feeding supplement (ENSURE ENLIVE) (ENSURE ENLIVE) liquid 237 mL  237 mL Oral TID BM Audery Amel, MD   237 mL at 09/17/15 1400  . LORazepam (ATIVAN) tablet 0.5 mg  0.5 mg Oral BID PRN Shari Prows, MD   0.5 mg at 09/17/15 1327  . LORazepam (ATIVAN) tablet 1 mg  1 mg Oral QHS Jolanta B Pucilowska, MD   1 mg at  09/16/15 2106  . magnesium hydroxide (MILK OF MAGNESIA) suspension 30 mL  30 mL Oral Daily PRN Audery Amel, MD      . megestrol (MEGACE) tablet 40 mg  40 mg Oral Daily Jolanta B Pucilowska, MD   40 mg at 09/17/15 0900  . metoprolol tartrate (LOPRESSOR) tablet 25 mg  25 mg Oral Daily Kerin Salen, MD   25 mg at 09/17/15 0900  . mirtazapine (REMERON) tablet 30 mg  30 mg Oral QHS Shari Prows, MD   30 mg at 09/16/15 2106  . ondansetron (ZOFRAN) tablet 4 mg  4 mg Oral Q8H PRN Jolanta B Pucilowska, MD      . oxymetazoline (AFRIN) 0.05 % nasal spray 1 spray  1 spray Each Nare BID Kerin Salen, MD   1 spray at 09/17/15 0900  . pantoprazole (PROTONIX) EC tablet 40 mg  40 mg Oral Daily Jolanta B Pucilowska, MD   40 mg at 09/17/15 0900  . prenatal multivitamin tablet 1 tablet  1 tablet Oral Q1200 Shari Prows, MD   1 tablet at 09/17/15 1237  . QUEtiapine (SEROQUEL XR) 24 hr tablet 200 mg  200 mg Oral QHS Audery Amel, MD   200 mg at 09/16/15 2106  . senna (SENOKOT) tablet 8.6 mg  1 tablet Oral Daily Jolanta B Pucilowska, MD   8.6 mg at 09/17/15 0900    Lab Results: No results found for this or any previous visit (from the past 48 hour(s)).  Physical Findings: AIMS:  , ,  ,  ,    CIWA:    COWS:     Musculoskeletal: Strength & Muscle Tone: within normal limits Gait & Station: normal Patient leans: N/A  Psychiatric Specialty Exam: Review of Systems  Psychiatric/Behavioral: The patient is nervous/anxious.   All other systems reviewed and are negative.   Blood pressure 115/77, pulse 103, temperature 98.6 F (37 C), temperature source Oral, resp. rate 20, height  (1.6 m), weight 35.381 kg (78 lb), SpO2 99 %.Body mass index is 13.82 kg/(m^2).  General Appearance: Casual  Eye Contact::  Good  Speech:  Slow  Volume:  Decreased  Mood:  Anxious  Affect:  Appropriate  Thought Process:  Goal Directed  Orientation:  Full (Time, Place, and Person)  Thought Content:   Delusions, Hallucinations: Auditory and Paranoid Ideation  Suicidal Thoughts:  No  Homicidal Thoughts:  No  Memory:  Immediate;   Fair Recent;   Fair Remote;   Fair  Judgement:  Impaired  Insight:  Lacking  Psychomotor Activity:  Decreased  Concentration:  Fair  Recall:  [employer] of Knowledge:Fair  Language: Fair  Akathisia:  No  Handed:  Right  AIMS (if indicated):     Assets:  Communication Skills Desire for Improvement Financial Resources/Insurance  Housing Resilience Social Support  ADL's:  Intact  Cognition: WNL  Sleep:  Number of Hours: 5.25   Treatment Plan Summary: Daily contact with patient to assess and evaluate symptoms and progress in treatment and Medication management   Ms. Hillmann is a 54 year old female with a history of psychotic depression and failure to thrive admitted after suicide attempt by medication overdose in the context of major loss.  1. Suicidal ideation. She still has passive suicidal thoughts but is able to contract for safety in the hospital  2. Mood and psychosis. She was restarted on Seroquel and Remeron for depression and mood stabilization. We will lower Remeron 30 mg at bedtime to address restlessness. Will start Luvox for depression and anxiety.  3. Constipation. She is on bowel regimen.  4. Failure to thrive. He weighs 66 pounds only. During her previous admission she weighed 73 pounds. Her normal weight is 90 pounds. We started Megace and Ensure Plus. Dietary consult is appreciated.   5. Tachycardia. We restarted Lopressor 25 mg daily for tachycardia.   6. ECT consult. Will ask Dr. [name] to evaluate this patient for ECT since she is now interested.   7. Fall risk. PT consult completed. Patient refuses to use a walker. 1:1 sitter at bedside.  8. Hypophosphatemia. Patient's phosphorous was replenished. Medicine and pharmacy consult is greatly appreciated.   9. Nasal congestion-we'll start Afrin which patient states she is used  in the past.  10. Nausea. Resolved. She has Zofran available as needed. We started Protonix as well.  11. Anxiety. We restarted Ativan 0.5 mg twice daily as needed for anxiety with additional dose at bedtime. Will offer low dose Lamictal during the day.  12. UTI. She received a single dose on Manurol.   13. Potomania. She has a history of excessive water intake and severe hyponatremia. Electroyites are normal. She is on fluid restriction.  14. Disposition. TBE.    Jolanta Pucilowska 09/17/2015, 2:49 PM

## 2015-09-17 NOTE — Tx Team (Signed)
Interdisciplinary Treatment Plan Update (Adult)  Date:  09/17/2015 Time Reviewed:  4:22 PM  Progress in Treatment: Attending groups: Yes. Participating in groups:  Yes. Taking medication as prescribed:  Yes. Tolerating medication:  Yes. Family/Significant othe contact made:  Yes, individual(s) contacted:  Brother/guardian  Patient understands diagnosis:  No. and As evidenced by:  Limited insight  Discussing patient identified problems/goals with staff:  Yes. Medical problems stabilized or resolved:  Yes. Denies suicidal/homicidal ideation: Yes. Issues/concerns per patient self-inventory:  Yes. Other:  New problem(s) identified: No, Describe:  NA  Discharge Plan or Barriers: Pt plans to return home and follow up with outpatient.    Reason for Continuation of Hospitalization: Anxiety Depression Medical Issues Medication stabilization  Comments: Ms. [name] reports severe anxiety today but overall she looks much better. She is not shaking or stuttering. She is completely aware of her surroundings and is able to hold and decent conversation. She accepted medication for urinary tract infection yesterday. He is asking for additional medication or anxiety. We started Luvox last night and we will increase it 200 mg today. She is inquiring about ECT. This was considered initially but it seems that the patient is making good progress in the hospital. The problem remains while at home. According to her brother who is the guardian she avoid any activity, takes no medication, and refuses food. The brother applied for Medicaid which is necessary to place this patient in the structured environment. I do not believe that she would do well at home. She has strained relationship with her brother who is her only support.  Estimated length of stay: 7 days   New goal(s):  Review of initial/current patient goals per problem list:   1.  Goal(s): Patient will participate in aftercare plan * Met:   * Target date: at discharge * As evidenced by: Patient will participate within aftercare plan AEB aftercare provider and housing plan at discharge being identified.   2.  Goal (s): Patient will exhibit decreased depressive symptoms and suicidal ideations. * Met:  *  Target date: at discharge * As evidenced by: Patient will utilize self rating of depression at 3 or below and demonstrate decreased signs of depression or be deemed stable for discharge by MD.   3.  Goal(s): Patient will demonstrate decreased signs and symptoms of anxiety. * Met:  * Target date: at discharge * As evidenced by: Patient will utilize self rating of anxiety at 3 or below and demonstrated decreased signs of anxiety, or be deemed stable for discharge by MD   Attendees: Patient:  [name] [name] 11/3/20164:22 PM  Family:   11/3/20164:22 PM  Physician:   Dr. Bary Leriche  11/3/20164:22 PM  Nursing:   Meredith Mody, RN  11/3/20164:22 PM  Case Manager:   11/3/20164:22 PM  Counselor:   11/3/20164:22 PM  Other:  Wray Kearns, [place]  11/3/20164:22 PM  Other:  Everitt Amber, [place]  11/3/20164:22 PM  Other:   11/3/20164:22 PM  Other:  11/3/20164:22 PM  Other:  11/3/20164:22 PM  Other:  11/3/20164:22 PM  Other:  11/3/20164:22 PM  Other:  11/3/20164:22 PM  Other:  11/3/20164:22 PM  Other:   11/3/20164:22 PM   Scribe for Treatment Team:   Wray Kearns, MSW, LCSWA  09/17/2015, 4:22 PM

## 2015-09-17 NOTE — Progress Notes (Signed)
Recreation Therapy Notes  Date: 11.03.16 Time: 3:00 pm Location: Craft Room  Group Topic: Leisure Education  Goal Area(s) Addresses:  Patient will identify things they are grateful for. Patient will identify how being grateful can influence decision making.  Behavioral Response: Attentive Intervention: Lexicographer  Activity: Patients were given an I am Grateful For worksheet and instructed to think of 2-3 things they were grateful for under each category.  Education: LRT educated patients on why it is important to be grateful.  Education Outcome: In group clarification offered  Clinical Observations/Feedback: Patient completed activity by writing things she was grateful for. Patient did not contribute to group discussion.  Jacquelynn Cree, LRT/CTRS 09/17/2015 4:15 PM

## 2015-09-17 NOTE — [facility] Group Notes (Signed)
[facility] Group Notes:  (Nursing/MHT/Case Management/Adjunct)  Date:  09/17/2015  Time:  2:29 PM  Type of Therapy:  Group Therapy  Participation Level:  Did Not Attend  Avin Upperman De'Chelle Laketia Vicknair 09/17/2015, 2:29 PM

## 2015-09-17 NOTE — Progress Notes (Signed)
Remains with sitter 1:1. Complaints of headache earlier in shift, prn given with good relief. Complaints of anxiety, prn given. Flat, sad affect. Med compliant. Did attend group. Eating meals and drinking supplement. Pt states want to talk about having ECT with MD. Encouraged pt to eat, continue to monitor water intake. No negative behaviors thus far this shift. Will continue to monitor and assess for safety

## 2015-09-18 MED ORDER — FLUVOXAMINE MALEATE 50 MG PO TABS
100.0000 mg | ORAL_TABLET | Freq: Every day | ORAL | Status: DC
  Administered 2015-09-18 – 2015-09-20 (×3): 100 mg via ORAL
  Filled 2015-09-18 (×3): qty 2

## 2015-09-18 NOTE — Progress Notes (Signed)
Recreation Therapy Notes  Date: 11.04.16 Time: 3:00 pm Location: Craft Room  Group Topic: Coping Skills  Goal Area(s) Addresses:  Patient will participate in coping skill. Patient will verbalize benefit of using art as a coping skill.  Behavioral Response: Did not attend  Intervention: Coloring  Activity: Patients were given coloring sheets and instructed to color and focus on what they were feeling while they were coloring.  Education: LRT educated patients on healthy coping skills.  Education Outcome: Patient did not attend group.   Clinical Observations/Feedback: Patient did not attend group.   Jacquelynn Cree, LRT/CTRS 09/18/2015 3:55 PM

## 2015-09-18 NOTE — [facility] Group Notes (Signed)
[facility] Group Notes:  (Nursing/MHT/Case Management/Adjunct)  Date:  09/18/2015  Time:  12:37 AM  Type of Therapy:  Psychoeducational Skills  Participation Level:  Did Not Attend   Summary of Progress/Problems:  Foy [place] 09/18/2015, 12:37 AM

## 2015-09-18 NOTE — Progress Notes (Signed)
   09/18/15 1500  Clinical Encounter Type  Visited With Patient  Visit Type Initial  Referral From Patient  Consult/Referral To Chaplain  Spiritual Encounters  Spiritual Needs Prayer  Chaplain provided prayer and a compassionate presence for patient.   Chaplain Shernita Rabinovich [phone]

## 2015-09-18 NOTE — Progress Notes (Addendum)
[facility] MD Progress Note  09/18/2015 12:47 PM [name] [name]  MRN:  [id number]  Subjective:  [name] is a 54 year old female with a history of severe depression and anxiety admitted for worsening depression, high anxiety, weight loss, and inability to care for herself. She was hospitalized here before in a similar scenario. Family reports that the patient has always been dysfunctional and dependent on her father. Following his death the brother became the caretaker. The patient has a history of posttraumatic and hyponatremia. She has sitter at bedside for fall risk and excessive drinking.  [name] is preoccupied with the ECT treatment today. Initially she refused treatment in fear of anesthesia. Now she wants to consider it. She is not a good candidate for ECT. First of all she is noncompliant in the community. She also has no transportation to come for outpatient ECT but most of all she improved nicely, she is no longer suicidal, but she is not excessively anxious, she eats her meals in the dayroom, she attends group with encouragement. At home she is reclusive and does not interact with her brother at all. She should be placed but she doesn't have Medicaid as of yet. Anticipated discharge early next week.  She denies any side effects from Luvox that was started last night on low-dose Seroquel during the day as needed for anxiety.  Principal Problem: Severe recurrent major depression with psychotic features [facility]) Diagnosis:   Patient Active Problem List   Diagnosis Date Noted  . Failure to thrive in adult [R62.7] 09/11/2015  . 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 Severe recurrent major depression with psychotic features (HCC) [F33.3] 09/10/2015  . Schizophrenia (HCC) [F20.9] 08/27/2015  . Anorexia nervosa [F50.00] 08/05/2015  . Hypokalemia [E87.6] 08/05/2015  . 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 PALPITATIONS [R00.2] 02/07/2010  . HERPES ZOSTER [B02.9]  02/08/2008  . MIGRAINE HEADACHE [G43.909] 02/08/2008  . 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 IBD [K52.89] 02/08/2008  . CONSTIPATION, CHRONIC [K59.09] 02/08/2008  . ANEMIA, IRON DEFICIENCY [D50.9] 01/25/2008  . 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 OSTEOARTHRITIS [M19.90] 01/25/2008  . POLYARTHRALGIA [M25.50] 01/25/2008  . OSTEOPOROSIS [M81.0] 01/25/2008   Total Time spent with patient: 20 minutes  Past Psychiatric History: Severe depression and anxiety  Past Medical History:  Past Medical History  Diagnosis Date  . Ulcerative colitis (HCC)   . Osteoporosis   . Malnutrition (HCC)   . Alcoholism in remission [facility])     Past Surgical History  Procedure Laterality Date  . Breast biopsy Bilateral   . Colonoscopy  1993    crohn's disease   Family History:  Family History  Problem Relation Age of Onset  . Colon cancer    . Diabetes    . Hypothyroidism     Family Psychiatric  History: Father with similar problems.   Social History:  History  Alcohol Use No     History  Drug Use No    Social History   Social History  . Marital Status: Single    Spouse Name: N/A  . Number of Children: N/A  . Years of Education: N/A   Social History Main Topics  . Smoking status: Never Smoker   . Smokeless tobacco: None  . Alcohol Use: No  . Drug Use: No  . Sexual Activity: No   Other Topics Concern  . None   Social History Narrative   Additional Social History:  Sleep: Good  Appetite:  Fair  Current Medications: Current Facility-Administered Medications  Medication Dose Route Frequency Provider Last Rate Last Dose  . acetaminophen (TYLENOL) tablet 650 mg  650 mg Oral Q6H PRN Audery Amel, MD   650 mg at 09/18/15 0636  . alum & mag hydroxide-simeth (MAALOX/MYLANTA) 200-200-20 MG/5ML suspension 30 mL  30 mL Oral Q4H PRN Audery Amel, MD   30 mL at 09/14/15 1126  . docusate sodium (COLACE) capsule 100 mg   100 mg Oral BID Shari Prows, MD   100 mg at 09/18/15 0948  . feeding supplement (ENSURE ENLIVE) (ENSURE ENLIVE) liquid 237 mL  237 mL Oral TID BM Audery Amel, MD   237 mL at 09/17/15 2000  . fluvoxaMINE (LUVOX) tablet 50 mg  50 mg Oral QHS Shari Prows, MD   50 mg at 09/17/15 2116  . LORazepam (ATIVAN) tablet 0.5 mg  0.5 mg Oral BID PRN Shari Prows, MD   0.5 mg at 09/18/15 0943  . LORazepam (ATIVAN) tablet 1 mg  1 mg Oral QHS Shari Prows, MD   1 mg at 09/17/15 2117  . magnesium hydroxide (MILK OF MAGNESIA) suspension 30 mL  30 mL Oral Daily PRN Audery Amel, MD      . megestrol (MEGACE) tablet 40 mg  40 mg Oral Daily Mahmud Keithly B Xerxes Agrusa, MD   40 mg at 09/18/15 0946  . metoprolol tartrate (LOPRESSOR) tablet 25 mg  25 mg Oral Daily Kerin Salen, MD   25 mg at 09/18/15 0946  . mirtazapine (REMERON) tablet 30 mg  30 mg Oral QHS Shari Prows, MD   30 mg at 09/17/15 2116  . ondansetron (ZOFRAN) tablet 4 mg  4 mg Oral Q8H PRN Tekeshia Klahr B Diangelo Radel, MD      . oxymetazoline (AFRIN) 0.05 % nasal spray 1 spray  1 spray Each Nare BID Kerin Salen, MD   1 spray at 09/18/15 [phone]  . pantoprazole (PROTONIX) EC tablet 40 mg  40 mg Oral Daily Shari Prows, MD   40 mg at 09/18/15 0947  . prenatal multivitamin tablet 1 tablet  1 tablet Oral Q1200 Shari Prows, MD   1 tablet at 09/18/15 1201  . QUEtiapine (SEROQUEL XR) 24 hr tablet 200 mg  200 mg Oral QHS Audery Amel, MD   200 mg at 09/17/15 2117  . QUEtiapine (SEROQUEL) tablet 12.5 mg  12.5 mg Oral QHS Shari Prows, MD   25 mg at 09/17/15 2116  . senna (SENOKOT) tablet 8.6 mg  1 tablet Oral Daily Chanti Golubski B Rayhaan Huster, MD   8.6 mg at 09/18/15 0947    Lab Results: No results found for this or any previous visit (from the past 48 hour(s)).  Physical Findings: AIMS:  , ,  ,  ,    CIWA:    COWS:     Musculoskeletal: Strength & Muscle Tone: within normal limits Gait & Station:  normal Patient leans: N/A  Psychiatric Specialty Exam: Review of Systems  Gastrointestinal: Positive for constipation.  Psychiatric/Behavioral: Positive for depression. The patient is nervous/anxious.   All other systems reviewed and are negative.   Blood pressure 128/87, pulse 102, temperature 97.7 F (36.5 C), temperature source Oral, resp. rate 20, height  (1.6 m), weight 35.381 kg (78 lb), SpO2 99 %.Body mass index is 13.82 kg/(m^2).  General Appearance: Casual  Eye Contact::  Good  Speech:  Normal Rate  Volume:  Normal  Mood:  Anxious  Affect:  Flat  Thought Process:  Goal Directed  Orientation:  Full (Time, Place, and Person)  Thought Content:  Delusions and Paranoid Ideation  Suicidal Thoughts:  No  Homicidal Thoughts:  No  Memory:  Immediate;   Fair Recent;   Fair Remote;   Fair  Judgement:  Impaired  Insight:  Lacking  Psychomotor Activity:  Decreased  Concentration:  Fair  Recall:  [employer] of Knowledge:Fair  Language: Fair  Akathisia:  No  Handed:  Right  AIMS (if indicated):     Assets:  Communication Skills Desire for Improvement Financial Resources/Insurance Housing Social Support  ADL's:  Intact  Cognition: WNL  Sleep:  Number of Hours: 6.5   Treatment Plan Summary: Daily contact with patient to assess and evaluate symptoms and progress in treatment and Medication management   Ms. Murdick is a 54 year old female with a history of psychotic depression and failure to thrive admitted after suicide attempt by medication overdose in the context of major loss.  1. Suicidal ideation. She still has passive suicidal thoughts but is able to contract for safety in the hospital  2. Mood and psychosis. She was restarted on Seroquel and Remeron for depression and mood stabilization. We will lower Remeron 30 mg at bedtime to address restlessness. We increased Luvox to 100 mg for depression and anxiety.  3. Constipation. She is on bowel regimen.  4.  Failure to thrive. He weighs 66 pounds only. During her previous admission she weighed 73 pounds. Her normal weight is 90 pounds. We started Megace and Ensure Plus. Dietary consult is appreciated. Her current weight is 78 lbs.    5. Tachycardia. We restarted Lopressor 25 mg daily for tachycardia.   6. ECT consult. The patient is not a good candidate for ECT as she has improved dramatically.   7. Fall risk. PT consult completed. Patient refuses to use a walker.  discontinue sitter.  8. Hypophosphatemia. Patient's phosphorous was replenished. Medicine and pharmacy consult is greatly appreciated.   9. Nasal congestion-we'll start Afrin which patient states she is used in the past.  10. Nausea. Resolved. She has Zofran available as needed. We started Protonix as well.  11. Anxiety. We restarted Ativan 0.5 mg twice daily as needed for anxiety with additional dose at bedtime. Will offer low dose Seroquel during the day.  12. UTI. She received a single dose on Manurol.   13. Potomania. She has a history of excessive water intake and severe hyponatremia. Electroyites are normal. She is on fluid restriction.  14. Disposition. TBE.    Elle Vezina 09/18/2015, 12:47 PM

## 2015-09-18 NOTE — [facility] Group Notes (Signed)
[facility] Group Notes:  (Nursing/MHT/Case Management/Adjunct)  Date:  09/18/2015  Time:  11:54 AM  Type of Therapy:  Group Therapy  Participation Level:  None  Participation Quality:  Redirectable and Resistant  Affect:  Anxious and Resistant  Cognitive:  Alert, Appropriate and Oriented  Insight:  Limited  Engagement in Group:  Poor  Modes of Intervention:  Activity  Summary of Progress/Problems: pt is very anxious, pt likes to isolate in her room.   Less Woolsey De'Chelle Janes [place] 09/18/2015, 11:54 AM

## 2015-09-18 NOTE — Plan of Care (Signed)
Problem: Alteration in mood Goal: LTG-Patient reports reduction in suicidal thoughts (Patient reports reduction in suicidal thoughts and is able to verbalize a safety plan for whenever patient is feeling suicidal)  Outcome: Progressing Denies suicidal ideation.     

## 2015-09-18 NOTE — Plan of Care (Signed)
Problem: Diagnosis: Increased Risk For Suicide Attempt Goal: LTG-Patient Will Show Positive Response to Medication LTG (by discharge) : Patient will show positive response to medication and will participate in the development of the discharge plan.  Outcome: Progressing Pt able to rest after having a evening filled with anxiety.  Goal: LTG-Patient Will Report Improved Mood and Deny Suicidal LTG (by discharge) Patient will report improved mood and deny suicidal ideation.  Outcome: Not Progressing Patient states "I don't feel good". Exhibiting signs of paranoia. Agitated. Attention seeking.  Goal: STG-Patient Will Comply With Medication Regime Outcome: Progressing Med compliant after significant effort and explanation.

## 2015-09-18 NOTE — Progress Notes (Signed)
She denies suicidal ideation but anxious & fearful.She told her brother not to come today sine she won't be in a good shape to see.Insisting staff for ativan.Attended groups.Minimal interaction with peers.Compliant with meds.

## 2015-09-19 NOTE — Progress Notes (Signed)
D: Patient has complained of feeling "sick" most of the day and has needed encouragement to get out of bed for meals. She has asked to eat her meals in her room but was told that it was the unit's policy for patients to eat in the dayroom. Has been given Ensure between meals and drank that.  A: On q 15 minute checks. Given meds. R: Poor appetite.

## 2015-09-19 NOTE — Plan of Care (Signed)
Problem: Diagnosis: Increased Risk For Suicide Attempt Goal: LTG-Patient Will Report Improved Mood and Deny Suicidal LTG (by discharge) Patient will report improved mood and deny suicidal ideation.  Outcome: Progressing Patient denies current SI.  Goal: LTG-Patient Will Report Absence of Withdrawal Symptoms LTG (by discharge): Patient will report absence of withdrawal symptoms.  Outcome: Progressing No withdrawal symptoms.  Goal: STG-Patient Will Attend All Groups On The Unit Outcome: Progressing Patient attended group.  Goal: STG-Patient Will Report Suicidal Feelings to Staff Outcome: Progressing Patient denies current SI, and agrees to discuss with staff if any SI arises.  Goal: STG-Patient Will Comply With Medication Regime Outcome: Progressing Patient does comply with medication although she is hesitant and taking the pills is noted to be emotionally difficult for her.

## 2015-09-19 NOTE — Progress Notes (Signed)
D:  Per pt she is still having a depressed mood, and feeling anxious, flat/anxious affect, endorses SI on and off, contracts for safety, denies HI/AVH.     A:  Emotional support provided, Encouraged pt to continue with treatment plan and attend all group activities, q15 min checks maintained for safety.  R:  Pt is isolative in room, needs much encouragement to come out of room, pt needed much encouragement to take her Luvox at bedtime, not going to groups, decreased appetite.

## 2015-09-19 NOTE — [facility] Group Notes (Signed)
[facility] LCSW Group Therapy  09/19/2015 2:18 PM  Type of Therapy: Group Therapy  Participation Level: Did Not Attend  Modes of Intervention: Discussion, Education, Socialization and Support  Summary of Progress/Problems: Todays topic: Grudges Patients will be encouraged to discuss their thoughts, feelings, and behaviors as to why one holds on to grudges and reasons why people have grudges. Patients will process the impact of grudges on their daily lives and identify thoughts and feelings related to holding grudges. Patients will identify feelings and thoughts related to what life would look like without grudges.   Ell Tiso L Kaimen Peine MSW, LCSWA  09/19/2015, 2:18 PM 

## 2015-09-19 NOTE — [facility] Group Notes (Signed)
[facility] LCSW Aftercare Discharge Planning Group Note   09/19/2015 11:24 AM (Late entry for 09/18/2015)   Participation Quality:  Minimal   Mood/Affect:  Anxious  Depression Rating:  5  Anxiety Rating:  5  Thoughts of Suicide:  No Will you contract for safety?   NA  Current AVH:  NA  Plan for Discharge/Comments:  Pt states she does not know where is going to stay upon discharge. However, her brother is her guardian and the plan is for her to return home. She states her goal for the day is to "stay calm."   Transportation Means: Brother   Supports: Brother   [name] [name] [employer] MSW, [employer]

## 2015-09-19 NOTE — Progress Notes (Signed)
[facility] MD Progress Note  09/19/2015 3:41 PM [name] [name]  MRN:  [id number]  Subjective:  [name] is a 54 year old female with a history of severe depression and anxiety admitted for worsening depression, high anxiety, weight loss, and inability to care for herself. She was seen for follow-up. She reported that she is unable to eat and is currently experiencing abdominal pain. Patient reported that she ate only a portion of her lunch and was unable to eat any breakfast. Patient reported that she has been getting the Zofran for her nausea and she feels nauseous all the time. Patient reported that the lorazepam is helping with her anxiety. She reported that she is also trying to take her medications. She has several stressors at this time. She is also being monitored closely by the staff. The patient has a history of posttraumatic and hyponatremia. She has sitter at bedside for fall risk and excessive drinking.  [name] is preoccupied with the ECT treatment today. Initially she refused treatment in fear of anesthesia. Now she wants to consider it. She is not a good candidate for ECT. She denies any side effects from Luvox that was started last night on low-dose Seroquel during the day as needed for anxiety.  Principal Problem: Severe recurrent major depression with psychotic features [facility]) Diagnosis:   Patient Active Problem List   Diagnosis Date Noted  . Failure to thrive in adult [R62.7] 09/11/2015  . 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 Severe recurrent major depression with psychotic features (HCC) [F33.3] 09/10/2015  . Schizophrenia (HCC) [F20.9] 08/27/2015  . Anorexia nervosa [F50.00] 08/05/2015  . Hypokalemia [E87.6] 08/05/2015  . 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 PALPITATIONS [R00.2] 02/07/2010  . HERPES ZOSTER [B02.9] 02/08/2008  . MIGRAINE HEADACHE [G43.909] 02/08/2008  . 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 IBD [K52.89] 02/08/2008  .  CONSTIPATION, CHRONIC [K59.09] 02/08/2008  . ANEMIA, IRON DEFICIENCY [D50.9] 01/25/2008  . 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 OSTEOARTHRITIS [M19.90] 01/25/2008  . POLYARTHRALGIA [M25.50] 01/25/2008  . OSTEOPOROSIS [M81.0] 01/25/2008   Total Time spent with patient: 20 minutes  Past Psychiatric History: Severe depression and anxiety  Past Medical History:  Past Medical History  Diagnosis Date  . Ulcerative colitis (HCC)   . Osteoporosis   . Malnutrition (HCC)   . Alcoholism in remission [facility])     Past Surgical History  Procedure Laterality Date  . Breast biopsy Bilateral   . Colonoscopy  1993    crohn's disease   Family History:  Family History  Problem Relation Age of Onset  . Colon cancer    . Diabetes    . Hypothyroidism     Family Psychiatric  History: Father with similar problems.   Social History:  History  Alcohol Use No     History  Drug Use No    Social History   Social History  . Marital Status: Single    Spouse Name: N/A  . Number of Children: N/A  . Years of Education: N/A   Social History Main Topics  . Smoking status: Never Smoker   . Smokeless tobacco: None  . Alcohol Use: No  . Drug Use: No  . Sexual Activity: No   Other Topics Concern  . None   Social History Narrative   Additional Social History:                         Sleep: Good  Appetite:  Fair  Current Medications: Current Facility-Administered Medications  Medication Dose Route Frequency Provider Last Rate Last Dose  . acetaminophen (TYLENOL) tablet 650 mg  650 mg Oral Q6H PRN Audery Amel, MD   650 mg at 09/18/15 0636  . alum & mag hydroxide-simeth (MAALOX/MYLANTA) 200-200-20 MG/5ML suspension 30 mL  30 mL Oral Q4H PRN Audery Amel, MD   30 mL at 09/14/15 1126  . docusate sodium (COLACE) capsule 100 mg  100 mg Oral BID Jolanta B Pucilowska, MD   100 mg at 09/19/15 1006  . feeding supplement (ENSURE ENLIVE) (ENSURE ENLIVE) liquid 237  mL  237 mL Oral TID BM Audery Amel, MD   237 mL at 09/19/15 1459  . fluvoxaMINE (LUVOX) tablet 100 mg  100 mg Oral QHS Shari Prows, MD   100 mg at 09/18/15 2108  . LORazepam (ATIVAN) tablet 0.5 mg  0.5 mg Oral BID PRN Shari Prows, MD   0.5 mg at 09/18/15 1700  . LORazepam (ATIVAN) tablet 1 mg  1 mg Oral QHS Shari Prows, MD   1 mg at 09/18/15 2108  . magnesium hydroxide (MILK OF MAGNESIA) suspension 30 mL  30 mL Oral Daily PRN Audery Amel, MD      . megestrol (MEGACE) tablet 40 mg  40 mg Oral Daily Jolanta B Pucilowska, MD   40 mg at 09/19/15 1006  . metoprolol tartrate (LOPRESSOR) tablet 25 mg  25 mg Oral Daily Kerin Salen, MD   25 mg at 09/19/15 1006  . mirtazapine (REMERON) tablet 30 mg  30 mg Oral QHS Shari Prows, MD   30 mg at 09/18/15 2108  . ondansetron (ZOFRAN) tablet 4 mg  4 mg Oral Q8H PRN Shari Prows, MD   4 mg at 09/19/15 0801  . oxymetazoline (AFRIN) 0.05 % nasal spray 1 spray  1 spray Each Nare BID Kerin Salen, MD   1 spray at 09/19/15 1006  . pantoprazole (PROTONIX) EC tablet 40 mg  40 mg Oral Daily Jolanta B Pucilowska, MD   40 mg at 09/19/15 1006  . prenatal multivitamin tablet 1 tablet  1 tablet Oral Q1200 Shari Prows, MD   1 tablet at 09/19/15 1222  . QUEtiapine (SEROQUEL XR) 24 hr tablet 200 mg  200 mg Oral QHS Audery Amel, MD   200 mg at 09/18/15 2108  . senna (SENOKOT) tablet 8.6 mg  1 tablet Oral Daily Jolanta B Pucilowska, MD   8.6 mg at 09/19/15 1006    Lab Results: No results found for this or any previous visit (from the past 48 hour(s)).  Physical Findings: AIMS:  , ,  ,  ,    CIWA:    COWS:     Musculoskeletal: Strength & Muscle Tone: within normal limits Gait & Station: normal Patient leans: N/A  Psychiatric Specialty Exam: Review of Systems  Gastrointestinal: Positive for constipation.  Psychiatric/Behavioral: Positive for depression. The patient is nervous/anxious.   All other  systems reviewed and are negative.   Blood pressure 121/79, pulse 93, temperature 99.1 F (37.3 C), temperature source Oral, resp. rate 20, height  (1.6 m), weight 78 lb (35.381 kg), SpO2 98 %.Body mass index is 13.82 kg/(m^2).  General Appearance: Casual  Eye Contact::  Good  Speech:  Normal Rate  Volume:  Normal  Mood:  Anxious  Affect:  Flat  Thought Process:  Goal Directed  Orientation:  Full (Time, Place, and Person)  Thought Content:  Delusions and Paranoid Ideation  Suicidal Thoughts:  No  Homicidal Thoughts:  No  Memory:  Immediate;   Fair Recent;   Fair Remote;   Fair  Judgement:  Impaired  Insight:  Lacking  Psychomotor Activity:  Decreased  Concentration:  Fair  Recall:  [employer] of Knowledge:Fair  Language: Fair  Akathisia:  No  Handed:  Right  AIMS (if indicated):     Assets:  Communication Skills Desire for Improvement Financial Resources/Insurance Housing Social Support  ADL's:  Intact  Cognition: WNL  Sleep:  Number of Hours: 7.75   Treatment Plan Summary: Daily contact with patient to assess and evaluate symptoms and progress in treatment and Medication management   [name] [name] is a 54 year old female with a history of psychotic depression and failure to thrive admitted after suicide attempt by medication overdose in the context of major loss.  1. Suicidal ideation. She still has passive suicidal thoughts but is able to contract for safety in the hospital  2. Mood and psychosis. She was restarted on Seroquel and Remeron for depression and mood stabilization. We will lower Remeron 30 mg at bedtime to address restlessness. We increased Luvox to 100 mg for depression and anxiety. Discussed with her about the adverse effects of the medication and she demonstrated understanding.  3. Constipation. She is on bowel regimen.  4. Failure to thrive. He weighs 66 pounds only. During her previous admission she weighed 73 pounds. Her normal weight is 90  pounds. We started Megace and Ensure Plus. Dietary consult is appreciated. Her current weight is 78 lbs.    5. Tachycardia. We restarted Lopressor 25 mg daily for tachycardia.   6. ECT consult. The patient is not a good candidate for ECT as she has improved dramatically.   7. Fall risk. PT consult completed. Patient refuses to use a walker.  discontinue sitter.  8. Hypophosphatemia. Patient's phosphorous was replenished. Medicine and pharmacy consult is greatly appreciated.   9. Nasal congestion-we'll start Afrin which patient states she is used in the past.  10. Nausea. Resolved. She has Zofran available as needed. We started Protonix as well.  11. Anxiety. We restarted Ativan 0.5 mg twice daily as needed for anxiety with additional dose at bedtime. Will offer low dose Seroquel during the day.  12. UTI. She received a single dose on Manurol.   13. Potomania. She has a history of excessive water intake and severe hyponatremia. Electroyites are normal. She is on fluid restriction.  14. Disposition. TBE.    Brandy Hale 09/19/2015, 3:41 PM

## 2015-09-19 NOTE — Plan of Care (Signed)
Problem: Ineffective individual coping Goal: STG: Patient will remain free from self harm Outcome: Not Progressing Patient not eating well and focused only on drinking water.

## 2015-09-19 NOTE — Plan of Care (Signed)
Problem: Alteration in mood Goal: LTG-Pt's behavior demonstrates decreased signs of depression (Patient's behavior demonstrates decreased signs of depression to the point the patient is safe to return home and continue treatment in an outpatient setting)  Outcome: Not Progressing Pt displays a flat affect, endorses SI on and off, denies at the present time, contracts for safety

## 2015-09-19 NOTE — [facility] Group Notes (Signed)
[facility] LCSW Group Therapy  09/19/2015 10:56 AM (late entry for 09/18/2015)   Type of Therapy:  Group Therapy  Participation Level:  Minimal  Participation Quality:  Attentive  Affect:  Anxious  Cognitive:  Alert  Insight:  Limited  Engagement in Therapy:  Limited  Modes of Intervention:  Discussion, Education, Socialization and Support  Summary of Progress/Problems: Feelings around Relapse. Group members discussed the meaning of relapse and shared personal stories of relapse, how it affected them and others, and how they perceived themselves during this time. Group members were encouraged to identify triggers, warning signs and coping skills used when facing the possibility of relapse. Social supports were discussed and explored in detail. [name] [name] attended group with a lot of encouragement from the mental health techs. She paced from the door to her seat for a few minutes prior to group starting. She reports feeling very anxious and wanting to return to her room. She stayed the entire time. She sat quietly and listened to other group members.   [name] L Judd Mccubbin MSW, LCSWA  09/19/2015, 10:56 AM

## 2015-09-19 NOTE — [facility] Group Notes (Signed)
[facility] Group Notes:  (Nursing/MHT/Case Management/Adjunct)  Date:  09/19/2015  Time:  10:41 PM  Type of Therapy:  Evening Wrap-up Group  Participation Level:  Did Not Attend  Participation Quality:  N/A  Affect:  N/A  Cognitive:  N/A  Insight:  None  Engagement in Group:  N/A  Modes of Intervention:  Discussion  Summary of Progress/Problems:  Tomasita Morrow 09/19/2015, 10:41 PM

## 2015-09-20 NOTE — [facility] Group Notes (Signed)
[facility] LCSW Group Therapy  09/20/2015 2:05 PM  Type of Therapy:  Group Therapy  Participation Level:  Minimal  Participation Quality:  Attentive  Affect:  Appropriate  Cognitive:  Alert  Insight:  Improving  Engagement in Therapy:  Improving  Modes of Intervention:  Discussion, Education, Socialization and Support  Summary of Progress/Problems: Balance in life: Patients will discuss the concept of balance and how it looks and feels to be unbalanced. Pt will identify areas in their life that is unbalanced and ways to become more balanced.  Pt attended group and stayed the entire time. She sat quietly and listened to other group members. She reports feeling "calm" today.   Chirag Krueger L Berlyn Malina MSW, LCSWA  09/20/2015, 2:05 PM

## 2015-09-20 NOTE — Progress Notes (Signed)
Her mood is depressed.Stated that she is anxious because she is not sure about anything.Reassurence & encouragement given to patient.Compliant with meds.Attended one group.Appetite fair.

## 2015-09-20 NOTE — Progress Notes (Signed)
[facility] MD Progress Note  09/20/2015 4:01 PM [name] [name]  MRN:  [id number]  Subjective:  [name] [name] is a 54 year old female with a history of severe depression and anxiety admitted for worsening depression, high anxiety, weight loss, and inability to care for herself. She was seen for follow-up. She reported that she is able to eat and has been taking and showed as well. Staff has recently given her Zofran as she was complaining of some nausea. The staff mentioned that they frequently give her Zofran as she complains of nausea. Patient is also dependent on lorazepam and will often requests the lorazepam for her high anxiety. She has been minimizing her eating disorder and continues to focus on Zofran and lorazepam during her time in the hospital. She is also being monitored closely by the staff. The patient has a history of posttraumatic and hyponatremia. She has sitter at bedside for fall risk and excessive drinking.   Principal Problem: Severe recurrent major depression with psychotic features [facility]) Diagnosis:   Patient Active Problem List   Diagnosis Date Noted  . Failure to thrive in adult [R62.7] 09/11/2015  . 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 Severe recurrent major depression with psychotic features (HCC) [F33.3] 09/10/2015  . Schizophrenia (HCC) [F20.9] 08/27/2015  . Anorexia nervosa [F50.00] 08/05/2015  . Hypokalemia [E87.6] 08/05/2015  . 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 PALPITATIONS [R00.2] 02/07/2010  . HERPES ZOSTER [B02.9] 02/08/2008  . MIGRAINE HEADACHE [G43.909] 02/08/2008  . 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 IBD [K52.89] 02/08/2008  . CONSTIPATION, CHRONIC [K59.09] 02/08/2008  . ANEMIA, IRON DEFICIENCY [D50.9] 01/25/2008  . 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 OSTEOARTHRITIS [M19.90] 01/25/2008  . POLYARTHRALGIA [M25.50] 01/25/2008  . OSTEOPOROSIS [M81.0] 01/25/2008   Total Time spent with patient: 20  minutes  Past Psychiatric History: Severe depression and anxiety  Past Medical History:  Past Medical History  Diagnosis Date  . Ulcerative colitis (HCC)   . Osteoporosis   . Malnutrition (HCC)   . Alcoholism in remission [facility])     Past Surgical History  Procedure Laterality Date  . Breast biopsy Bilateral   . Colonoscopy  1993    crohn's disease   Family History:  Family History  Problem Relation Age of Onset  . Colon cancer    . Diabetes    . Hypothyroidism     Family Psychiatric  History: Father with similar problems.   Social History:  History  Alcohol Use No     History  Drug Use No    Social History   Social History  . Marital Status: Single    Spouse Name: N/A  . Number of Children: N/A  . Years of Education: N/A   Social History Main Topics  . Smoking status: Never Smoker   . Smokeless tobacco: None  . Alcohol Use: No  . Drug Use: No  . Sexual Activity: No   Other Topics Concern  . None   Social History Narrative   Additional Social History:                         Sleep: Good  Appetite:  Fair  Current Medications: Current Facility-Administered Medications  Medication Dose Route Frequency Provider Last Rate Last Dose  . acetaminophen (TYLENOL) tablet 650 mg  650 mg Oral Q6H PRN Audery Amel, MD   650 mg at 09/18/15 0636  . alum & mag hydroxide-simeth (MAALOX/MYLANTA) 200-200-20 MG/5ML suspension 30 mL  30 mL Oral Q4H PRN Audery Amel, MD   30 mL at 09/14/15 1126  . docusate sodium (COLACE) capsule 100 mg  100 mg Oral BID Shari Prows, MD   100 mg at 09/20/15 0934  . feeding supplement (ENSURE ENLIVE) (ENSURE ENLIVE) liquid 237 mL  237 mL Oral TID BM Audery Amel, MD   237 mL at 09/20/15 0936  . fluvoxaMINE (LUVOX) tablet 100 mg  100 mg Oral QHS Shari Prows, MD   100 mg at 09/19/15 2056  . LORazepam (ATIVAN) tablet 0.5 mg  0.5 mg Oral BID PRN Shari Prows, MD   0.5 mg at 09/20/15 1116  . LORazepam  (ATIVAN) tablet 1 mg  1 mg Oral QHS Jolanta B Pucilowska, MD   1 mg at 09/19/15 2057  . magnesium hydroxide (MILK OF MAGNESIA) suspension 30 mL  30 mL Oral Daily PRN Audery Amel, MD      . megestrol (MEGACE) tablet 40 mg  40 mg Oral Daily Jolanta B Pucilowska, MD   40 mg at 09/20/15 0934  . metoprolol tartrate (LOPRESSOR) tablet 25 mg  25 mg Oral Daily Kerin Salen, MD   25 mg at 09/20/15 0934  . mirtazapine (REMERON) tablet 30 mg  30 mg Oral QHS Shari Prows, MD   30 mg at 09/19/15 2057  . ondansetron (ZOFRAN) tablet 4 mg  4 mg Oral Q8H PRN Shari Prows, MD   4 mg at 09/20/15 0751  . oxymetazoline (AFRIN) 0.05 % nasal spray 1 spray  1 spray Each Nare BID Kerin Salen, MD   1 spray at 09/20/15 0934  . pantoprazole (PROTONIX) EC tablet 40 mg  40 mg Oral Daily Shari Prows, MD   40 mg at 09/20/15 0934  . prenatal multivitamin tablet 1 tablet  1 tablet Oral Q1200 Shari Prows, MD   1 tablet at 09/20/15 1115  . QUEtiapine (SEROQUEL XR) 24 hr tablet 200 mg  200 mg Oral QHS Audery Amel, MD   200 mg at 09/19/15 2056  . senna (SENOKOT) tablet 8.6 mg  1 tablet Oral Daily Jolanta B Pucilowska, MD   8.6 mg at 09/20/15 0934    Lab Results: No results found for this or any previous visit (from the past 48 hour(s)).  Physical Findings: AIMS:  , ,  ,  ,    CIWA:    COWS:     Musculoskeletal: Strength & Muscle Tone: within normal limits Gait & Station: normal Patient leans: N/A  Psychiatric Specialty Exam: Review of Systems  Gastrointestinal: Positive for constipation.  Psychiatric/Behavioral: Positive for depression. The patient is nervous/anxious.   All other systems reviewed and are negative.   Blood pressure 162/99, pulse 139, temperature 98.4 F (36.9 C), temperature source Oral, resp. rate 20, height  (1.6 m), weight 78 lb (35.381 kg), SpO2 98 %.Body mass index is 13.82 kg/(m^2).  General Appearance: Casual  Eye Contact::  Good  Speech:   Normal Rate  Volume:  Normal  Mood:  Anxious  Affect:  Flat  Thought Process:  Goal Directed  Orientation:  Full (Time, Place, and Person)  Thought Content:  Delusions and Paranoid Ideation  Suicidal Thoughts:  No  Homicidal Thoughts:  No  Memory:  Immediate;   Fair Recent;   Fair Remote;   Fair  Judgement:  Impaired  Insight:  Lacking  Psychomotor Activity:  Decreased  Concentration:  Fair  Recall:  [employer]  of Knowledge:Fair  Language: Fair  Akathisia:  No  Handed:  Right  AIMS (if indicated):     Assets:  Communication Skills Desire for Improvement Financial Resources/Insurance Housing Social Support  ADL's:  Intact  Cognition: WNL  Sleep:  Number of Hours: 7   Treatment Plan Summary: Daily contact with patient to assess and evaluate symptoms and progress in treatment and Medication management   [name] [name] is a 54 year old female with a history of psychotic depression and failure to thrive admitted after suicide attempt by medication overdose in the context of major loss.  1. Suicidal ideation. She still has passive suicidal thoughts but is able to contract for safety in the hospital  2. Mood and psychosis. She was restarted on Seroquel and Remeron for depression and mood stabilization. We will lower Remeron 30 mg at bedtime to address restlessness. We increased Luvox to 100 mg for depression and anxiety. Discussed with her about the adverse effects of the medication and she demonstrated understanding.  3. Constipation. She is on bowel regimen.  4. Failure to thrive. He weighs 66 pounds only. During her previous admission she weighed 73 pounds. Her normal weight is 90 pounds. We started Megace and Ensure Plus. Dietary consult is appreciated. Her current weight is 78 lbs.    5. Tachycardia. We restarted Lopressor 25 mg daily for tachycardia.   6. ECT consult. The patient is not a good candidate for ECT as she has improved dramatically.   7. Fall risk. PT consult  completed. Patient refuses to use a walker.  discontinue sitter.  8. Hypophosphatemia. Patient's phosphorous was replenished. Medicine and pharmacy consult is greatly appreciated.   9. Nasal congestion-we'll start Afrin which patient states she is used in the past.  10. Nausea. Resolved. She has Zofran available as needed. We started Protonix as well.  11. Anxiety. We restarted Ativan 0.5 mg twice daily as needed for anxiety with additional dose at bedtime. Will offer low dose Seroquel during the day. Will allow treatment team to make adjustment to her psychotropic medications at this time.  12. UTI. She received a single dose on Manurol.   13. Potomania. She has a history of excessive water intake and severe hyponatremia. Electroyites are normal. She is on fluid restriction.  14. Disposition. TBE.    Brandy Hale 09/20/2015, 4:01 PM

## 2015-09-21 MED ORDER — QUETIAPINE FUMARATE ER 200 MG PO TB24: 200.0000 mg | ORAL_TABLET | Freq: Every day | ORAL | Status: AC

## 2015-09-21 MED ORDER — LORAZEPAM 1 MG PO TABS: 0.5000 mg | ORAL_TABLET | Freq: Two times a day (BID) | ORAL | Status: AC

## 2015-09-21 MED ORDER — PANTOPRAZOLE SODIUM 40 MG PO TBEC: 40.0000 mg | DELAYED_RELEASE_TABLET | Freq: Every day | ORAL | Status: AC

## 2015-09-21 MED ORDER — MIRTAZAPINE 30 MG PO TABS: 30.0000 mg | ORAL_TABLET | Freq: Every day | ORAL | Status: AC

## 2015-09-21 MED ORDER — DOCUSATE SODIUM 100 MG PO CAPS: 100.0000 mg | ORAL_CAPSULE | Freq: Two times a day (BID) | ORAL | Status: AC

## 2015-09-21 MED ORDER — METOPROLOL SUCCINATE ER 25 MG PO TB24: 50.0000 mg | ORAL_TABLET | Freq: Every day | ORAL | Status: AC

## 2015-09-21 MED ORDER — ONDANSETRON HCL 4 MG PO TABS: 4.0000 mg | ORAL_TABLET | Freq: Three times a day (TID) | ORAL | Status: AC | PRN

## 2015-09-21 MED ORDER — FLUVOXAMINE MALEATE 100 MG PO TABS: 100.0000 mg | ORAL_TABLET | Freq: Every day | ORAL | Status: AC

## 2015-09-21 MED ORDER — METOPROLOL TARTRATE 25 MG PO TABS: 25.0000 mg | ORAL_TABLET | Freq: Two times a day (BID) | ORAL | Status: DC

## 2015-09-21 MED ORDER — PRENATAL MULTIVITAMIN CH: 1.0000 | ORAL_TABLET | Freq: Every day | ORAL | Status: AC

## 2015-09-21 MED ORDER — LORAZEPAM 1 MG PO TABS: 1.0000 mg | ORAL_TABLET | Freq: Every day | ORAL | Status: AC

## 2015-09-21 MED ORDER — MEGESTROL ACETATE 40 MG PO TABS: 40.0000 mg | ORAL_TABLET | Freq: Every day | ORAL | Status: AC

## 2015-09-21 NOTE — Progress Notes (Signed)
Pt.  noted to be anxious about the brother picking is her up on time. Pt. Was reassured at least three times that staff would call her when the brother arrived. Pt.'s discharge medications and instructions discussed with pt. And she verbalized understanding. Pt.'s belongings given to pt. Prior to leaving and she did not voice any concerns at the time of d/c. No distress noted.

## 2015-09-21 NOTE — [facility] Group Notes (Signed)
[facility] Group Notes:  (Nursing/MHT/Case Management/Adjunct)  Date:  09/21/2015  Time:  1:39 PM  Type of Therapy:  Psychoeducational Skills  Participation Level:  None  Participation Quality:  Attentive  Affect:  Flat  Cognitive:  Alert  Insight:  Improving  Engagement in Group:  None  Modes of Intervention:  Clarification, Discussion and Education  Summary of Progress/Problems:  Marquette Old 09/21/2015, 1:39 PM

## 2015-09-21 NOTE — Plan of Care (Signed)
Problem: [facility] Participation in Recreation Therapeutic Interventions Goal: STG-Patient will demonstrate improved self esteem by identif STG: Self-Esteem - Within 4 treatment sessions, patient will verbalize at least 5 positive affirmation statements in each of 2 treatment sessions to increase self-esteem post d/c.  Outcome: Completed/Met Date Met:  09/21/15 Treatment Session 2; Completed 2 out of 2: At approximately 12:00 pm, LRT met with patient in patient room. Patient verbalized 5 positive affirmation statements. Patient reported it felt "better". LRT encouraged patient to continue saying positive affirmation statements.  Intervention Used: I Am statements  Leonette Monarch, LRT/CTRS 11.07.16 4:36 pm Goal: STG-Other Recreation Therapy Goal (Specify) STG: Stress Management - Within 4 treatment sessions, patient will verbalize understanding of the stress management techniques in each of 2 treatment sessions to increase stress management skills post d/c.  Outcome: Completed/Met Date Met:  09/21/15 Treatment Session 2; Completed 2 out of 2: At approximately 12:00 pm, LRT met with patient in patient room. Patient reported she read over the stress management techniques. Patient verbalized understanding. Intervention Used: Mindfulness & Progressive Muscle Relaxation handouts  Leonette Monarch, LRT/CTRS 11.07.16 4:38 pm

## 2015-09-21 NOTE — Progress Notes (Signed)
D: Patient denies SI/HI/AVH.  Patient affect is depressed and her mood is anxious.  Patient did NOT attend evening group. Patient remained in her room throughout the shift. No distress noted. A: Support and encouragement offered. Scheduled medications given to pt. Q 15 min checks continued for patient safety. R: Patient receptive. Patient remains safe on the unit.
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
## 2015-09-21 NOTE — [facility] Suicide Risk Assessment (Signed)
[facility] INPATIENT:  Family/Significant Other Suicide Prevention Education  Suicide Prevention Education:  Education Completed; Janeya Deyo (brother) [phone] as been identified by the patient as the family member/significant other with whom the patient will be residing, and identified as the person(s) who will aid the patient in the event of a mental health crisis (suicidal ideations/suicide attempt).  With written consent from the patient, the family member/significant other has been provided the following suicide prevention education, prior to the and/or following the discharge of the patient.  The suicide prevention education provided includes the following:  Suicide risk factors  Suicide prevention and interventions  National Suicide Hotline telephone number  [facility] assessment telephone number  [facility] Emergency Assistance 911  [facility] and/or Residential Mobile Crisis Unit telephone number  Request made of family/significant other to:  Remove weapons (e.g., guns, rifles, knives), all items previously/currently identified as safety concern.    Remove drugs/medications (over-the-counter, prescriptions, illicit drugs), all items previously/currently identified as a safety concern.  The family member/significant other verbalizes understanding of the suicide prevention education information provided.  The family member/significant other agrees to remove the items of safety concern listed above.  Lulu Riding, MSW, LCSWA 09/21/2015, 11:02 AM

## 2015-09-21 NOTE — Progress Notes (Signed)
  [facility] Adult Case Management Discharge Plan :  Will you be returning to the same living situation after discharge:  Yes,  home with brother/guardian [name] [name] At discharge, do you have transportation home?: Yes,  patient's brother/guardian will pick up Do you have the ability to pay for your medications: Yes,  patient has Medicare and is awaiting Medicaid approval  Release of information consent forms completed and in the chart;  Patient's signature needed at discharge.  Patient to Follow up at: Follow-up Information    Follow up with [facility]. Go on 09/23/2015.   Why:  For follow-up care appt on Wednesday 09/23/15 walk in hours 8-3   Contact information:   [address] [place], [place] Ph [phone] Fax [phone]      Next level of care provider has access to [facility] Link:no  Patient denies SI/HI: Yes,  patient denies SI/HI    Safety Planning and Suicide Prevention discussed: Yes,  SPE discussed with patient and her brother/guardian [name] [name] [phone]  Have you used any form of tobacco in the last 30 days? (Cigarettes, Smokeless Tobacco, Cigars, and/or Pipes): No  Has patient been referred to the Quitline?: N/A patient is not a smoker  Beryl Meager T, MSW, LCSWA 09/21/2015, 11:04 AM

## 2015-09-21 NOTE — Progress Notes (Signed)
Recreation Therapy Notes  INPATIENT RECREATION TR PLAN  Patient Details Name: [name] [name] MRN: [id number] DOB: 13-Sep-1961 Today's Date: 09/21/2015  Rec Therapy Plan Is patient appropriate for Therapeutic Recreation?: Yes Treatment times per week: At least once a week TR Treatment/Interventions: 1:1 session, Group participation (Comment) (Appropriate participation in daily recreation therapy tx)  Discharge Criteria Pt will be discharged from therapy if:: Treatment goals are met, Discharged Treatment plan/goals/alternatives discussed and agreed upon by:: Patient/family  Discharge Summary Short term goals set: See Care Plan Short term goals met: Complete Progress toward goals comments: One-to-one attended Which groups?: Leisure education, Other (Comment) (Self-expression) One-to-one attended: Self-esteem, stress management Reason goals not met: N/A Therapeutic equipment acquired: None Reason patient discharged from therapy: Discharge from hospital Pt/family agrees with progress & goals achieved: Yes Date patient discharged from therapy: 09/21/15   Leonette Monarch, LRT/CTRS 09/21/2015, 4:39 PM
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
## 2015-10-15 DEATH — deceased
